# Patient Record
Sex: Female | Born: 1945 | Race: White | Hispanic: No | Smoking: Never smoker
Health system: Southern US, Community
[De-identification: ages and names within clinical notes are randomized; demographics above are authoritative.]

## PROBLEM LIST (undated history)

## (undated) DIAGNOSIS — K219 Gastro-esophageal reflux disease without esophagitis: Secondary | ICD-10-CM

## (undated) DIAGNOSIS — J309 Allergic rhinitis, unspecified: Secondary | ICD-10-CM

## (undated) DIAGNOSIS — N189 Chronic kidney disease, unspecified: Secondary | ICD-10-CM

## (undated) DIAGNOSIS — I890 Lymphedema, not elsewhere classified: Secondary | ICD-10-CM

## (undated) DIAGNOSIS — F32A Depression, unspecified: Secondary | ICD-10-CM

## (undated) DIAGNOSIS — Z860100 Personal history of colon polyps, unspecified: Secondary | ICD-10-CM

## (undated) DIAGNOSIS — E039 Hypothyroidism, unspecified: Secondary | ICD-10-CM

## (undated) DIAGNOSIS — I878 Other specified disorders of veins: Secondary | ICD-10-CM

## (undated) DIAGNOSIS — I10 Essential (primary) hypertension: Secondary | ICD-10-CM

## (undated) DIAGNOSIS — E785 Hyperlipidemia, unspecified: Secondary | ICD-10-CM

## (undated) DIAGNOSIS — M1990 Unspecified osteoarthritis, unspecified site: Secondary | ICD-10-CM

## (undated) DIAGNOSIS — E669 Obesity, unspecified: Secondary | ICD-10-CM

## (undated) DIAGNOSIS — Z8719 Personal history of other diseases of the digestive system: Secondary | ICD-10-CM

## (undated) HISTORY — PX: CHOLECYSTECTOMY: SHX55

## (undated) HISTORY — PX: ABDOMINAL HYSTERECTOMY: SHX81

## (undated) HISTORY — PX: JOINT REPLACEMENT: SHX530

## (undated) HISTORY — PX: EYE SURGERY: SHX253

## (undated) HISTORY — PX: TONSILLECTOMY: SUR1361

## (undated) HISTORY — PX: ANKLE ARTHROSCOPY: SUR85

## (undated) HISTORY — PX: TUBAL LIGATION: SHX77

## (undated) HISTORY — PX: OTHER SURGICAL HISTORY: SHX169

## (undated) HISTORY — PX: CARPAL TUNNEL RELEASE: SHX101

## (undated) HISTORY — PX: BACK SURGERY: SHX140

## (undated) HISTORY — PX: TOTAL SHOULDER REPLACEMENT: SUR1217

---

## 2005-03-11 ENCOUNTER — Ambulatory Visit: Payer: Self-pay | Admitting: *Deleted

## 2005-08-13 ENCOUNTER — Ambulatory Visit: Payer: Self-pay | Admitting: Internal Medicine

## 2006-09-15 ENCOUNTER — Ambulatory Visit: Payer: Self-pay | Admitting: Internal Medicine

## 2007-04-14 ENCOUNTER — Ambulatory Visit: Payer: Self-pay | Admitting: Gastroenterology

## 2008-04-14 ENCOUNTER — Ambulatory Visit: Payer: Self-pay | Admitting: Internal Medicine

## 2009-04-19 ENCOUNTER — Ambulatory Visit: Payer: Self-pay | Admitting: Internal Medicine

## 2009-04-26 ENCOUNTER — Ambulatory Visit: Payer: Self-pay | Admitting: Internal Medicine

## 2010-05-04 ENCOUNTER — Ambulatory Visit: Payer: Self-pay | Admitting: Physician Assistant

## 2010-05-18 ENCOUNTER — Ambulatory Visit: Payer: Self-pay | Admitting: Internal Medicine

## 2011-05-24 ENCOUNTER — Ambulatory Visit: Payer: Self-pay | Admitting: Internal Medicine

## 2011-06-19 ENCOUNTER — Ambulatory Visit: Payer: Self-pay | Admitting: Internal Medicine

## 2012-06-10 ENCOUNTER — Ambulatory Visit: Payer: Self-pay | Admitting: Internal Medicine

## 2013-05-13 ENCOUNTER — Ambulatory Visit: Payer: Self-pay | Admitting: Gastroenterology

## 2013-06-24 ENCOUNTER — Ambulatory Visit: Payer: Self-pay | Admitting: Internal Medicine

## 2014-07-18 ENCOUNTER — Ambulatory Visit: Payer: Self-pay | Admitting: Internal Medicine

## 2014-12-02 HISTORY — PX: EYE SURGERY: SHX253

## 2015-04-28 DIAGNOSIS — I890 Lymphedema, not elsewhere classified: Secondary | ICD-10-CM | POA: Insufficient documentation

## 2015-05-15 ENCOUNTER — Encounter: Payer: Self-pay | Admitting: Occupational Therapy

## 2015-05-15 ENCOUNTER — Ambulatory Visit: Payer: Medicare Other | Attending: Internal Medicine | Admitting: Occupational Therapy

## 2015-05-15 VITALS — Wt 217.0 lb

## 2015-05-15 DIAGNOSIS — I890 Lymphedema, not elsewhere classified: Secondary | ICD-10-CM

## 2015-05-15 NOTE — Therapy (Signed)
[facility] MAIN [facility] SERVICES [address] [place], [place], [address] Phone: [phone]   Fax:  [phone]  Occupational Therapy Evaluation  Patient Details  Name: Janet Galvan MRN: [id number] Date of Birth: 1946/03/05 Referring Provider:  Marguarite Arbour, MD  Encounter Date: 05/15/2015      OT End of Session - 05/15/15 1854    Visit Number 1   Number of Visits 36   Date for OT Re-Evaluation 08/13/15   OT Start Time 1300   OT Stop Time 1445   OT Time Calculation (min) 105 min   Activity Tolerance Patient tolerated treatment well   Behavior During Therapy [facility] for tasks assessed/performed      History reviewed. No pertinent past medical history.  History reviewed. No pertinent past surgical history.  Filed Vitals:   05/15/15 1331  Weight: 217 lb (98.431 kg)    Visit Diagnosis:  Lymphedema - Plan: Ot plan of care cert/re-cert      Subjective Assessment - 05/15/15 1846    Subjective  Pt reports onset of BLE sweling in Jan 2016 soon after her daughter passed away. Pt reports LLE swelling is worse than R. It limits her ability to complete basic and instrumental ADLs, limits functional moility, standing adn walking, and limits her participation in preferred social and community activities and leisure pursuits.   Patient Stated Goals  do the things I want to do.   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 stemmer is mildly positive on L. 2+ pitting noted at distal legs medially. no hemosiderine stain observed. No hypersendsativity to palpation   Edema   Edema mild, L>R          LYMPHEDEMA/ONCOLOGY QUESTIONNAIRE - 05/15/15 1852    What other symptoms do you have   Are you Having Heaviness or Tightness Yes   Are you having pitting edema Yes   Is it Hard or Difficult finding clothes that fit Yes   Stemmer Sign Yes   Lymphedema Stage   Stage STAGE 2 SPONTANEOUSLY IRREVERSIBLE                       OT Education - 05/15/15 1853    Education provided Yes   Education Details etiology, progression of lymphedema, treatment protocols and precautions   Person(s) Educated Patient   Methods Explanation;Demonstration;Tactile cues;Verbal cues;Handout   Comprehension Verbalized understanding;Verbal cues required;Need further instruction             OT Long Term Goals - 05/15/15 1906    OT LONG TERM GOAL #1   Title Pt independent and 100% with all Intensive/Management phase LE  self care protocols, including compression wrapping/ garment wear and care, lymphatic pumping ther ex, skin care, ther ex, by DC to limit infection risk , LE progression and further functional decline.   Baseline dependent   Time 12   Period Weeks   Status New   OT LONG TERM GOAL #2   Title Pt able to don/doff compression garments using assistive devices PRN on issue date, by DC to limit infection risk , LE progression and further functional decline.   Baseline dependent   Time 12   Period Weeks   OT LONG TERM GOAL #3   Title RLE limb volume reduction achieved by end of intensive as evidenced by 2 cm decrease at all calf landmarks.   Baseline dependent   Time 12   Period Weeks   Status New   OT LONG TERM GOAL #4   Title Skin  integrity to improve by palpable reduction in tissue density and resolution of pitting is medically necessary to limit infection risk and LE progression.   Baseline dependent   Time 12   Period Weeks   Status New   OT LONG TERM GOAL #5   Baseline dependent               Plan - 2015-06-02 1856    Clinical Impression Statement Pt presents with mild, stage 2 , BLELymphedema 2/2 suspected CVI and obesity, which limits B/IADL performance, social participation and community participation. Without skilled Occupational therapy for Complete Decongestive Therapy pt's condition is expected to worsen and progress leading to further functional decline.   Pt will benefit from skilled therapeutic intervention in order to improve on the following deficits (Retired) Decreased knowledge of precautions;Decreased knowledge of use of DME;Decreased mobility;Decreased skin integrity;Impaired perceived functional ability;Increased edema;Difficulty walking;Pain   Rehab Potential Good   OT Frequency 3x / week   OT Duration 12 weeks   OT Treatment/Interventions Self-care/ADL training;Compression bandaging;DME and/or AE instruction;Patient/family education;Other (comment);Therapeutic exercise;Manual Therapy;Manual lymph drainage   Plan fit with BLE custom flat knit Elavrex compression knee highs for daytime and consider BLE custom convoluted foam boots for HOS   OT Home Exercise Plan 2 x daily, 2 sets 10- all in sequence Lymphatic pumping   Consulted and Agree with Plan of Care Patient          G-Codes - 2015/06/02 1903    Functional Assessment Tool Used clinical observation, physical assessment, interview   Functional Limitation Self care   Self Care Current Status (W0981) At least 80 percent but less than 100 percent impaired, limited or restricted   Self Care Goal Status (X9147) At least 20 percent but less than 40 percent impaired, limited or restricted      Problem List There are no active problems to  display for this patient.  Loel Dubonnet, MS, OTR/L, CLT-LANA 2015/06/02 7:14 PM   02-Jun-2015, 7:14 PM  [place] [facility] MAIN [facility] SERVICES [address] [place], [place], [address] Phone: [phone]   Fax:  [phone]

## 2015-05-15 NOTE — Patient Instructions (Signed)
Preliminary Lymphedema Instructions/Precautions:  1. PRECAUTIONS: If you experience atypical shortness of breath, or notice any signs /symptoms of skin infection ( aka cellulitis) contact you physician immediately.  2. SKIN: Carefully monitor skin condition and perform impeccable hygiene daily. Bathe skin with mild soap and water and apply low pH lotion (aka Eucerin) to improve hydration and limit infection risk 3 x q d  3.Elevate your legs and feet to the level of your heart whenever you are sitting down.      

## 2015-05-17 ENCOUNTER — Ambulatory Visit: Payer: Medicare Other | Admitting: Occupational Therapy

## 2015-05-17 DIAGNOSIS — I890 Lymphedema, not elsewhere classified: Secondary | ICD-10-CM

## 2015-05-17 NOTE — Patient Instructions (Signed)
Lymphedema Care Instructions/Precautions  1. PRECAUTIONS: If you experience atypical shortness of breath, or notice any signs /symptoms of skin infection ( aka cellulitis) remove all compression wraps/ garments, discontinue manual lymphatic drainage (MLD),  and contact you physician immediately.  2. SKIN: Carefully monitor skin condition and perform impeccable hygiene daily. Bathe skin with mild soap and water and apply low pH lotion (aka Eucerin) to improve hydration and limit infection risk.  3. LYMPHATIC PUMPING EXERCISE: At least twice a day complete 2 sets of 10  of each exercise for each leg/arm. It's important to perform exercises in order. OMIT PARTIAL SIT UP  4. SELF MLD: Perform simple self MLD as directed daily.  5.COMPRESSION: Building tolerance may take time and practice, so don't get discouraged. Wraps are to be worn 23/7 during the Intensive Phase of lymphedema treatment, which is called Complete Decongestive Therapy (CDT). If bandages begin to feel tight during periods of inactivity and/or during the night, try performing your exercises to loosen them.   6. Elevate your legs and feet to the level of your heart whenever you are sitting down.          

## 2015-05-17 NOTE — Therapy (Signed)
[place] [facility] MAIN [facility] SERVICES [address] [place], [place], [address] Phone: [phone]   Fax:  [phone]  Occupational Therapy Treatment  Patient Details  Name: Janet Galvan MRN: [id number] Date of Birth: 01-25-1946 Referring Provider:  Marguarite Arbour, MD  Encounter Date: 05/17/2015      OT End of Session - 05/17/15 1722    Visit Number 2   Number of Visits 36   Date for OT Re-Evaluation 08/13/15   OT Start Time 1440   OT Stop Time 1555   OT Time Calculation (min) 75 min   Activity Tolerance Patient tolerated treatment well   Behavior During Therapy [facility] for tasks assessed/performed      No past medical history on file.  No past surgical history on file.  There were no vitals filed for this visit.  Visit Diagnosis:  Lymphedema      Subjective Assessment - 05/17/15 1707    Subjective  Pt presents for day 1 of Intensive Phase CDT to BLE w/ Rx commencing to LLE first.Pt is accompanied by her supportive ault daughter, Marchelle Folks, who states she will attend visits and assist w/ compression wrapping when she's able. Pt is eager to begin CDT.   Patient is accompained by: Family member   Currently in Pain? No/denies                      OT Treatments/Exercises (OP) - 05/17/15 0001    Manual Therapy   Manual Therapy Edema management;Manual Lymphatic Drainage (MLD);Compression Bandaging;Other (comment)   Manual therapy comments Completed  initial comparative BLE limb volumetrics and circumferential measurements   Compression Bandaging LLE knee length gradient wraps applied circumferentially: light toe wrapp, 10 cm x 1 Rosidol over stockinette from A to D, then short stretch (SS) LoPress 8 cm x 1 and  10 cm x 2  Ato D   Other Manual Therapy no skin care today, but discussed recommendation for low pH lotion and careful hygiene to limit infection risk                OT Education - 05/17/15 1720    Education  provided Yes   Education Details implemented skilled pt edu for LE self care components, including gradient compression wrapping and lymphatic pumping ther ex.   Person(s) Educated Patient;Child(ren)   Methods Explanation;Demonstration;Tactile cues;Verbal cues;Handout   Comprehension Verbalized understanding;Verbal cues required;Tactile cues required;Need further instruction             OT Long Term Goals - 05/15/15 1906    OT LONG TERM GOAL #1   Title Pt independent and 100% with all Intensive/Management phase LE self care protocols, including compression wrapping/ garment wear and care, lymphatic pumping ther ex, skin care, ther ex, by DC to limit infection risk , LE progression and further functional decline.   Baseline dependent   Time 12   Period Weeks   Status New   OT LONG TERM GOAL #2   Title Pt able to don/doff compression garments using assistive devices PRN on issue date, by DC to limit infection risk , LE progression and further functional decline.   Baseline dependent   Time 12   Period Weeks   OT LONG TERM GOAL #3   Title RLE limb volume reduction achieved by end of intensive as evidenced by 2 cm decrease at all calf landmarks.   Baseline dependent   Time 12   Period Weeks   Status New  OT LONG TERM GOAL #4   Title Skin integrity to improve by palpable reduction in tissue density and resolution of pitting is medically necessary to limit infection risk and LE progression.   Baseline dependent   Time 12   Period Weeks   Status New   OT LONG TERM GOAL #5   Baseline dependent               Plan - 05/17/15 1724    Clinical Impression Statement Pt tolerated OT session well today. Pt less tearful and able to focus on skilled edu for LE self care protocols with minimal redirection.    Pt will benefit from skilled therapeutic intervention in order to improve on the following deficits (Retired) Decreased knowledge of precautions;Decreased knowledge of use of  DME;Decreased mobility;Decreased skin integrity;Impaired perceived functional ability;Increased edema;Difficulty walking;Pain   Rehab Potential Good   OT Treatment/Interventions Self-care/ADL training;Compression bandaging;DME and/or AE instruction;Patient/family education;Other (comment);Therapeutic exercise;Manual Therapy;Manual lymph drainage   Plan Pt to commence self wrapping next session with waxing assistance until independent using proper techniques. reveiew LE ther ex and skin care protocols. Into simple self MLD stroke next visit if time allows.   OT Home Exercise Plan Review next visit- 2 sets 10 bilaterally 2 x  daily        Problem List There are no active problems to display for this patient.   Loel Dubonnet, MS, OTR/L, CLT-LANA 05/17/2015 5:33 PM  05/17/2015, 5:33 PM  [place] [facility] MAIN [facility] SERVICES [address] [place], [place], [address] Phone: [phone]   Fax:  [phone]

## 2015-05-19 ENCOUNTER — Ambulatory Visit: Payer: Medicare Other | Admitting: Occupational Therapy

## 2015-05-19 DIAGNOSIS — I890 Lymphedema, not elsewhere classified: Secondary | ICD-10-CM | POA: Diagnosis not present

## 2015-05-19 NOTE — Therapy (Signed)
[facility] MAIN [facility] SERVICES [address] [place], [place], [address] Phone: [phone]   Fax:  [phone]  Occupational Therapy Treatment  Patient Details  Name: Janet Galvan MRN: [id number] Date of Birth: July 14, 1946 Referring Provider:  Marguarite Arbour, MD  Encounter Date: 05/19/2015      OT End of Session - 05/19/15 1641    Visit Number 3   Number of Visits 36   Date for OT Re-Evaluation 08/13/15   OT Start Time 0235   OT Stop Time 0430   OT Time Calculation (min) 115 min   Activity Tolerance Patient tolerated treatment well   Behavior During Therapy [facility] for tasks assessed/performed      No past medical history on file.  No past surgical history on file.  There were no vitals filed for this visit.  Visit Diagnosis:  Lymphedema      Subjective Assessment - 05/19/15 1634    Subjective  Pt presents for day 2 of Intensive Phase CDT to BLE w/ Rx commencing to LLE first. Pt returns wearing knee length compression stockings. Pt states other than initial discomfort on top of her foot in wraps the first time, she tolerated compression without difficulty since last visit. Pt reports she removed wraps yesterday afternoon efore taking a shower.    Patient is accompained by: Family member   Patient Stated Goals  do the things I want to do.   Pain Onset More than a month ago                      OT Treatments/Exercises (OP) - 05/19/15 0001    ADLs   ADL Comments Pt reports she performed lymphatic pumping ther ex as directed during visit interval without difficulty. Commenced intro level Pt edu for self wrapping compression bandages today. Pt needed max assist to apply foam, artiflex and for wrap.   Manual Therapy   Manual Therapy Edema management;Manual Lymphatic Drainage (MLD);Compression Bandaging;Other (comment)  skin care w/ low pH Eucerin lotion before applying fresh wra   Manual Lymphatic Drainage (MLD)  Manual lymph drainage (MLD) in supine utilizing functional inguinal lymph nodes and deep abdominal lymphatics as is customary for non-cancer related lower extremity LE, including bilateral "short neck" sequence, deep abdominal pathways, functional inguinal LN, lower extremity proximal to distal w/ emphasis on medial knee bottleneck and politeal LN. Performed fibrosis technique to B maleoli and distal posterior leg to address fatty fibrosis. Good tolerance.   Compression Bandaging LLE knee length gradient wraps applied circumferentially: light toe wrapp, 10 cm x 1 Rosidol over stockinette from A to D, then short stretch (SS) LoPress 8 cm x 1 and  10 cm x 2  Ato D                OT Education - 05/19/15 1640    Education provided Yes   Education Details commenced edu for self compression wrapping. Pt needed max assist to apply first 2 wraps. Pt unable to appy toe wraps because she is unable to reach her toes.   Methods Explanation;Demonstration;Tactile cues;Verbal cues;Handout   Comprehension Verbalized understanding;Returned demonstration;Verbal cues required;Tactile cues required;Need further instruction             OT Long Term Goals - 05/15/15 1906    OT LONG TERM GOAL #1   Title Pt independent and 100% with all Intensive/Management phase LE self care protocols, including compression wrapping/ garment wear and care, lymphatic pumping ther  ex, skin care, ther ex, by DC to limit infection risk , LE progression and further functional decline.   Baseline dependent   Time 12   Period Weeks   Status New   OT LONG TERM GOAL #2   Title Pt able to don/doff compression garments using assistive devices PRN on issue date, by DC to limit infection risk , LE progression and further functional decline.   Baseline dependent   Time 12   Period Weeks   OT LONG TERM GOAL #3   Title RLE limb volume reduction achieved by end of intensive as evidenced by 2 cm decrease at all calf landmarks.    Baseline dependent   Time 12   Period Weeks   Status New   OT LONG TERM GOAL #4   Title Skin integrity to improve by palpable reduction in tissue density and resolution of pitting is medically necessary to limit infection risk and LE progression.   Baseline dependent   Time 12   Period Weeks   Status New   OT LONG TERM GOAL #5   Baseline dependent               Plan - 05/19/15 1642    Clinical Impression Statement To date Pt is tolerating all aspects of Intensive Phase Complete Decongestive Therapy (CDT) for Lymphedema (LE) care without difficulty, and She demonstrates progress towards swelling reduction, skin care and self care goals. Limb density skin tightness is palpably unchanged sine commencing .Congestion ifeels quite solid.  Pt is pleased with her progress so far and actively participates in all aspects of care.. Cont as per POC   Rehab Potential Good   Clinical Impairments Affecting Rehab Potential back pain-chronic   OT Treatment/Interventions Self-care/ADL training;Compression bandaging;DME and/or AE instruction;Patient/family education;Other (comment);Therapeutic exercise;Manual Therapy;Manual lymph drainage   OT Home Exercise Plan continue w/ wrwp edu next session        Problem List There are no active problems to display for this patient.   Loel Dubonnet, MS, OTR/L, CLT-LANA 05/19/2015 4:48 PM   05/19/2015, 4:48 PM  [place] [facility] MAIN [facility] SERVICES [address] [place], [place], [address] Phone: [phone]   Fax:  [phone]

## 2015-05-19 NOTE — Patient Instructions (Signed)
Lymphedema Care Instructions/Precautions  1. PRECAUTIONS: If you experience atypical shortness of breath, or notice any signs /symptoms of skin infection ( aka cellulitis) remove all compression wraps/ garments, discontinue manual lymphatic drainage (MLD),  and contact you physician immediately.  2. SKIN: Carefully monitor skin condition and perform impeccable hygiene daily. Bathe skin with mild soap and water and apply low pH lotion (aka Eucerin) to improve hydration and limit infection risk.  3. LYMPHATIC PUMPING EXERCISE: At least twice a day complete 2 sets of 10  of each exercise for each leg/arm. It's important to perform exercises in order. OMIT PARTIAL SIT UP  4. SELF MLD: Perform simple self MLD as directed daily.  5.COMPRESSION: Building tolerance may take time and practice, so don't get discouraged. Wraps are to be worn 23/7 during the Intensive Phase of lymphedema treatment, which is called Complete Decongestive Therapy (CDT). If bandages begin to feel tight during periods of inactivity and/or during the night, try performing your exercises to loosen them.   6. Elevate your legs and feet to the level of your heart whenever you are sitting down.          

## 2015-05-22 ENCOUNTER — Ambulatory Visit: Payer: Medicare Other | Admitting: Occupational Therapy

## 2015-05-22 DIAGNOSIS — I890 Lymphedema, not elsewhere classified: Secondary | ICD-10-CM

## 2015-05-22 NOTE — Patient Instructions (Signed)
1. Continue all LE self care protocols dailyt as directed.  2. When reapplying compression wrap between visits do not apply chip bag in order to give skin a break and limit bandage fatige.

## 2015-05-22 NOTE — Therapy (Signed)
[place] [facility] MAIN [facility] SERVICES [address] [place], [place], [address] Phone: [phone]   Fax:  [phone]  Occupational Therapy Treatment  Patient Details  Name: Janet Galvan MRN: [id number] Date of Birth: [birth year] Referring Provider:  Marguarite Arbour, MD  Encounter Date: 05/22/2015      OT End of Session - 05/22/15 1627    Visit Number 4   Number of Visits 36   Date for OT Re-Evaluation 08/13/15   OT Start Time 1438   OT Stop Time 1611   OT Time Calculation (min) 93 min   Activity Tolerance Patient tolerated treatment well   Behavior During Therapy [facility] for tasks assessed/performed      No past medical history on file.  No past surgical history on file.  There were no vitals filed for this visit.  Visit Diagnosis:  Lymphedema      Subjective Assessment - 05/22/15 1616    Subjective  Pt presents for day 3 of Intensive Phase CDT to BLE w/ Rx commencing to LLE first. Pt reports she had pretty good success wrapping her leg over the weekend.Pt has no new complaints.   Patient is accompained by: Family member   Patient Stated Goals  do the things I want to do.   Pain Onset More than a month ago                      OT Treatments/Exercises (OP) - 05/22/15 0001    ADLs   ADL Comments Pt able to apply skilled edu last week to apply gradient compression wraps and perform ther ex during visit interval. Encouraged Pt to keep wraps on 23/7 if possible for optimal outcome.   ADL Education Given Yes   Manual Therapy   Manual Therapy Edema management;Manual Lymphatic Drainage (MLD);Compression Bandaging;Other (comment)   Manual Lymphatic Drainage (MLD) Manual lymph drainage (MLD) in supine utilizing functional inguinal lymph nodes and deep abdominal lymphatics as is customary for non-cancer related lower extremity LE, including bilateral "short neck" sequence, deep abdominal pathways, functional inguinal LN, lower  extremity proximal to distal w/ emphasis on medial knee bottleneck and politeal LN. Performed fibrosis technique to B maleoli and distal posterior leg to address fatty fibrosis. Good tolerance.   Compression Bandaging LLE knee length gradient wraps applied circumferentially: light toe wrapp, 10 cm x 1 Rosidol over stockinette from A to D, then short stretch (SS) LoPress 8 cm x 1 and  10 cm x 2  Ato D. Added custom chip bag/ muff ( knee length) under typical wrap config today in effort to break down dense tissue density.   Other Manual Therapy skin care w/ low pH lotion to RLE                OT Education - 05/22/15 1625    Education provided Yes   Education Details Rational for chip bag for day time and muff at night to limit fibrosis in tissue to improve skin integrity.Handout provided review in prep for edu next session for simple self MLD. Continued compression wrap edu and trouble shooting for problems with wraps coming off over the weekend.   Person(s) Educated Patient   Methods Explanation;Demonstration;Tactile cues;Verbal cues;Handout   Comprehension Verbalized understanding;Returned demonstration;Verbal cues required;Tactile cues required;Need further instruction             OT Long Term Goals - 05/15/15 1906    OT LONG TERM GOAL #1   Title Pt  independent and 100% with all Intensive/Management phase LE self care protocols, including compression wrapping/ garment wear and care, lymphatic pumping ther ex, skin care, ther ex, by DC to limit infection risk , LE progression and further functional decline.   Baseline dependent   Time 12   Period Weeks   Status New   OT LONG TERM GOAL #2   Title Pt able to don/doff compression garments using assistive devices PRN on issue date, by DC to limit infection risk , LE progression and further functional decline.   Baseline dependent   Time 12   Period Weeks   OT LONG TERM GOAL #3   Title RLE limb volume reduction achieved by end of  intensive as evidenced by 2 cm decrease at all calf landmarks.   Baseline dependent   Time 12   Period Weeks   Status New   OT LONG TERM GOAL #4   Title Skin integrity to improve by palpable reduction in tissue density and resolution of pitting is medically necessary to limit infection risk and LE progression.   Baseline dependent   Time 12   Period Weeks   Status New   OT LONG TERM GOAL #5   Baseline dependent               Plan - 05/22/15 1628    Clinical Impression Statement Pt managing LE self care well between visits except for keeping wraps on 23/7 as directed. Pt didnt realize that was recommendation, so after we reviewed she agreed with that regime. Despite good compliance over all with all OT recommendations, tissue density remains quite hard , which limits lymph flow below knee. Pt may benefit from custom chip/bag/ muff every other session in an effort to create  high and low pressure  to facilitate improved fdecongestion and limit further obstructive fibrosis.   Pt will benefit from skilled therapeutic intervention in order to improve on the following deficits (Retired) Decreased knowledge of precautions;Decreased knowledge of use of DME;Decreased mobility;Decreased skin integrity;Impaired perceived functional ability;Increased edema;Difficulty walking;Pain   Rehab Potential Good   Clinical Impairments Affecting Rehab Potential back pain-chronic   OT Treatment/Interventions Self-care/ADL training;Compression bandaging;DME and/or AE instruction;Patient/family education;Other (comment);Therapeutic exercise;Manual Therapy;Manual lymph drainage   Plan Carefully monito skin condition each vsit and hold or DC muff at first sign of  skin irritation/ bandage fatique.        Problem List There are no active problems to display for this patient.   Loel Dubonnet, MS, OTR/L, [facility] 05/22/2015 4:36 PM   [place] [facility] MAIN [facility]  SERVICES [address] [place], [place], [address] Phone: [phone]   Fax:  [phone]

## 2015-05-24 ENCOUNTER — Ambulatory Visit: Payer: Medicare Other | Admitting: Occupational Therapy

## 2015-05-24 DIAGNOSIS — I890 Lymphedema, not elsewhere classified: Secondary | ICD-10-CM

## 2015-05-24 NOTE — Patient Instructions (Signed)
As established 

## 2015-05-24 NOTE — Therapy (Signed)
[place] [facility] MAIN [facility] SERVICES [address] [place], [place], [address] Phone: [phone]   Fax:  [phone]  Occupational Therapy Treatment  Patient Details  Name: Janet Galvan MRN: [id number] Date of Birth: Oct 14, 1946 Referring Provider:  Marguarite Arbour, MD  Encounter Date: 05/24/2015      OT End of Session - 05/24/15 1734    Visit Number 5   Number of Visits 36   Date for OT Re-Evaluation 08/13/15   OT Start Time 1436   OT Stop Time 1600   OT Time Calculation (min) 84 min   Activity Tolerance Patient tolerated treatment well   Behavior During Therapy [facility] for tasks assessed/performed      No past medical history on file.  No past surgical history on file.  There were no vitals filed for this visit.  Visit Diagnosis:  Lymphedema      Subjective Assessment - 05/24/15 1436    Subjective  Pt presents for day 4 of Intensive Phase CDT to BLE w/ Rx commencing to LLE first. Pt reports she had pretty good success wrapping her leg over the weekend.Pt has no new complaints today. "I tolerated that muff without any problems. My leg feels softer today."   Patient Stated Goals  do the things I want to do.   Currently in Pain? No/denies                      OT Treatments/Exercises (OP) - 05/24/15 0001    Manual Therapy   Manual Therapy (p) Edema management;Manual Lymphatic Drainage (MLD);Compression Bandaging;Other (comment)   Manual Lymphatic Drainage (MLD) (p) Manual lymph drainage (MLD) to LLE in supine utilizing functional inguinal lymph nodes and deep abdominal lymphatics as is customary for non-cancer related lower extremity LE, including bilateral "short neck" sequence, deep abdominal pathways, functional inguinal LN, lower extremity proximal to distal w/ emphasis on medial knee bottleneck and politeal LN. Performed fibrosis technique to B maleoli and distal posterior leg to address fatty fibrosis. Good tolerance.    Compression Bandaging (p) LLE knee length gradient wraps applied circumferentially: light toe wrapp, 10 cm x 1 Rosidol over stockinette from A to D, then short stretch (SS) LoPress 8 cm x 1 and  10 cm x 2  Ato D. Added custom chip bag/ muff ( knee length) under typical wrap config today in effort to break down dense tissue density.   Other Manual Therapy (p) skin care w/ low pH lotion to RLE                OT Education - 05/24/15 1733    Education provided Yes   Education Details Continued with Pt edu fo LE self care, including simple self MLD.   Person(s) Educated Patient   Methods Explanation;Demonstration;Tactile cues;Verbal cues   Comprehension Verbalized understanding;Returned demonstration;Need further instruction             OT Long Term Goals - 05/15/15 1906    OT LONG TERM GOAL #1   Title Pt independent and 100% with all Intensive/Management phase LE self care protocols, including compression wrapping/ garment wear and care, lymphatic pumping ther ex, skin care, ther ex, by DC to limit infection risk , LE progression and further functional decline.   Baseline dependent   Time 12   Period Weeks   Status New   OT LONG TERM GOAL #2   Title Pt able to don/doff compression garments using assistive devices PRN on issue  date, by DC to limit infection risk , LE progression and further functional decline.   Baseline dependent   Time 12   Period Weeks   OT LONG TERM GOAL #3   Title RLE limb volume reduction achieved by end of intensive as evidenced by 2 cm decrease at all calf landmarks.   Baseline dependent   Time 12   Period Weeks   Status New   OT LONG TERM GOAL #4   Title Skin integrity to improve by palpable reduction in tissue density and resolution of pitting is medically necessary to limit infection risk and LE progression.   Baseline dependent   Time 12   Period Weeks   Status New   OT LONG TERM GOAL #5   Baseline dependent               Plan  - 05/24/15 1735    Clinical Impression Statement Pt presented with softened RLE distal fibrosis today after 2 days with circumferential chip pad . Pt denied an difficulty tolerating it or discomfort. She agrees with plan to utilize on alternaing basis to limit skin fatique. Brawny edema with very tight skin remains stubborn and expect HOD device and custom flat knit garments will be required for optimal containment.,        Problem List There are no active problems to display for this patient.   Loel Dubonnet, MS, OTR/L, CLT-LANA 05/24/2015 5:39 PM    05/24/2015, 5:39 PM  [place] [facility] MAIN [facility] SERVICES [address] [place], [place], [address] Phone: [phone]   Fax:  [phone]

## 2015-05-26 ENCOUNTER — Ambulatory Visit: Payer: Medicare Other | Admitting: Occupational Therapy

## 2015-05-26 DIAGNOSIS — I890 Lymphedema, not elsewhere classified: Secondary | ICD-10-CM | POA: Diagnosis not present

## 2015-05-26 NOTE — Therapy (Signed)
[facility] MAIN [facility] SERVICES [address] [place], [place], [address] Phone: [phone]   Fax:  [phone]  Occupational Therapy Treatment  Patient Details  Name: Janet Galvan MRN: [id number] Date of Birth: 1946/09/29 Referring Provider:  Marguarite Arbour, MD  Encounter Date: 05/26/2015      OT End of Session - 05/26/15 1609    Visit Number 6   Number of Visits 36   Date for OT Re-Evaluation 08/13/15   OT Start Time 1430   OT Stop Time 1603   OT Time Calculation (min) 93 min   Activity Tolerance Patient tolerated treatment well   Behavior During Therapy [facility] for tasks assessed/performed      No past medical history on file.  No past surgical history on file.  There were no vitals filed for this visit.  Visit Diagnosis:  Lymphedema      Subjective Assessment - 05/26/15 1611    Subjective  Pt presents for day 6 of Intensive Phase CDT to BLE w/ Rx commencing to LLE first. Pt has no new complaints today. We discussed indications for HOS device to address dense fibrosis and pitting in  distal legs. Pt verbally Ok'd me loharitable donation of [employer] productes thru [employer] at [employer] into availability of    Patient is accompained by: Family member   Patient Stated Goals  do the things I want to do.   Pain Onset More than a month ago                      OT Treatments/Exercises (OP) - 05/26/15 0001    Manual Therapy   Manual Therapy Edema management;Manual Lymphatic Drainage (MLD);Compression Bandaging;Other (comment)   Manual Lymphatic Drainage (MLD) Manual lymph drainage (MLD) to LLE in supine utilizing functional inguinal lymph nodes and deep abdominal lymphatics as is customary for non-cancer related lower extremity LE, including bilateral "short neck" sequence, deep abdominal pathways, functional inguinal LN, lower extremity proximal to distal w/ emphasis on medial knee bottleneck and  politeal LN. Performed fibrosis technique to B maleoli and distal posterior leg to address fatty fibrosis. Good tolerance.   Compression Bandaging LLE knee length gradient wraps applied circumferentially: light toe wrapp, 10 cm x 1 Rosidol over stockinette from A to D, then short stretch (SS) LoPress 8 cm x 1 and  10 cm x 2  Ato D. Added custom chip bag/ muff ( knee length) under typical wrap config today in effort to break down dense tissue density.   Other Manual Therapy skin care w/ low pH lotion to RLE                OT Education - 05/26/15 1609    Education provided Yes   Education Details Cont Pt edu for self care throughout session   Methods Explanation   Comprehension Verbalized understanding             OT Long Term Goals - 05/15/15 1906    OT LONG TERM GOAL #1   Title Pt independent and 100% with all Intensive/Management phase LE self care protocols, including compression wrapping/ garment wear and care, lymphatic pumping ther ex, skin care, ther ex, by DC to limit infection risk , LE progression and further functional decline.   Baseline dependent   Time 12   Period Weeks   Status New   OT LONG TERM GOAL #2   Title Pt able to don/doff compression garments using  assistive devices PRN on issue date, by DC to limit infection risk , LE progression and further functional decline.   Baseline dependent   Time 12   Period Weeks   OT LONG TERM GOAL #3   Title RLE limb volume reduction achieved by end of intensive as evidenced by 2 cm decrease at all calf landmarks.   Baseline dependent   Time 12   Period Weeks   Status New   OT LONG TERM GOAL #4   Title Skin integrity to improve by palpable reduction in tissue density and resolution of pitting is medically necessary to limit infection risk and LE progression.   Baseline dependent   Time 12   Period Weeks   Status New   OT LONG TERM GOAL #5   Baseline dependent               Plan - 05/26/15 1611     Clinical Impression Statement Pt continues to make progress towards all OT goals for CDT. 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 Pt is compliant with all self care regimes thus far and is tolerating compression wraps well. Cont as per POC.   Rehab Potential Good   Plan contact [employer] [facility] at [employer] to explore availability of donated [employer].        Problem List There are no active problems to display for this patient.  Loel Dubonnet, MS, OTR/L, CLT-LANA 05/26/2015 4:14 PM      [name] 05/26/2015, 4:14 PM  [place] [facility] MAIN [facility] SERVICES [address] [place], [place], [address] Phone: [phone]   Fax:  [phone]

## 2015-05-26 NOTE — Patient Instructions (Signed)

## 2015-05-29 ENCOUNTER — Ambulatory Visit: Payer: Medicare Other | Admitting: Occupational Therapy

## 2015-05-29 DIAGNOSIS — I890 Lymphedema, not elsewhere classified: Secondary | ICD-10-CM

## 2015-05-29 NOTE — Patient Instructions (Signed)

## 2015-05-29 NOTE — Therapy (Signed)
[place] [facility] MAIN [facility] SERVICES [address] [place], [place], [address] Phone: [phone]   Fax:  [phone]  Occupational Therapy Treatment  Patient Details  Name: Janet Galvan MRN: [id number] Date of Birth: [birth year] Referring Provider:  Marguarite Arbour, MD  Encounter Date: 05/29/2015      OT End of Session - 05/29/15 1611    Visit Number 7   Number of Visits 36   Date for OT Re-Evaluation 08/13/15   OT Start Time 1435   OT Stop Time 1603   OT Time Calculation (min) 88 min   Activity Tolerance Patient tolerated treatment well   Behavior During Therapy [facility] for tasks assessed/performed      No past medical history on file.  No past surgical history on file.  There were no vitals filed for this visit.  Visit Diagnosis:  Lymphedema      Subjective Assessment - 05/29/15 1607    Subjective  Pt presents for visit 7 of Intensive Phase CDT to BLE w/ Rx commencing to LLE first. Pt presents without wraps in place after laundering  before the weekend. Pt has no new complaints today. We discussed indications for HOS device and possible resources for financial assistance.   Patient is accompained by: Family member   Patient Stated Goals  do the things I want to do.   Currently in Pain? No/denies   Pain Onset More than a month ago                      OT Treatments/Exercises (OP) - 05/29/15 0001    Manual Therapy   Manual Therapy Edema management;Manual Lymphatic Drainage (MLD);Compression Bandaging;Other (comment)   Manual Lymphatic Drainage (MLD) Manual lymph drainage (MLD) to LLE in supine utilizing functional inguinal lymph nodes and deep abdominal lymphatics as is customary for non-cancer related lower extremity LE, including bilateral "short neck" sequence, deep abdominal pathways, functional inguinal LN, lower extremity proximal to distal w/ emphasis on medial knee bottleneck and politeal LN. Performed fibrosis  technique to B maleoli and distal posterior leg to address fatty fibrosis. Good tolerance.   Compression Bandaging LLE knee length gradient wraps applied circumferentially: light toe wrapp, 10 cm x 1 Rosidol over stockinette from A to D, then short stretch (SS) LoPress 8 cm x 1 and  10 cm x 2  Ato D. Added custom chip bag/ muff ( knee length) under typical wrap config today in effort to break down dense tissue density.   Other Manual Therapy skin care w/ low pH lotion to BLE                OT Education - 05/29/15 1611    Education provided No             OT Long Term Goals - 05/15/15 1906    OT LONG TERM GOAL #1   Title Pt independent and 100% with all Intensive/Management phase LE self care protocols, including compression wrapping/ garment wear and care, lymphatic pumping ther ex, skin care, ther ex, by DC to limit infection risk , LE progression and further functional decline.   Baseline dependent   Time 12   Period Weeks   Status New   OT LONG TERM GOAL #2   Title Pt able to don/doff compression garments using assistive devices PRN on issue date, by DC to limit infection risk , LE progression and further functional decline.   Baseline dependent   Time 12  Period Weeks   OT LONG TERM GOAL #3   Title RLE limb volume reduction achieved by end of intensive as evidenced by 2 cm decrease at all calf landmarks.   Baseline dependent   Time 12   Period Weeks   Status New   OT LONG TERM GOAL #4   Title Skin integrity to improve by palpable reduction in tissue density and resolution of pitting is medically necessary to limit infection risk and LE progression.   Baseline dependent   Time 12   Period Weeks   Status New   OT LONG TERM GOAL #5   Baseline dependent               Plan - 05/29/15 1612    Clinical Impression Statement Pt comtinies to make progress towards all OT goals for LE care. Cont as per POC. Fit custom elvarex knee high asap and once fitted  commence Rx to RLE. Contionue toe explore charitable donations thru [employer] from [employer] [facility] and discounts through [employer].        Problem List There are no active problems to display for this patient.  Loel Dubonnet, MS, OTR/L, CLT-LANA 05/29/2015 4:16 PM    [name] 05/29/2015, 4:16 PM  [place] [facility] MAIN [facility] SERVICES [address] [place], [place], [address] Phone: [phone]   Fax:  [phone]

## 2015-05-31 ENCOUNTER — Ambulatory Visit: Payer: Medicare Other | Admitting: Occupational Therapy

## 2015-06-02 ENCOUNTER — Ambulatory Visit: Payer: Medicare Other | Attending: Internal Medicine | Admitting: Occupational Therapy

## 2015-06-02 DIAGNOSIS — I890 Lymphedema, not elsewhere classified: Secondary | ICD-10-CM | POA: Insufficient documentation

## 2015-06-02 NOTE — Therapy (Signed)
[facility] MAIN [facility] SERVICES [address] [place], [place], [address] Phone: [phone]   Fax:  [phone]  Occupational Therapy Treatment  Patient Details  Name: Janet Galvan MRN: [id number] Date of Birth: [birth year] Referring Provider:  Marguarite Arbour, MD  Encounter Date: 06/02/2015      OT End of Session - 06/02/15 1420    Visit Number 8   Number of Visits 36   Date for OT Re-Evaluation 08/13/15   OT Start Time 1037   OT Stop Time 1205   OT Time Calculation (min) 88 min   Activity Tolerance Patient tolerated treatment well   Behavior During Therapy [facility] for tasks assessed/performed      No past medical history on file.  No past surgical history on file.  There were no vitals filed for this visit.  Visit Diagnosis:  Lymphedema      Subjective Assessment - 06/02/15 1411    Subjective  Pt presents for visit 8 of Intensive Phase CDT to BLE w/ Rx commencing to LLE first. Pt presents without wraps in place. Pt states she is pleased with progress thus far and feels, "my legs are getting softer, less hard."   Patient is accompained by: Family member   Patient Stated Goals  do the things I want to do.   Currently in Pain? No/denies             LYMPHEDEMA/ONCOLOGY QUESTIONNAIRE - 06/02/15 1421    Right Lower Extremity Lymphedema   Other RLE limb volume A-G = 10819.667ml ( decreased from 11736.55 ml)   Other - RLE volume = 7.81%   Other limb volume differential (LVD) increased from 2.33%, L>R, to 4.74%, R>L   Left Lower Extremity Lymphedema   Other LLE limb volume A-G = 11332.12 mll ( decreased from 11463.42 ml)   Other LVR= 1.15%                 OT Treatments/Exercises (OP) - 06/02/15 0001    Manual Therapy   Manual Therapy Edema management;Manual Lymphatic Drainage (MLD);Compression Bandaging;Other (comment)   Manual therapy comments Comparative volumetrics completed today reveal 7.8% overall limb  volume reduction on the LLE from ankle to groin, and RLE reduction measuring 1.15%. LVD today measures 4.74%, R>L, up from initial LVD of 2.33%, R>L. Althou these data do not capture changes in lymph distributiion and appear to show a very minimal LVR,  a notable reduction is observed below the R knee of ~ 2 cm decrease in circumference at each landmark. A safe estimate for reduction be;low the knee would be ~15% at this stage of Intensive Rx.   Edema Management tissue hydration below the knees continues to improve. Redness is decreased mildly and , although still dense, tissue density is slightly decreased bilaterally below knees   Manual Lymphatic Drainage (MLD) Manual lymph drainage (MLD) to LLE in supine utilizing functional inguinal lymph nodes and deep abdominal lymphatics as is customary for non-cancer related lower extremity LE, including bilateral "short neck" sequence, deep abdominal pathways, functional inguinal LN, lower extremity proximal to distal w/ emphasis on medial knee bottleneck and politeal LN. Performed fibrosis technique to B maleoli and distal posterior leg to address fatty fibrosis. Good tolerance.   Compression Bandaging LLE knee length gradient wraps applied circumferentially: light toe wrapp, 10 cm x 1 Rosidol over stockinette from A to D, then short stretch (SS) LoPress 8 cm x 1 and  10 cm x 2  Ato D. Added  custom chip bag/ muff ( knee length) under typical wrap config today in effort to break down dense tissue density.   Other Manual Therapy skin care w/ low pH lotion to BLE                OT Education - 06/02/15 1419    Education provided Yes   Education Details interpretation of limb volumetrics   Person(s) Educated Patient   Methods Explanation   Comprehension Verbalized understanding;Need further instruction             OT Long Term Goals - 05/15/15 1906    OT LONG TERM GOAL #1   Title Pt independent and 100% with all Intensive/Management phase LE  self care protocols, including compression wrapping/ garment wear and care, lymphatic pumping ther ex, skin care, ther ex, by DC to limit infection risk , LE progression and further functional decline.   Baseline dependent   Time 12   Period Weeks   Status New   OT LONG TERM GOAL #2   Title Pt able to don/doff compression garments using assistive devices PRN on issue date, by DC to limit infection risk , LE progression and further functional decline.   Baseline dependent   Time 12   Period Weeks   OT LONG TERM GOAL #3   Title RLE limb volume reduction achieved by end of intensive as evidenced by 2 cm decrease at all calf landmarks.   Baseline dependent   Time 12   Period Weeks   Status New   OT LONG TERM GOAL #4   Title Skin integrity to improve by palpable reduction in tissue density and resolution of pitting is medically necessary to limit infection risk and LE progression.   Baseline dependent   Time 12   Period Weeks   Status New   OT LONG TERM GOAL #5   Baseline dependent               Plan - 06/02/15 1428    Clinical Impression Statement Pt tolerating OT for CDT without difficulty and demonstrates steady progress towards all goals. Limb volumetrics reveal a 7.81% limb volume reduction on current treatment leg (LLE) and 1.15% preliminary reduction on RLE, which we haven't wrapped to date.). Skin hydration is mildly improved, redness is decreasing, and tissue density below the knees is slightly less dense to palpation. Pt is compliant with all self care regimes.   Plan cont as per POC. Consider garment options next week and research co and scheduling with vendor.   Consulted and Agree with Plan of Care Patient        Problem List There are no active problems to display for this patient. Janet Dubonnetheresa Hyun Reali, MS, OTR/L, CLT-LANA 06/02/2015 2:37 PM     Janet Galvan 06/02/2015, 2:37 PM  [place] [facility] MAIN [facility] [address] [place], [place], [address] Phone: [phone]   Fax:  [phone]

## 2015-06-02 NOTE — Patient Instructions (Signed)
As established 

## 2015-06-09 ENCOUNTER — Ambulatory Visit: Payer: Medicare Other | Admitting: Occupational Therapy

## 2015-06-09 DIAGNOSIS — I890 Lymphedema, not elsewhere classified: Secondary | ICD-10-CM

## 2015-06-09 NOTE — Patient Instructions (Signed)
As established 

## 2015-06-09 NOTE — Therapy (Signed)
[facility] [facility] MAIN [facility] SERVICES [address] [place], [place], [address] Phone: [phone]   Fax:  [phone]  Occupational Therapy Treatment  Patient Details  Name: Janet Galvan MRN: [id number] Date of Birth: Dec 03, 1945 Referring Provider:  Marguarite Arbour, MD  Encounter Date: 06/09/2015      OT End of Session - 06/09/15 1304    Visit Number 9   Number of Visits 36   Date for OT Re-Evaluation 08/13/15   OT Start Time 1302   OT Stop Time 1437   OT Time Calculation (min) 95 min      No past medical history on file.  No past surgical history on file.  There were no vitals filed for this visit.  Visit Diagnosis:  Lymphedema      Subjective Assessment - 06/09/15 1304    Subjective  Pt presents for visit 9 of Intensive Phase CDT to BLE w/ Rx commencing to LLE first. Pt presents without wraps in place. Pt reports she wrapped every day except for one during her holiday. "It was so hot they felt like they were burning and I had to take a break. I kept my feet up the whole time.   Patient Stated Goals  do the things I want to do.   Currently in Pain? No/denies                      OT Treatments/Exercises (OP) - 06/09/15 0001    Manual Therapy   Manual Therapy Edema management;Manual Lymphatic Drainage (MLD);Compression Bandaging;Other (comment)                OT Education - 06/09/15 1442    Education provided Yes   Education Details discussed custom flat knit compression garment/ device recommendations and why these are clinically indicated in her case, vs circular knit, OTS options.    Person(s) Educated Patient   Methods Explanation   Comprehension Need further instruction             OT Long Term Goals - 05/15/15 1906    OT LONG TERM GOAL #1   Title Pt independent and 100% with all Intensive/Management phase LE self care protocols, including compression wrapping/ garment wear and care,  lymphatic pumping ther ex, skin care, ther ex, by DC to limit infection risk , LE progression and further functional decline.   Baseline dependent   Time 12   Period Weeks   Status New   OT LONG TERM GOAL #2   Title Pt able to don/doff compression garments using assistive devices PRN on issue date, by DC to limit infection risk , LE progression and further functional decline.   Baseline dependent   Time 12   Period Weeks   OT LONG TERM GOAL #3   Title RLE limb volume reduction achieved by end of intensive as evidenced by 2 cm decrease at all calf landmarks.   Baseline dependent   Time 12   Period Weeks   Status New   OT LONG TERM GOAL #4   Title Skin integrity to improve by palpable reduction in tissue density and resolution of pitting is medically necessary to limit infection risk and LE progression.   Baseline dependent   Time 12   Period Weeks   Status New   OT LONG TERM GOAL #5   Baseline dependent               Plan - 06/09/15 1443  Clinical Impression Statement Dense pitting edema noted bilaterally below knees today after a weeks break in CDT 2/2 Pt's oliday vacation. Extreme hot outdoor temperatures are typically a significant exacerbating factor. Pt presents with mildly increased tenderness at RLE today. Syuspect this  increased sensory sensativity in more decongested limb.         Problem List There are no active problems to display for this patient.  Loel Dubonnetheresa Jayd Cadieux, MS, OTR/L, CLT-LANA 06/09/2015 2:47 PM    Janet Galvan 06/09/2015, 2:47 PM  [place] [facility] [address] [place], [place], [address] Phone: [phone]   Fax:  [phone]

## 2015-06-12 ENCOUNTER — Ambulatory Visit: Payer: Medicare Other | Admitting: Occupational Therapy

## 2015-06-12 DIAGNOSIS — I890 Lymphedema, not elsewhere classified: Secondary | ICD-10-CM | POA: Diagnosis not present

## 2015-06-12 NOTE — Patient Instructions (Signed)
As established 

## 2015-06-12 NOTE — Therapy (Signed)
[facility] MAIN [facility] SERVICES [address] [place], [place], [address] Phone: [phone]   Fax:  [phone]  Occupational Therapy Treatment & Progress Note  Patient Details  Name: Janet Galvan MRN: [id number] Date of Birth: 05/22/1946 Referring Provider:  Marguarite Arbour, MD  Encounter Date: 06/12/2015      OT End of Session - 06/12/15 1629    Visit Number 10   Number of Visits 36   Date for OT Re-Evaluation 08/13/15   OT Start Time 1432   OT Stop Time 1600   OT Time Calculation (min) 88 min   Activity Tolerance Patient tolerated treatment well   Behavior During Therapy [facility] for tasks assessed/performed      No past medical history on file.  No past surgical history on file.  There were no vitals filed for this visit.  Visit Diagnosis:  Lymphedema      Subjective Assessment - 06/12/15 1626    Subjective  Pt presents for visit 10 CDT to BLE without wraps in place 2/2 laundering bandages. Pt managing quite well between clinical appointments. RLE ready for measurement despite very stubborn LE and slow reduction in limb volume.   Currently in Pain? No/denies                      OT Treatments/Exercises (OP) - 06/12/15 0001    Manual Therapy   Manual Therapy Edema management;Manual Lymphatic Drainage (MLD);Compression Bandaging;Other (comment)   Edema Management tissue hydration below the knees continues to improve. Redness is decreased mildly and , although still dense, tissue density is slightly decreased bilaterally below knees   Manual Lymphatic Drainage (MLD) Manual lymph drainage (MLD) to LLE in supine utilizing functional inguinal lymph nodes and deep abdominal lymphatics as is customary for non-cancer related lower extremity LE, including bilateral "short neck" sequence, deep abdominal pathways, functional inguinal LN, lower extremity proximal to distal w/ emphasis on medial knee bottleneck and politeal  LN. Performed fibrosis technique to B maleoli and distal posterior leg to address fatty fibrosis. Good tolerance.   Compression Bandaging LLE knee length gradient wraps applied circumferentially: light toe wrapp, 10 cm x 1 Rosidol over stockinette from A to D, then short stretch (SS) LoPress 8 cm x 1 and  10 cm x 2  Ato D. Added custom chip bag/ muff ( knee length) under typical wrap config today in effort to break down dense tissue density.   Other Manual Therapy skin care w/ low pH lotion to BLE                OT Education - 06/12/15 1628    Education provided Yes   Education Details continued discussiproperties and looked at sampleson of compression garment    Person(s) Educated Patient   Methods Explanation;Demonstration   Comprehension Verbalized understanding;Need further instruction             OT Long Term Goals - 05/15/15 1906    OT LONG TERM GOAL #1   Title Pt independent and 100% with all Intensive/Management phase LE self care protocols, including compression wrapping/ garment wear and care, lymphatic pumping ther ex, skin care, ther ex, by DC to limit infection risk , LE progression and further functional decline.   Baseline dependent   Time 12   Period Weeks   Status New   OT LONG TERM GOAL #2   Title Pt able to don/doff compression garments using assistive devices PRN on issue date,  by DC to limit infection risk , LE progression and further functional decline.   Baseline dependent   Time 12   Period Weeks   OT LONG TERM GOAL #3   Title RLE limb volume reduction achieved by end of intensive as evidenced by 2 cm decrease at all calf landmarks.   Baseline dependent   Time 12   Period Weeks   Status New   OT LONG TERM GOAL #4   Title Skin integrity to improve by palpable reduction in tissue density and resolution of pitting is medically necessary to limit infection risk and LE progression.   Baseline dependent   Time 12   Period Weeks   Status New   OT  LONG TERM GOAL #5   Baseline dependent               Plan - 06/12/15 1630    Clinical Impression Statement To date Pt is tolerating all aspects of Intensive Phase Complete Decongestive Therapy (CDT) for stage II, BLE Lymphedema (LE) without difficulty. She demonstrates slow but steady progress towards OT goals including diligent compliance w/ self care protocols,  limb volume reductions in RLE rx limb , mproved skin condition, and decreased pain/ discomfort. She actively participates in all aspects of clinical sessions.   Pt will benefit from skilled therapeutic intervention in order to improve on the following deficits (Retired) Decreased knowledge of precautions;Decreased knowledge of use of DME;Decreased mobility;Decreased skin integrity;Impaired perceived functional ability;Increased edema;Difficulty walking;Pain   Rehab Potential --   Clinical Impairments Affecting Rehab Potential back pain-chronic   OT Treatment/Interventions Self-care/ADL training;Compression bandaging;DME and/or AE instruction;Patient/family education;Other (comment);Therapeutic exercise;Manual Therapy;Manual lymph drainage   Plan Cont as per POC. Comlete anatomical measurements for custom RLE knee length ccl 3 compression garment and adjustable convoluted foam HOS device ASAP. Then commence LLE CDT.   OT Home Exercise Plan continue w/ wrwp edu next session          G-Codes - 06/12/15 1634    Functional Assessment Tool Used clinical observation, comparative limb volumetrics, interview   Functional Limitation Self care   Self Care Current Status ([id number]) At least 60 percent but less than 80 percent impaired, limited or restricted   Self Care Goal Status (U0454(G8988) At least 20 percent but less than 40 percent impaired, limited or restricted      Problem List There are no active problems to display for this patient.  Loel Dubonnetheresa Shoshanna Mcquitty, MS, OTR/L, CLT-LANA 06/12/2015 4:37 PM  Judithann Saugerheresa L Chamari Cutbirth 06/12/2015, 4:37  PM  [facility] [address] [place], [place], [address] Phone: [phone]   Fax:  [phone]

## 2015-06-14 ENCOUNTER — Ambulatory Visit: Payer: Medicare Other | Admitting: Occupational Therapy

## 2015-06-16 ENCOUNTER — Ambulatory Visit: Payer: Medicare Other | Admitting: Occupational Therapy

## 2015-06-19 ENCOUNTER — Ambulatory Visit: Payer: Medicare Other | Admitting: Occupational Therapy

## 2015-06-19 DIAGNOSIS — I890 Lymphedema, not elsewhere classified: Secondary | ICD-10-CM

## 2015-06-19 NOTE — Therapy (Signed)
[place] [facility] MAIN [facility] SERVICES [address] [place], [place], [address] Phone: [phone]   Fax:  [phone]  Occupational Therapy Treatment  Patient Details  Name: Janet Galvan MRN: [id number] Date of Birth: [birth year] Referring Provider:  Marguarite Arbour, MD  Encounter Date: 06/19/2015      OT End of Session - 06/19/15 1448    Visit Number 11   Number of Visits 36   Date for OT Re-Evaluation 08/13/15   OT Start Time 1300   OT Stop Time 1430   OT Time Calculation (min) 90 min   Activity Tolerance Patient tolerated treatment well   Behavior During Therapy [facility] for tasks assessed/performed      No past medical history on file.  No past surgical history on file.  There were no vitals filed for this visit.  Visit Diagnosis:  Lymphedema      Subjective Assessment - 06/19/15 1300    Subjective  Pt presents for visit 11 CDT to BLE without wraps in place 2/2 laundering bandages. Pt managing quite well between clinical appointments.Pt reports vendor completed LLE garment measurements last week. She did not give her an ETA for fitting.   Patient Stated Goals  do the things I want to do.   Currently in Pain? No/denies                      OT Treatments/Exercises (OP) - 06/19/15 0001    Manual Therapy   Manual Therapy Edema management;Manual Lymphatic Drainage (MLD);Compression Bandaging;Other (comment)   Edema Management tissue hydration below the knees continues to improve. Redness is decreased mildly and , although still dense, tissue density is slightly decreased bilaterally below knees   Manual Lymphatic Drainage (MLD) Manual lymph drainage (MLD) to LLE in supine utilizing functional inguinal lymph nodes and deep abdominal lymphatics as is customary for non-cancer related lower extremity LE, including bilateral "short neck" sequence, deep abdominal pathways, functional inguinal LN, lower extremity proximal to  distal w/ emphasis on medial knee bottleneck and politeal LN. Performed fibrosis technique to B maleoli and distal posterior leg to address fatty fibrosis. Good tolerance.   Compression Bandaging LLE knee length gradient wraps applied circumferentially: light toe wrapp, 10 cm x 1 Rosidol over stockinette from A to D, then short stretch (SS) LoPress 8 cm x 1 and  10 cm x 2  Ato D. Added custom chip bag/ muff ( knee length) under typical wrap config today in effort to break down dense tissue density.   Other Manual Therapy skin care w/ low pH lotion to BLE                OT Education - 06/19/15 1447    Education provided Yes   Education Details continued Pt education/ ADL training for all LE self care protogols   Person(s) Educated Patient   Methods Explanation;Demonstration   Comprehension Verbalized understanding;Need further instruction             OT Long Term Goals - 05/15/15 1906    OT LONG TERM GOAL #1   Title Pt independent and 100% with all Intensive/Management phase LE self care protocols, including compression wrapping/ garment wear and care, lymphatic pumping ther ex, skin care, ther ex, by DC to limit infection risk , LE progression and further functional decline.   Baseline dependent   Time 12   Period Weeks   Status New   OT LONG TERM GOAL #2  Title Pt able to don/doff compression garments using assistive devices PRN on issue date, by DC to limit infection risk , LE progression and further functional decline.   Baseline dependent   Time 12   Period Weeks   OT LONG TERM GOAL #3   Title RLE limb volume reduction achieved by end of intensive as evidenced by 2 cm decrease at all calf landmarks.   Baseline dependent   Time 12   Period Weeks   Status New   OT LONG TERM GOAL #4   Title Skin integrity to improve by palpable reduction in tissue density and resolution of pitting is medically necessary to limit infection risk and LE progression.   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 Pt continues to benefit from CDT , including daily compression to decrease swelling. reduce infection risk, and limit progression. Vendor completed measurements for LLE custom compression knee high last week. Once fitting is complete we'll commence RLE wrapping and CDT.   Pt will benefit from skilled therapeutic intervention in order to improve on the following deficits (Retired) Decreased knowledge of precautions;Decreased knowledge of use of DME;Decreased mobility;Decreased skin integrity;Impaired perceived functional ability;Increased edema;Difficulty walking;Pain   Rehab Potential Good   OT Treatment/Interventions Self-care/ADL training;Compression bandaging;DME and/or AE instruction;Patient/family education;Other (comment);Therapeutic exercise;Manual Therapy;Manual lymph drainage        Problem List There are no active problems to display for this patient.  Janet Dubonnetheresa Gisela Lea, MS, OTR/L, CLT-LANA 06/19/2015 2:52 PM   Janet Saugerheresa L Nikole Galvan 06/19/2015, 2:52 PM  [place] [facility] [address] [place], [place], [address] Phone: [phone]   Fax:  [phone]

## 2015-06-21 ENCOUNTER — Ambulatory Visit: Payer: Medicare Other | Admitting: Occupational Therapy

## 2015-06-23 ENCOUNTER — Ambulatory Visit: Payer: Medicare Other | Admitting: Occupational Therapy

## 2015-06-23 DIAGNOSIS — I890 Lymphedema, not elsewhere classified: Secondary | ICD-10-CM

## 2015-06-23 NOTE — Therapy (Signed)
[facility] MAIN [facility] SERVICES [address] [place], [place], [address] Phone: [phone]   Fax:  [phone]  Occupational Therapy Treatment  Patient Details  Name: Janet Galvan MRN: [id number] Date of Birth: 05-Oct-1946 Referring Provider:  Marguarite Arbour, MD  Encounter Date: 06/23/2015      OT End of Session - 06/23/15 1515    Visit Number 12   Number of Visits 36   Date for OT Re-Evaluation 08/13/15   OT Start Time 1302   OT Stop Time 1445   OT Time Calculation (min) 103 min   Activity Tolerance Patient limited by pain   Behavior During Therapy [facility] for tasks assessed/performed      No past medical history on file.  No past surgical history on file.  There were no vitals filed for this visit.  Visit Diagnosis:  Lymphedema      Subjective Assessment - 06/23/15 1515    Subjective  Pt presents for visit 12 CDT to BLE without wraps in place 2/2 laundering bandages. Pt managing quite well between clinical appointments.Pt reports she ordered compression garments over the phone this morning.   Patient is accompained by: Family member   Patient Stated Goals  do the things I want to do.   Pain Onset More than a month ago                                   OT Long Term Goals - 05/15/15 1906    OT LONG TERM GOAL #1   Title Pt independent and 100% with all Intensive/Management phase LE self care protocols, including compression wrapping/ garment wear and care, lymphatic pumping ther ex, skin care, ther ex, by DC to limit infection risk , LE progression and further functional decline.   Baseline dependent   Time 12   Period Weeks   Status New   OT LONG TERM GOAL #2   Title Pt able to don/doff compression garments using assistive devices PRN on issue date, by DC to limit infection risk , LE progression and further functional decline.   Baseline dependent   Time 12   Period Weeks   OT LONG TERM GOAL  #3   Title RLE limb volume reduction achieved by end of intensive as evidenced by 2 cm decrease at all calf landmarks.   Baseline dependent   Time 12   Period Weeks   Status New   OT LONG TERM GOAL #4   Title Skin integrity to improve by palpable reduction in tissue density and resolution of pitting is medically necessary to limit infection risk and LE progression.   Baseline dependent   Time 12   Period Weeks   Status New   OT LONG TERM GOAL #5   Baseline dependent               Plan - 06/23/15 1516    Clinical Impression Statement Pt continues to demonstrate progess towards OT goals for LE self care. Pt manages condition well between sessions, despite very stubborn , persistent brawny edema which has been slow to respond to Rx with hoped for volume reduction and tissue softening. Cont as per POC. Fit custom garments/ devices ASAP and commenct CDT to RLE.   Rehab Potential Good   OT Treatment/Interventions Self-care/ADL training;Compression bandaging;DME and/or AE instruction;Patient/family education;Other (comment);Therapeutic exercise;Manual Therapy;Manual lymph drainage        Problem  List There are no active problems to display for this patient.  Loel Dubonnet, MS, OTR/L, CLT-LANA 06/23/2015 3:31 PM   [name] 06/23/2015, 3:31 PM  [facility] MAIN [facility] SERVICES [address] [place], [place], [address] Phone: [phone]   Fax:  [phone]

## 2015-06-26 ENCOUNTER — Ambulatory Visit: Payer: Medicare Other | Admitting: Occupational Therapy

## 2015-06-26 DIAGNOSIS — I890 Lymphedema, not elsewhere classified: Secondary | ICD-10-CM

## 2015-06-27 NOTE — Therapy (Signed)
[place] MAIN [facility] SERVICES [address] [place], [place], [address] Phone: [phone]   Fax:  [phone]  Occupational Therapy Treatment  Patient Details  Name: Janet Galvan MRN: [id number] Date of Birth: 22-Mar-1946 Referring Provider:  Marguarite Arbour, MD  Encounter Date: 06/26/2015      OT End of Session - 06/27/15 0956    Visit Number 13   Number of Visits 36   Date for OT Re-Evaluation 08/13/15   OT Start Time 1350   OT Stop Time 1450   OT Time Calculation (min) 60 min   Activity Tolerance Patient tolerated treatment well   Behavior During Therapy [facility] for tasks assessed/performed      No past medical history on file.  No past surgical history on file.  There were no vitals filed for this visit.  Visit Diagnosis:  Lymphedema      Subjective Assessment - 06/27/15 0953    Subjective  Pt presents for visit 13 CDT to BLE without wraps in place 2/2 laundering bandages. Pt managing quite well between clinical appointments.Pt has no new complaints today. She reports less leg pain/ discomfort since commencing OT.   Patient Stated Goals  do the things I want to do.   Currently in Pain? No/denies                      OT Treatments/Exercises (OP) - 06/27/15 0001    Manual Therapy   Manual Therapy Edema management;Manual Lymphatic Drainage (MLD);Compression Bandaging   Edema Management tissue hydration below the knees continues to improve. Redness is decreased mildly and , although still dense, tissue density is slightly decreased bilaterally below knees   Manual Lymphatic Drainage (MLD) Manual lymph drainage (MLD) to LLE in supine utilizing functional inguinal lymph nodes and deep abdominal lymphatics as is customary for non-cancer related lower extremity LE, including bilateral "short neck" sequence, deep abdominal pathways, functional inguinal LN, lower extremity proximal to distal w/ emphasis on medial knee  bottleneck and politeal LN. Performed fibrosis technique to B maleoli and distal posterior leg to address fatty fibrosis. Good tolerance.   Compression Bandaging LLE knee length gradient wraps applied circumferentially: light toe wrapp, 10 cm x 1 Rosidol over stockinette from A to D, then short stretch (SS) LoPress 8 cm x 1 and  10 cm x 2  Ato D. Added custom chip bag/ muff ( knee length) under typical wrap config today in effort to break down dense tissue density.   Other Manual Therapy skin care w/ low pH lotion to BLE                OT Education - 06/27/15 0955    Education provided Yes   Education Details continued LE self care ADL training throuougt Rx session   Person(s) Educated Patient   Methods Explanation;Demonstration   Comprehension Verbalized understanding;Need further instruction             OT Long Term Goals - 05/15/15 1906    OT LONG TERM GOAL #1   Title Pt independent and 100% with all Intensive/Management phase LE self care protocols, including compression wrapping/ garment wear and care, lymphatic pumping ther ex, skin care, ther ex, by DC to limit infection risk , LE progression and further functional decline.   Baseline dependent   Time 12   Period Weeks   Status New   OT LONG TERM GOAL #2   Title Pt able to don/doff compression  garments using assistive devices PRN on issue date, by DC to limit infection risk , LE progression and further functional decline.   Baseline dependent   Time 12   Period Weeks   OT LONG TERM GOAL #3   Title RLE limb volume reduction achieved by end of intensive as evidenced by 2 cm decrease at all calf landmarks.   Baseline dependent   Time 12   Period Weeks   Status New   OT LONG TERM GOAL #4   Title Skin integrity to improve by palpable reduction in tissue density and resolution of pitting is medically necessary to limit infection risk and LE progression.   Baseline dependent   Time 12   Period Weeks   Status New    OT LONG TERM GOAL #5   Baseline dependent               Plan - 06/27/15 0958    Clinical Impression Statement Pt continues to demonstrate progress towards OT goals for CDT. Without ongoing CDT Pt's condition is expected to worsen with increased disability. Cont as per POC. Fit w/ LLE compression garment asap ( on order) and commence RLE CDT.   Pt will benefit from skilled therapeutic intervention in order to improve on the following deficits (Retired) Decreased knowledge of precautions;Decreased knowledge of use of DME;Decreased mobility;Decreased skin integrity;Impaired perceived functional ability;Increased edema;Difficulty walking;Pain   Rehab Potential Good   Clinical Impairments Affecting Rehab Potential back pain-chronic   OT Treatment/Interventions Self-care/ADL training;Compression bandaging;DME and/or AE instruction;Patient/family education;Other (comment);Therapeutic exercise;Manual Therapy;Manual lymph drainage        Problem List There are no active problems to display for this patient.  Loel Dubonnet, MS, OTR/L, CLT-LANA 06/27/2015 10:01 AM   [name] 06/27/2015, 10:01 AM  [facility] MAIN [facility] SERVICES [address] [place], [place], [address] Phone: [phone]   Fax:  [phone]

## 2015-06-27 NOTE — Patient Instructions (Signed)
As established 

## 2015-06-28 ENCOUNTER — Ambulatory Visit: Payer: Medicare Other | Admitting: Occupational Therapy

## 2015-06-28 DIAGNOSIS — I890 Lymphedema, not elsewhere classified: Secondary | ICD-10-CM

## 2015-06-28 NOTE — Therapy (Signed)
[facility] MAIN [facility] SERVICES [address] [place], [place], [address] Phone: [phone]   Fax:  [phone]  Occupational Therapy Treatment  Patient Details  Name: Janet Galvan MRN: [id number] Date of Birth: 07-12-1946 Referring Provider:  Marguarite Arbour, MD  Encounter Date: 06/28/2015      OT End of Session - 06/28/15 1637    Visit Number 14   Number of Visits 36   Date for OT Re-Evaluation 08/13/15   OT Start Time 1437   OT Stop Time 1600   OT Time Calculation (min) 83 min   Activity Tolerance Patient tolerated treatment well   Behavior During Therapy [name] for tasks assessed/performed      No past medical history on file.  No past surgical history on file.  There were no vitals filed for this visit.  Visit Diagnosis:  Lymphedema      Subjective Assessment - 06/28/15 1635    Subjective  Pt presents for visit 14 CDT to BLE without wraps in place 2/2 laundering bandages. Pt managing quite well between clinical appointments.Pt has no new complaints today. She reports less leg pain/ discomfort since commencing OT.   Patient Stated Goals  do the things I want to do.   Currently in Pain? No/denies   Pain Onset More than a month ago                      OT Treatments/Exercises (OP) - 06/28/15 0001    Manual Therapy   Manual Therapy Edema management;Manual Lymphatic Drainage (MLD);Compression Bandaging   Manual Lymphatic Drainage (MLD) Manual lymph drainage (MLD) to LLE in supine utilizing functional inguinal lymph nodes and deep abdominal lymphatics as is customary for non-cancer related lower extremity LE, including bilateral "short neck" sequence, deep abdominal pathways, functional inguinal LN, lower extremity proximal to distal w/ emphasis on medial knee bottleneck and politeal LN. Performed fibrosis technique to B maleoli and distal posterior leg to address fatty fibrosis. Good tolerance.   Compression  Bandaging LLE knee length gradient wraps applied circumferentially: light toe wrapp, 10 cm x 1 Rosidol over stockinette from A to D, then short stretch (SS) LoPress 8 cm x 1 and  10 cm x 2  Ato D. Added custom chip bag/ muff ( knee length) under typical wrap config today in effort to break down dense tissue density.   Other Manual Therapy skin care w/ low pH lotion to BLE                OT Education - 06/28/15 1636    Education provided No             OT Long Term Goals - 05/15/15 1906    OT LONG TERM GOAL #1   Title Pt independent and 100% with all Intensive/Management phase LE self care protocols, including compression wrapping/ garment wear and care, lymphatic pumping ther ex, skin care, ther ex, by DC to limit infection risk , LE progression and further functional decline.   Baseline dependent   Time 12   Period Weeks   Status New   OT LONG TERM GOAL #2   Title Pt able to don/doff compression garments using assistive devices PRN on issue date, by DC to limit infection risk , LE progression and further functional decline.   Baseline dependent   Time 12   Period Weeks   OT LONG TERM GOAL #3   Title RLE limb volume reduction achieved by  end of intensive as evidenced by 2 cm decrease at all calf landmarks.   Baseline dependent   Time 12   Period Weeks   Status New   OT LONG TERM GOAL #4   Title Skin integrity to improve by palpable reduction in tissue density and resolution of pitting is medically necessary to limit infection risk and LE progression.   Baseline dependent   Time 12   Period Weeks   Status New   OT LONG TERM GOAL #5   Baseline dependent               Plan - 06/28/15 1638    Clinical Impression Statement Pt tolerating OT very well. She is diligent with LE self care between sessions and totally engaged during treatment session. [name] LE continues to be stubborn and slow to respond to CDT, but slow and steady progress continues. Cont OT as  per POC.   Pt will benefit from skilled therapeutic intervention in order to improve on the following deficits (Retired) Decreased knowledge of precautions;Decreased knowledge of use of DME;Decreased mobility;Decreased skin integrity;Impaired perceived functional ability;Increased edema;Difficulty walking;Pain   Rehab Potential Good   OT Treatment/Interventions Self-care/ADL training;Compression bandaging;DME and/or AE instruction;Patient/family education;Other (comment);Therapeutic exercise;Manual Therapy;Manual lymph drainage        Problem List There are no active problems to display for this patient.  Loel Dubonnet, MS, OTR/L, CLT-LANA 06/28/2015 4:42 PM  [name] 06/28/2015, 4:42 PM  [facility] MAIN [facility] SERVICES [address] [place], [place], [address] Phone: [phone]   Fax:  [phone]

## 2015-06-28 NOTE — Patient Instructions (Signed)
As established 

## 2015-06-30 ENCOUNTER — Ambulatory Visit: Payer: Medicare Other | Admitting: Occupational Therapy

## 2015-06-30 DIAGNOSIS — I890 Lymphedema, not elsewhere classified: Secondary | ICD-10-CM

## 2015-06-30 NOTE — Patient Instructions (Signed)

## 2015-06-30 NOTE — Therapy (Signed)
[place] [facility] MAIN [facility] SERVICES [address] [place], [place], [address] Phone: [phone]   Fax:  [phone]  Occupational Therapy Treatment  Patient Details  Name: Janet Galvan MRN: [id number] Date of Birth: 11/15/1946 Referring Provider:  Marguarite Arbour, MD  Encounter Date: 06/30/2015      OT End of Session - 06/30/15 1456    Visit Number 16   Number of Visits 36   Date for OT Re-Evaluation 08/13/15   OT Start Time 1230   OT Stop Time 1400   OT Time Calculation (min) 90 min   Equipment Utilized During Treatment rubber gloves, friction mat, juzo slippie      No past medical history on file.  No past surgical history on file.  There were no vitals filed for this visit.  Visit Diagnosis:  Lymphedema      Subjective Assessment - 06/30/15 1447    Subjective  Pt presents for visit 16 CDT to BLE. DME vendor arrived late in session to issue / fit LLE custom compression garment.    Patient is accompained by: Family member   Patient Stated Goals  do the things I want to do.   Currently in Pain? No/denies   Pain Onset More than a month ago             LYMPHEDEMA/ONCOLOGY QUESTIONNAIRE - 06/30/15 1458    Left Lower Extremity Lymphedema   Other LLE volume 11,271,94   Other LLE decreased by 4.18% since last measured on 05/03/15.Marland Kitchen Overall LVR below knee measures 16.8%, which meets 10% goals and excedes it by 6.8%.                 OT Treatments/Exercises (OP) - 06/30/15 0001    ADLs   ADL Education Given Yes   General Comments Pt verbalized understanding of compression garment wear and care instructions, including proper laundry regime. Pt able to don / doff LLE custom knee high with modified independence and extra time and verbal cues using adaptive equipment after skilled edu   Manual Therapy   Manual Therapy Edema management;Manual Lymphatic Drainage (MLD)   Manual Lymphatic Drainage (MLD) Manual lymph  drainage (MLD) to LLE in supine utilizing functional inguinal lymph nodes and deep abdominal lymphatics as is customary for non-cancer related lower extremity LE, including bilateral "short neck" sequence, deep abdominal pathways, functional inguinal LN, lower extremity proximal to distal w/ emphasis on medial knee bottleneck and politeal LN. Performed fibrosis technique to B maleoli and distal posterior leg to address fatty fibrosis. Good tolerance.   Compression Bandaging Custom ccl Elvarex knee high appears to fit and function well upon preliminary assessment. Pt will wash and wear as instructed during visit interval . Vendor will order remake w/ open toe and replace ASAP.   Other Manual Therapy skin care w/ low pH lotion to BLE                OT Education - 06/30/15 1458    Education provided Yes             OT Long Term Goals - 05/15/15 1906    OT LONG TERM GOAL #1   Title Pt independent and 100% with all Intensive/Management phase LE self care protocols, including compression wrapping/ garment wear and care, lymphatic pumping ther ex, skin care, ther ex, by DC to limit infection risk , LE progression and further functional decline.   Baseline dependent   Time 12   Period Weeks  Status New   OT LONG TERM GOAL #2   Title Pt able to don/doff compression garments using assistive devices PRN on issue date, by DC to limit infection risk , LE progression and further functional decline.   Baseline dependent   Time 12   Period Weeks   OT LONG TERM GOAL #3   Title RLE limb volume reduction achieved by end of intensive as evidenced by 2 cm decrease at all calf landmarks.   Baseline dependent   Time 12   Period Weeks   Status New   OT LONG TERM GOAL #4   Title Skin integrity to improve by palpable reduction in tissue density and resolution of pitting is medically necessary to limit infection risk and LE progression.   Baseline dependent   Time 12   Period Weeks   Status New    OT LONG TERM GOAL #5   Baseline dependent               Plan - 06/30/15 1500    Clinical Impression Statement LLE comaprative volumetrics measured and calculated today reveal that LLE from ankle to groin s decreased by an additional 4.18% since last measured on 06/02/15.Marland Kitchen Overall limb vollume reduction below knee on LLE measures 16.8%, which meets 10% volumetric goal and excedes it by 6.8%. New custom, cclonpression knee hih fit today. Pt reports comfort in garment. Upon initial assessment it appears to fit well.  Pt agrees to wash and wear it during visit interval and report back on garment  next week. DME vendor will order a replacement with open toe as order and we'll continue skilled edu for donning and doffing using proper ergonomic positioning, and assitive devices,    Pt will benefit from skilled therapeutic intervention in order to improve on the following deficits (Retired) Decreased knowledge of precautions;Decreased knowledge of use of DME;Decreased mobility;Decreased skin integrity;Impaired perceived functional ability;Increased edema;Difficulty walking;Pain   Rehab Potential Good   Clinical Impairments Affecting Rehab Potential back pain-chronic   OT Treatment/Interventions Self-care/ADL training;Compression bandaging;DME and/or AE instruction;Patient/family education;Other (comment);Therapeutic exercise;Manual Therapy;Manual lymph drainage   Plan 1. Finalize assessment of LLE garment next week. 2. Contact DME manufacturer to explore charitable donation for [facility] device (Reid sleeve) 3. Commence Intensive Phase CDT to RLE AD and ft w/ same garment ASAP.   OT Home Exercise Plan continue w/ wrwp edu next session   Consulted and Agree with Plan of Care Patient        Problem List There are no active problems to display for this patient.   [name] 06/30/2015, 3:11 PM  [place] [facility] MAIN [facility] SERVICES [address] [place],  [place], [address] Phone: [phone]   Fax:  [phone]

## 2015-07-03 ENCOUNTER — Ambulatory Visit: Payer: Medicare Other | Attending: Internal Medicine | Admitting: Occupational Therapy

## 2015-07-03 DIAGNOSIS — I890 Lymphedema, not elsewhere classified: Secondary | ICD-10-CM | POA: Insufficient documentation

## 2015-07-03 NOTE — Patient Instructions (Signed)
As established 

## 2015-07-03 NOTE — Therapy (Signed)
[place] [facility] MAIN [facility] SERVICES [address] [place], [place], [address] Phone: [phone]   Fax:  [phone]  Occupational Therapy Treatment  Patient Details  Name: Janet Galvan MRN: [id number] Date of Birth: 04-Apr-1946 Referring Provider:  Marguarite Arbour, MD  Encounter Date: 07/03/2015      OT End of Session - 07/03/15 1637    Visit Number 17   Number of Visits 36   Date for OT Re-Evaluation 08/13/15   OT Start Time 1437   OT Stop Time 1559   OT Time Calculation (min) 82 min   Equipment Utilized During Treatment rubber gloves, friction mat, juzo slippie   Activity Tolerance Patient tolerated treatment well;No increased pain   Behavior During Therapy [facility] for tasks assessed/performed      No past medical history on file.  No past surgical history on file.  There were no vitals filed for this visit.  Visit Diagnosis:  Lymphedema      Subjective Assessment - 07/03/15 1621    Subjective  Pt presents for visit 17 CDT to BLE. Pt reporting new LLE compression garment is fitting well and is very comfortable. She is now able to don and doff using rubber non slip gloves after a weekend of practive using techniques learned in skilled edu. Containment of garment appears to manage swelling very well at this stage.   Patient Stated Goals  do the things I want to do.   Currently in Pain? No/denies             LYMPHEDEMA/ONCOLOGY QUESTIONNAIRE - 07/03/15 1627    Right Lower Extremity Lymphedema   Other RLE AG volume =12329.64 ml ( increased by 7.02% since initially measured on 05/15/15. AD limb volume= 4518.79, increased by 1.9% since last measured on 7/1, and 2.7% overall.increased    Left Lower Extremity Lymphedema   Other not measured   Other AG LVD= 8.58%. AD LVD= 1.37%                 OT Treatments/Exercises (OP) - 07/03/15 0001    ADLs   ADL Education Given Yes   General Comments Pt now independent with skin  care, ther ex , compression wrapping and garment wear and care.   Manual Therapy   Manual Therapy Edema management;Manual Lymphatic Drainage (MLD);Compression Bandaging   Manual therapy comments commenced MLD to RLE today. Completed comparative volumetrics to RLE only looking at past and present limb volumes below the knee as well as from toes to groin. RLE is increased in volume today and Pt reporting increased discomfort in leg   Edema Management hydration WNL bLy. RLE is shiney with skin stretched tight over 3+ pitting edema from disatl leg to dorsal foot. No signs/ symptoms of infection.   Manual Lymphatic Drainage (MLD) Manual lymph drainage (MLD) to RLE in supine utilizing functional inguinal lymph nodes and deep abdominal lymphatics as is customary for non-cancer related lower extremity LE, including bilateral "short neck" sequence, deep abdominal pathways, functional inguinal LN, lower extremity proximal to distal w/ emphasis on medial knee bottleneck and politeal LN. Performed fibrosis technique to B maleoli and distal posterior leg to address fatty fibrosis. Good tolerance.   Compression Bandaging RLE wraps applied as established for LLE   Other Manual Therapy skin care w/ low pH lotion to BLE                OT Education - 07/03/15 1637    Education provided No  OT Long Term Goals - 05/15/15 1906    OT LONG TERM GOAL #1   Title Pt independent and 100% with all Intensive/Management phase LE self care protocols, including compression wrapping/ garment wear and care, lymphatic pumping ther ex, skin care, ther ex, by DC to limit infection risk , LE progression and further functional decline.   Baseline dependent   Time 12   Period Weeks   Status New   OT LONG TERM GOAL #2   Title Pt able to don/doff compression garments using assistive devices PRN on issue date, by DC to limit infection risk , LE progression and further functional decline.   Baseline dependent    Time 12   Period Weeks   OT LONG TERM GOAL #3   Title RLE limb volume reduction achieved by end of intensive as evidenced by 2 cm decrease at all calf landmarks.   Baseline dependent   Time 12   Period Weeks   Status New   OT LONG TERM GOAL #4   Title Skin integrity to improve by palpable reduction in tissue density and resolution of pitting is medically necessary to limit infection risk and LE progression.   Baseline dependent   Time 12   Period Weeks   Status New   OT LONG TERM GOAL #5   Baseline dependent               Plan - 07/03/15 1638    Clinical Impression Statement LLE compression wrap containing LE very well. Pt is able to don and doff w/ friction gloves between sessions. Pt diligent w/ all LE self care.RComparative RLE limb volumetrics measured and calculated today reveal an in crease in toes to groin  (A-G) volume of 7.03% since initially measured on 05/15/15. Since last measured on 7/1 RLE AG volume is up by 2.7%. RLE below the knee (AD) volume is increased by 1.95% since 7/1 and by 2.7% overall.   Pt will benefit from skilled therapeutic intervention in order to improve on the following deficits (Retired) Decreased knowledge of precautions;Decreased knowledge of use of DME;Decreased mobility;Decreased skin integrity;Impaired perceived functional ability;Increased edema;Difficulty walking;Pain   Rehab Potential Good   Clinical Impairments Affecting Rehab Potential Cont OT as per POC. Emphasis shifts to RLE CDT today. Will continue to monitor LLE carefully and encourage ongoing diligent self care.   OT Treatment/Interventions Self-care/ADL training;Compression bandaging;DME and/or AE instruction;Patient/family education;Other (comment);Therapeutic exercise;Manual Therapy;Manual lymph drainage        Problem List There are no active problems to display for this patient. Loel Dubonnet, MS, OTR/L, CLT-LANA 07/03/2015 4:44 PM   [name] 07/03/2015, 4:44  PM  [place] [facility] MAIN [facility] SERVICES [address] [place], [place], [address] Phone: [phone]   Fax:  [phone]

## 2015-07-05 ENCOUNTER — Ambulatory Visit: Payer: Medicare Other | Admitting: Occupational Therapy

## 2015-07-05 DIAGNOSIS — I890 Lymphedema, not elsewhere classified: Secondary | ICD-10-CM | POA: Diagnosis not present

## 2015-07-05 NOTE — Patient Instructions (Signed)
As established 

## 2015-07-05 NOTE — Therapy (Signed)
[place] [facility] MAIN [facility] SERVICES [address] [place], [place], [address] Phone: [phone]   Fax:  [phone]  Occupational Therapy Treatment  Patient Details  Name: Janet Galvan MRN: [id number] Date of Birth: 04-04-1946 Referring Provider:  Marguarite Arbour, MD  Encounter Date: 07/05/2015      OT End of Session - 07/05/15 1443    Visit Number 18   Number of Visits 36   Date for OT Re-Evaluation 08/13/15   OT Start Time 1300   OT Stop Time 1435   OT Time Calculation (min) 95 min   Activity Tolerance Patient tolerated treatment well;No increased pain   Behavior During Therapy [facility] for tasks assessed/performed      No past medical history on file.  No past surgical history on file.  There were no vitals filed for this visit.  Visit Diagnosis:  Lymphedema      Subjective Assessment - 07/05/15 1312    Subjective  (p) Pt presents for visit 17 CDT to BLE. Pt reporting new LLE compression garment is fitting well and is very comfortable. She is now able to don and doff using rubber non slip gloves after a weekend of practive using techniques learned in skilled edu. Containment of garment appears to manage swelling very well at this stage.                      OT Treatments/Exercises (OP) - 07/05/15 0001    Manual Therapy   Manual Therapy Edema management;Manual Lymphatic Drainage (MLD);Compression Bandaging   Manual Lymphatic Drainage (MLD) Manual lymph drainage (MLD) to RLE in supine utilizing functional inguinal lymph nodes and deep abdominal lymphatics as is customary for non-cancer related lower extremity LE, including bilateral "short neck" sequence, deep abdominal pathways, functional inguinal LN, lower extremity proximal to distal w/ emphasis on medial knee bottleneck and politeal LN. Performed fibrosis technique to B maleoli and distal posterior leg to address fatty fibrosis. Good tolerance.   Compression  Bandaging RLE wraps applied as established for LLE   Other Manual Therapy skin care w/ low pH lotion to BLE                OT Education - 07/05/15 1443    Education provided No             OT Long Term Goals - 05/15/15 1906    OT LONG TERM GOAL #1   Title Pt independent and 100% with all Intensive/Management phase LE self care protocols, including compression wrapping/ garment wear and care, lymphatic pumping ther ex, skin care, ther ex, by DC to limit infection risk , LE progression and further functional decline.   Baseline dependent   Time 12   Period Weeks   Status New   OT LONG TERM GOAL #2   Title Pt able to don/doff compression garments using assistive devices PRN on issue date, by DC to limit infection risk , LE progression and further functional decline.   Baseline dependent   Time 12   Period Weeks   OT LONG TERM GOAL #3   Title RLE limb volume reduction achieved by end of intensive as evidenced by 2 cm decrease at all calf landmarks.   Baseline dependent   Time 12   Period Weeks   Status New   OT LONG TERM GOAL #4   Title Skin integrity to improve by palpable reduction in tissue density and resolution of pitting is medically necessary to  limit infection risk and LE progression.   Baseline dependent   Time 12   Period Weeks   Status New   OT LONG TERM GOAL #5   Baseline dependent               Plan - 07/05/15 1601    Clinical Impression Statement Custom compression garment on LLE continues to fit and function well. Pt is quite pleased with fit and comfort.Marland Kitchen RLE limb volume and tissue density are visually and palpably reduced since last visit. Pt having increased tenderness in calf and distal leg w/ decongestion, but has gotten progressively better as the day goes on. Pt tolerating treatment well. Cont as per POC.   Pt will benefit from skilled therapeutic intervention in order to improve on the following deficits (Retired) Decreased knowledge of  precautions;Decreased knowledge of use of DME;Decreased mobility;Decreased skin integrity;Impaired perceived functional ability;Increased edema;Difficulty walking;Pain   Rehab Potential Good   Clinical Impairments Affecting Rehab Potential Cont OT as per POC. Emphasis shifts to RLE CDT today. Will continue to monitor LLE carefully and encourage ongoing diligent self care.   OT Treatment/Interventions Self-care/ADL training;Compression bandaging;DME and/or AE instruction;Patient/family education;Other (comment);Therapeutic exercise;Manual Therapy;Manual lymph drainage   Consulted and Agree with Plan of Care Patient        Problem List There are no active problems to display for this patient.  Loel Dubonnet, MS, OTR/L, CLT-LANA 07/05/2015 4:07 PM  [name] 07/05/2015, 4:07 PM  [place] [facility] MAIN [facility] SERVICES [address] [place], [place], [address] Phone: [phone]   Fax:  [phone]

## 2015-07-06 ENCOUNTER — Other Ambulatory Visit: Payer: Self-pay | Admitting: Internal Medicine

## 2015-07-06 DIAGNOSIS — Z1231 Encounter for screening mammogram for malignant neoplasm of breast: Secondary | ICD-10-CM

## 2015-07-07 ENCOUNTER — Ambulatory Visit: Payer: Medicare Other | Admitting: Occupational Therapy

## 2015-07-07 DIAGNOSIS — I890 Lymphedema, not elsewhere classified: Secondary | ICD-10-CM

## 2015-07-07 NOTE — Therapy (Signed)
[place] [facility] MAIN [facility] SERVICES [address] [place], [place], [address] Phone: [phone]   Fax:  [phone]  Occupational Therapy Treatment  Patient Details  Name: Janet Galvan MRN: [id number] Date of Birth: Apr 19, 1946 Referring Provider:  Marguarite Arbour, MD  Encounter Date: 07/07/2015      OT End of Session - 07/07/15 1627    Visit Number 19   Number of Visits 36   Date for OT Re-Evaluation 08/13/15   OT Start Time 1305   OT Stop Time 1431   OT Time Calculation (min) 86 min   Activity Tolerance Patient tolerated treatment well;No increased pain   Behavior During Therapy [facility] for tasks assessed/performed      No past medical history on file.  No past surgical history on file.  There were no vitals filed for this visit.  Visit Diagnosis:  Lymphedema      Subjective Assessment - 07/07/15 1623    Subjective  Pt presents for visit 18 CDT to BLE. Pt has no new complaints today. Discussed decreasing visit frequency to 1 x weekly after next week and while waiting for RLE garment. I expect we;ll be ready to complete RLE garments at the end of next week.   Patient Stated Goals  do the things I want to do.   Currently in Pain? No/denies                      OT Treatments/Exercises (OP) - 07/07/15 0001    ADLs   ADL Education Given Yes   General Comments see Pt EDU   Manual Therapy   Manual Therapy Edema management   Manual Lymphatic Drainage (MLD) Manual lymph drainage (MLD) to RLE in supine utilizing functional inguinal lymph nodes and deep abdominal lymphatics as is customary for non-cancer related lower extremity LE, including bilateral "short neck" sequence, deep abdominal pathways, functional inguinal LN, lower extremity proximal to distal w/ emphasis on medial knee bottleneck and politeal LN. Performed fibrosis technique to B maleoli and distal posterior leg to address fatty fibrosis. Good tolerance.   Compression Bandaging RLE wraps applied as established for LLE   Other Manual Therapy skin care w/ low pH lotion to BLE                OT Education - 07/07/15 1626    Education provided Yes   Education Details Cont Pt edu for LE self care throughout session.   Person(s) Educated Patient   Methods Explanation;Demonstration;Tactile cues;Verbal cues   Comprehension Verbalized understanding;Returned demonstration;Need further instruction             OT Long Term Goals - 05/15/15 1906    OT LONG TERM GOAL #1   Title Pt independent and 100% with all Intensive/Management phase LE self care protocols, including compression wrapping/ garment wear and care, lymphatic pumping ther ex, skin care, ther ex, by DC to limit infection risk , LE progression and further functional decline.   Baseline dependent   Time 12   Period Weeks   Status New   OT LONG TERM GOAL #2   Title Pt able to don/doff compression garments using assistive devices PRN on issue date, by DC to limit infection risk , LE progression and further functional decline.   Baseline dependent   Time 12   Period Weeks   OT LONG TERM GOAL #3   Title RLE limb volume reduction achieved by end of intensive as evidenced by 2  cm decrease at all calf landmarks.   Baseline dependent   Time 12   Period Weeks   Status New   OT LONG TERM GOAL #4   Title Skin integrity to improve by palpable reduction in tissue density and resolution of pitting is medically necessary to limit infection risk and LE progression.   Baseline dependent   Time 12   Period Weeks   Status New   OT LONG TERM GOAL #5   Baseline dependent               Plan - 07/07/15 1628    Clinical Impression Statement Cont as per POC. Pt demonstrating RLE limb volume reduction and tissue density softening with  more rapid respose than LLE thus far. Pt agrees w/ plan to decrease OT frequency to 2 x weekly after next week when we hope to complete RLE garment  measurements. Once foitting  is complete we'll decrease to 1 x wk x 2  weeks and PRN, then schedule 1 month f/u.   Pt will benefit from skilled therapeutic intervention in order to improve on the following deficits (Retired) Decreased knowledge of precautions;Decreased knowledge of use of DME;Decreased mobility;Decreased skin integrity;Impaired perceived functional ability;Increased edema;Difficulty walking;Pain   Rehab Potential Good   Clinical Impairments Affecting Rehab Potential Cont OT as per POC. Emphasis shifts to RLE CDT today. Will continue to monitor LLE carefully and encourage ongoing diligent self care.   OT Treatment/Interventions Self-care/ADL training;Compression bandaging;DME and/or AE instruction;Patient/family education;Other (comment);Therapeutic exercise;Manual Therapy;Manual lymph drainage        Problem List There are no active problems to display for this patient. Loel Dubonnet, MS, OTR/L, CLT-LANA 07/07/2015 4:32 PM   [name] 07/07/2015, 4:32 PM  [place] [facility] MAIN [facility] SERVICES [address] [place], [place], [address] Phone: [phone]   Fax:  [phone]

## 2015-07-07 NOTE — Patient Instructions (Signed)
As established 

## 2015-07-10 ENCOUNTER — Ambulatory Visit: Payer: Medicare Other | Admitting: Occupational Therapy

## 2015-07-10 DIAGNOSIS — I890 Lymphedema, not elsewhere classified: Secondary | ICD-10-CM

## 2015-07-10 NOTE — Therapy (Signed)
[place] [facility] MAIN [facility] SERVICES [address] [place], [place], [address] Phone: [phone]   Fax:  [phone]  Occupational Therapy Treatment  Patient Details  Name: Janet Galvan MRN: [id number] Date of Birth: 10-May-1946 Referring Provider:  Marguarite Arbour, MD  Encounter Date: 07/10/2015      OT End of Session - 07/10/15 1620    Visit Number 19   Number of Visits 36   Date for OT Re-Evaluation 08/13/15   OT Start Time 1302   OT Stop Time 1428   OT Time Calculation (min) 86 min   Equipment Utilized During Treatment complression wraps and supplies   Activity Tolerance Patient tolerated treatment well;No increased pain   Behavior During Therapy [facility] for tasks assessed/performed      No past medical history on file.  No past surgical history on file.  There were no vitals filed for this visit.  Visit Diagnosis:  Lymphedema      Subjective Assessment - 07/10/15 1616    Subjective  Pt presents for visit 19 CDT to BLE. Pt states, "The bandages really don;t bother me that much, but the cast shoe is so hard it kills my feet."    Patient Stated Goals  do the things I want to do.   Currently in Pain? No/denies                      OT Treatments/Exercises (OP) - 07/10/15 0001    Manual Therapy   Manual Therapy Edema management;Manual Lymphatic Drainage (MLD);Compression Bandaging  anatomical measurements for BLE HOS devices   Manual Lymphatic Drainage (MLD) Manual lymph drainage (MLD) to RLE in supine utilizing functional inguinal lymph nodes and deep abdominal lymphatics as is customary for non-cancer related lower extremity LE, including bilateral "short neck" sequence, deep abdominal pathways, functional inguinal LN, lower extremity proximal to distal w/ emphasis on medial knee bottleneck and politeal LN. Performed fibrosis technique to B maleoli and distal posterior leg to address fatty fibrosis. Good tolerance.   Compression Bandaging RLE wraps applied as established for LLE   Other Manual Therapy skin care w/ low pH lotion to BLE                OT Education - 07/10/15 1619    Education provided Yes   Education Details Skilled Pt edu and ADL training for LE self care: compression, skin care , ther ex, simple self MLD, compression garment and device options ans recommendations.   Person(s) Educated Patient   Methods Explanation;Demonstration;Tactile cues;Verbal cues;Handout   Comprehension Verbalized understanding;Need further instruction             OT Long Term Goals - 05/15/15 1906    OT LONG TERM GOAL #1   Title Pt independent and 100% with all Intensive/Management phase LE self care protocols, including compression wrapping/ garment wear and care, lymphatic pumping ther ex, skin care, ther ex, by DC to limit infection risk , LE progression and further functional decline.   Baseline dependent   Time 12   Period Weeks   Status New   OT LONG TERM GOAL #2   Title Pt able to don/doff compression garments using assistive devices PRN on issue date, by DC to limit infection risk , LE progression and further functional decline.   Baseline dependent   Time 12   Period Weeks   OT LONG TERM GOAL #3   Title RLE limb volume reduction achieved by end of  intensive as evidenced by 2 cm decrease at all calf landmarks.   Baseline dependent   Time 12   Period Weeks   Status New   OT LONG TERM GOAL #4   Title Skin integrity to improve by palpable reduction in tissue density and resolution of pitting is medically necessary to limit infection risk and LE progression.   Baseline dependent   Time 12   Period Weeks   Status New   OT LONG TERM GOAL #5   Baseline dependent               Plan - 07/10/15 1621    Clinical Impression Statement Skin below knee slightly irritated, itchy and w/ mild betichia anteriorly at proximal leg.Pt denies pain/ discomfort and signs/symptoms of infection  are absent. We'll lighten up on wraps today and decrease from 3 to 2 larers in effort not to over compress decongesting leg.    Pt will benefit from skilled therapeutic intervention in order to improve on the following deficits (Retired) Decreased knowledge of precautions;Decreased knowledge of use of DME;Decreased mobility;Decreased skin integrity;Impaired perceived functional ability;Increased edema;Difficulty walking;Pain   Clinical Impairments Affecting Rehab Potential Cont OT as per POC. Emphasis shifts to RLE CDT today. Will continue to monitor LLE carefully and encourage ongoing diligent self care.   OT Treatment/Interventions Self-care/ADL training;Compression bandaging;DME and/or AE instruction;Patient/family education;Other (comment);Therapeutic exercise;Manual Therapy;Manual lymph drainage        Problem List There are no active problems to display for this patient.  Loel Dubonnet, MS, OTR/L, CLT-LANA 07/10/2015 4:24 PM   [name] 07/10/2015, 4:24 PM  [place] [facility] MAIN [facility] SERVICES [address] [place], [place], [address] Phone: [phone]   Fax:  [phone]

## 2015-07-10 NOTE — Therapy (Signed)
OT out sick. Pt appointment rescheduled. Encounter created in error. No charge this date.  Loel Dubonnet, MS, OTR/L, CLT-LANA 07/10/2015 4:51 PM

## 2015-07-10 NOTE — Patient Instructions (Signed)
As established 

## 2015-07-12 ENCOUNTER — Ambulatory Visit: Payer: Medicare Other | Admitting: Occupational Therapy

## 2015-07-12 DIAGNOSIS — I890 Lymphedema, not elsewhere classified: Secondary | ICD-10-CM | POA: Diagnosis not present

## 2015-07-12 NOTE — Therapy (Signed)
[place] [facility] MAIN [facility] SERVICES [address] [place], [place], [address] Phone: [phone]   Fax:  [phone]  Occupational Therapy Treatment  Patient Details  Name: Janet Galvan MRN: [id number] Date of Birth: Oct 24, 1946 Referring Provider:  Marguarite Arbour, MD  Encounter Date: 07/12/2015      OT End of Session - 07/12/15 1614    Visit Number 21   Number of Visits 36   Date for OT Re-Evaluation 08/13/15   OT Start Time 1305   OT Stop Time 1430   OT Time Calculation (min) 85 min   Equipment Utilized During Treatment complression wraps and supplies   Activity Tolerance Patient tolerated treatment well;No increased pain   Behavior During Therapy [facility] for tasks assessed/performed      No past medical history on file.  No past surgical history on file.  There were no vitals filed for this visit.  Visit Diagnosis:  Lymphedema      Subjective Assessment - 07/12/15 1610    Subjective  Pt presents for visit 20 CDT to BLE. Pt has no new complaints re RLE today. Emai;led DME vendor reqiuesting garment measurement for RLE. W patient's permission Faxed anatomical measurements to [facility] device manufacturer to explore charitable donation through [employer].   Patient Stated Goals  do the things I want to do.   Currently in Pain? No/denies                      OT Treatments/Exercises (OP) - 07/12/15 0001    Manual Therapy   Manual Therapy Edema management;Manual Lymphatic Drainage (MLD);Compression Bandaging   Manual Lymphatic Drainage (MLD) Manual lymph drainage (MLD) to RLE in supine utilizing functional inguinal lymph nodes and deep abdominal lymphatics as is customary for non-cancer related lower extremity LE, including bilateral "short neck" sequence, deep abdominal pathways, functional inguinal LN, lower extremity proximal to distal w/ emphasis on medial knee bottleneck and politeal LN. Performed fibrosis technique  to B maleoli and distal posterior leg to address fatty fibrosis. Good tolerance.   Compression Bandaging RLE wraps applied as established for LLE   Other Manual Therapy skin care w/ low pH lotion to BLE                OT Education - 07/12/15 1613    Education provided Yes   Education Details Cont skilled Pt edu for all LE self care protocols   Person(s) Educated Patient   Methods Explanation;Demonstration;Tactile cues;Verbal cues;Handout   Comprehension Verbalized understanding;Returned demonstration;Verbal cues required;Tactile cues required;Need further instruction             OT Long Term Goals - 05/15/15 1906    OT LONG TERM GOAL #1   Title Pt independent and 100% with all Intensive/Management phase LE self care protocols, including compression wrapping/ garment wear and care, lymphatic pumping ther ex, skin care, ther ex, by DC to limit infection risk , LE progression and further functional decline.   Baseline dependent   Time 12   Period Weeks   Status New   OT LONG TERM GOAL #2   Title Pt able to don/doff compression garments using assistive devices PRN on issue date, by DC to limit infection risk , LE progression and further functional decline.   Baseline dependent   Time 12   Period Weeks   OT LONG TERM GOAL #3   Title RLE limb volume reduction achieved by end of intensive as evidenced by 2  cm decrease at all calf landmarks.   Baseline dependent   Time 12   Period Weeks   Status New   OT LONG TERM GOAL #4   Title Skin integrity to improve by palpable reduction in tissue density and resolution of pitting is medically necessary to limit infection risk and LE progression.   Baseline dependent   Time 12   Period Weeks   Status New   OT LONG TERM GOAL #5   Baseline dependent               Plan - 07/12/15 1615    Clinical Impression Statement Pt continues to demonstrate progress towards all OT goals for LE self care and management. Although stubborn  tissue density and tightness persists, RLE apears to be reaching volumetric reduction plateau.Pt remains diligent will all self care protocols.  Pt agrees  plan to decrease OT frequency to 2 x weekly after next week. We're ready to complete garment measurements for RLE. LLE gar,ment is  containing LLE swelling quite well and Pt reports decreased discomfort in both legs.   Pt will benefit from skilled therapeutic intervention in order to improve on the following deficits (Retired) Decreased knowledge of precautions;Decreased knowledge of use of DME;Decreased mobility;Decreased skin integrity;Impaired perceived functional ability;Increased edema;Difficulty walking;Pain   Rehab Potential Good   Clinical Impairments Affecting Rehab Potential Cont OT as per POC. Emphasis shifts to RLE CDT today. Will continue to monitor LLE carefully and encourage ongoing diligent self care.   OT Frequency 2x / week   OT Duration 6 weeks   OT Treatment/Interventions Self-care/ADL training;Compression bandaging;DME and/or AE instruction;Patient/family education;Other (comment);Therapeutic exercise;Manual Therapy;Manual lymph drainage        Problem List There are no active problems to display for this patient.  Loel Dubonnet, MS, OTR/L, CLT-LANA 07/12/2015 4:19 PM   [name] 07/12/2015, 4:19 PM  [place] [facility] MAIN [facility] SERVICES [address] [place], [place], [address] Phone: [phone]   Fax:  [phone]

## 2015-07-12 NOTE — Therapy (Signed)
[facility] MAIN [facility] SERVICES [address] [place], [place], [address] Phone: [phone]   Fax:  [phone]  Occupational Therapy Treatment  Patient Details  Name: Janet Galvan MRN: [id number] Date of Birth: December 09, 1945 Referring Provider:  Marguarite Arbour, MD  Encounter Date: 07/10/2015      OT End of Session - 07/12/15 1614    Visit Number 21   Number of Visits 36   Date for OT Re-Evaluation 08/13/15   OT Start Time 1305   OT Stop Time 1430   OT Time Calculation (min) 85 min   Equipment Utilized During Treatment complression wraps and supplies   Activity Tolerance Patient tolerated treatment well;No increased pain   Behavior During Therapy [facility] for tasks assessed/performed      No past medical history on file.  No past surgical history on file.  There were no vitals filed for this visit.  Visit Diagnosis:  Lymphedema      Subjective Assessment - 07/12/15 1610    Subjective  Pt presents for visit 20 CDT to BLE. Pt has no new complaints re RLE today. Emai;led DME vendor reqiuesting garment measurement for RLE. W patient's permission Faxed anatomical measurements to [facility] device manufacturer to explore charitable donation through [employer].   Patient Stated Goals  do the things I want to do.   Currently in Pain? No/denies                      OT Treatments/Exercises (OP) - 07/12/15 0001    Manual Therapy   Manual Therapy Edema management;Manual Lymphatic Drainage (MLD);Compression Bandaging   Manual Lymphatic Drainage (MLD) Manual lymph drainage (MLD) to RLE in supine utilizing functional inguinal lymph nodes and deep abdominal lymphatics as is customary for non-cancer related lower extremity LE, including bilateral "short neck" sequence, deep abdominal pathways, functional inguinal LN, lower extremity proximal to distal w/ emphasis on medial knee bottleneck and politeal LN. Performed fibrosis technique to  B maleoli and distal posterior leg to address fatty fibrosis. Good tolerance.   Compression Bandaging RLE wraps applied as established for LLE   Other Manual Therapy skin care w/ low pH lotion to BLE                OT Education - 07/12/15 1613    Education provided Yes   Education Details Cont skilled Pt edu for all LE self care protocols   Person(s) Educated Patient   Methods Explanation;Demonstration;Tactile cues;Verbal cues;Handout   Comprehension Verbalized understanding;Returned demonstration;Verbal cues required;Tactile cues required;Need further instruction             OT Long Term Goals - 05/15/15 1906    OT LONG TERM GOAL #1   Title Pt independent and 100% with all Intensive/Management phase LE self care protocols, including compression wrapping/ garment wear and care, lymphatic pumping ther ex, skin care, ther ex, by DC to limit infection risk , LE progression and further functional decline.   Baseline dependent   Time 12   Period Weeks   Status New   OT LONG TERM GOAL #2   Title Pt able to don/doff compression garments using assistive devices PRN on issue date, by DC to limit infection risk , LE progression and further functional decline.   Baseline dependent   Time 12   Period Weeks   OT LONG TERM GOAL #3   Title RLE limb volume reduction achieved by end of intensive as evidenced by 2  cm decrease at all calf landmarks.   Baseline dependent   Time 12   Period Weeks   Status New   OT LONG TERM GOAL #4   Title Skin integrity to improve by palpable reduction in tissue density and resolution of pitting is medically necessary to limit infection risk and LE progression.   Baseline dependent   Time 12   Period Weeks   Status New   OT LONG TERM GOAL #5   Baseline dependent               Plan - 07-31-2015 1615    Clinical Impression Statement Pt continues to demonstrate progress towards all OT goals for LE self care and management. Although stubborn  tissue density and tightness persists, RLE apears to be reaching volumetric reduction plateau.Pt remains diligent will all self care protocols.  Pt agrees  plan to decrease OT frequency to 2 x weekly after next week. We're ready to complete garment measurements for RLE. LLE gar,ment is  containing LLE swelling quite well and Pt reports decreased discomfort in both legs.   Pt will benefit from skilled therapeutic intervention in order to improve on the following deficits (Retired) Decreased knowledge of precautions;Decreased knowledge of use of DME;Decreased mobility;Decreased skin integrity;Impaired perceived functional ability;Increased edema;Difficulty walking;Pain   Rehab Potential Good   Clinical Impairments Affecting Rehab Potential Cont OT as per POC. Emphasis shifts to RLE CDT today. Will continue to monitor LLE carefully and encourage ongoing diligent self care.   OT Frequency 2x / week   OT Duration 6 weeks   OT Treatment/Interventions Self-care/ADL training;Compression bandaging;DME and/or AE instruction;Patient/family education;Other (comment);Therapeutic exercise;Manual Therapy;Manual lymph drainage          G-Codes - 2015/07/31 1622    Functional Assessment Tool Used clinical observation, volumetric and anatomical measurement, interview   Functional Limitation Self care   Self Care Current Status (Z6109) At least 40 percent but less than 60 percent impaired, limited or restricted   Self Care Goal Status (U0454) At least 20 percent but less than 40 percent impaired, limited or restricted      Problem List There are no active problems to display for this patient.   [name] 31-Jul-2015, 4:24 PM   [facility] MAIN [facility] SERVICES [address] [place], [place], [address] Phone: [phone]   Fax:  [phone]

## 2015-07-12 NOTE — Patient Instructions (Signed)
As established 

## 2015-07-12 NOTE — Therapy (Signed)
[facility] MAIN [facility] SERVICES [address] [place], [place], [address] Phone: [phone]   Fax:  [phone]  Occupational Therapy Treatment & Progress Note Patient Details  Name: Janet Galvan MRN: [id number] Date of Birth: 23-Jan-1946 Referring Provider:  Marguarite Arbour, MD  Encounter Date: 07/10/2015      OT End of Session - 07/12/15 1614    Visit Number 21   Number of Visits 36   Date for OT Re-Evaluation 08/13/15   OT Start Time 1305   OT Stop Time 1430   OT Time Calculation (min) 85 min   Equipment Utilized During Treatment complression wraps and supplies   Activity Tolerance Patient tolerated treatment well;No increased pain   Behavior During Therapy [facility] for tasks assessed/performed      No past medical history on file.  No past surgical history on file.  There were no vitals filed for this visit.  Visit Diagnosis:  Lymphedema      Subjective Assessment - 07/12/15 1610    Subjective  Pt presents for visit 20 CDT to BLE. Pt has no new complaints re RLE today. Emai;led DME vendor reqiuesting garment measurement for RLE. W patient's permission Faxed anatomical measurements to [facility] device manufacturer to explore charitable donation through [employer].   Patient Stated Goals  do the things I want to do.   Currently in Pain? No/denies                      OT Treatments/Exercises (OP) - 07/12/15 0001    Manual Therapy   Manual Therapy Edema management;Manual Lymphatic Drainage (MLD);Compression Bandaging   Manual Lymphatic Drainage (MLD) Manual lymph drainage (MLD) to RLE in supine utilizing functional inguinal lymph nodes and deep abdominal lymphatics as is customary for non-cancer related lower extremity LE, including bilateral "short neck" sequence, deep abdominal pathways, functional inguinal LN, lower extremity proximal to distal w/ emphasis on medial knee bottleneck and politeal LN. Performed  fibrosis technique to B maleoli and distal posterior leg to address fatty fibrosis. Good tolerance.   Compression Bandaging RLE wraps applied as established for LLE   Other Manual Therapy skin care w/ low pH lotion to BLE                OT Education - 07/12/15 1613    Education provided Yes   Education Details Cont skilled Pt edu for all LE self care protocols   Person(s) Educated Patient   Methods Explanation;Demonstration;Tactile cues;Verbal cues;Handout   Comprehension Verbalized understanding;Returned demonstration;Verbal cues required;Tactile cues required;Need further instruction             OT Long Term Goals - 05/15/15 1906    OT LONG TERM GOAL #1   Title Pt independent and 100% with all Intensive/Management phase LE self care protocols, including compression wrapping/ garment wear and care, lymphatic pumping ther ex, skin care, ther ex, by DC to limit infection risk , LE progression and further functional decline.   Baseline dependent   Time 12   Period Weeks   Status New   OT LONG TERM GOAL #2   Title Pt able to don/doff compression garments using assistive devices PRN on issue date, by DC to limit infection risk , LE progression and further functional decline.   Baseline dependent   Time 12   Period Weeks   OT LONG TERM GOAL #3   Title RLE limb volume reduction achieved by end of intensive as evidenced  by 2 cm decrease at all calf landmarks.   Baseline dependent   Time 12   Period Weeks   Status New   OT LONG TERM GOAL #4   Title Skin integrity to improve by palpable reduction in tissue density and resolution of pitting is medically necessary to limit infection risk and LE progression.   Baseline dependent   Time 12   Period Weeks   Status New   OT LONG TERM GOAL #5   Baseline dependent               Plan - 2015/07/17 1615    Clinical Impression Statement Pt continues to demonstrate progress towards all OT goals for LE self care and  management. Although stubborn tissue density and tightness persists, RLE apears to be reaching volumetric reduction plateau.Pt remains diligent will all self care protocols.  Pt agrees  plan to decrease OT frequency to 2 x weekly after next week. We're ready to complete garment measurements for RLE. LLE gar,ment is  containing LLE swelling quite well and Pt reports decreased discomfort in both legs.   Pt will benefit from skilled therapeutic intervention in order to improve on the following deficits (Retired) Decreased knowledge of precautions;Decreased knowledge of use of DME;Decreased mobility;Decreased skin integrity;Impaired perceived functional ability;Increased edema;Difficulty walking;Pain   Rehab Potential Good   Clinical Impairments Affecting Rehab Potential Cont OT as per POC. Emphasis shifts to RLE CDT today. Will continue to monitor LLE carefully and encourage ongoing diligent self care.   OT Frequency 2x / week   OT Duration 6 weeks   OT Treatment/Interventions Self-care/ADL training;Compression bandaging;DME and/or AE instruction;Patient/family education;Other (comment);Therapeutic exercise;Manual Therapy;Manual lymph drainage          G-Codes - 2015/07/17 1622    Functional Assessment Tool Used clinical observation, volumetric and anatomical measurement, interview   Functional Limitation Self care   Self Care Current Status (Z6109) At least 40 percent but less than 60 percent impaired, limited or restricted   Self Care Goal Status (U0454) At least 20 percent but less than 40 percent impaired, limited or restricted      Problem List There are no active problems to display for this patient. Loel Dubonnet, MS, OTR/L, CLT-LANA 07/17/2015 4:38 PM   [name] 07-17-15, 4:38 PM  [facility] MAIN [facility] SERVICES [address] [place], [place], [address] Phone: [phone]   Fax:  [phone]

## 2015-07-14 ENCOUNTER — Ambulatory Visit: Payer: Medicare Other | Admitting: Occupational Therapy

## 2015-07-14 DIAGNOSIS — I890 Lymphedema, not elsewhere classified: Secondary | ICD-10-CM

## 2015-07-14 NOTE — Therapy (Signed)
[facility] MAIN [facility] SERVICES [address] [place], [place], [address] Phone: [phone]   Fax:  [phone]  Occupational Therapy Treatment  Patient Details  Name: Janet Galvan MRN: [id number] Date of Birth: 03/24/1946 Referring Provider:  Marguarite Arbour, MD  Encounter Date: 07/14/2015      OT End of Session - 07/14/15 1439    Visit Number 22   Number of Visits 36   Date for OT Re-Evaluation 08/13/15   OT Start Time 1309   OT Stop Time 1432   OT Time Calculation (min) 83 min   Equipment Utilized During Treatment complression wraps and supplies   Activity Tolerance Patient tolerated treatment well;No increased pain   Behavior During Therapy [facility] for tasks assessed/performed      No past medical history on file.  No past surgical history on file.  There were no vitals filed for this visit.  Visit Diagnosis:  Lymphedema      Subjective Assessment - 07/14/15 1326    Subjective  Pt presents for visit 21 CDT to BLE. Pt has no new complaints re RLE today.    Patient Stated Goals  do the things I want to do.   Currently in Pain? No/denies                      OT Treatments/Exercises (OP) - 07/14/15 0001    ADLs   ADL Education Given Yes   General Comments see Pt EDU   Manual Therapy   Manual Therapy Edema management;Manual Lymphatic Drainage (MLD);Compression Bandaging   Edema Management contacted Vendor to schedule RLE measurements for daytime   Manual Lymphatic Drainage (MLD) Manual lymph drainage (MLD) to RLE in supine utilizing functional inguinal lymph nodes and deep abdominal lymphatics as is customary for non-cancer related lower extremity LE, including bilateral "short neck" sequence, deep abdominal pathways, functional inguinal LN, lower extremity proximal to distal w/ emphasis on medial knee bottleneck and politeal LN. Performed fibrosis technique to B maleoli and distal posterior leg to address fatty  fibrosis. Good tolerance.   Compression Bandaging RLE wraps applied as established for LLE   Other Manual Therapy skin care w/ low pH lotion to BLE                OT Education - 07/14/15 1439    Education Details Cont Pt edu for all  LE self care protocols   Person(s) Educated Patient   Methods Explanation;Demonstration;Tactile cues;Verbal cues;Handout   Comprehension Verbalized understanding;Returned demonstration;Verbal cues required;Need further instruction             OT Long Term Goals - 05/15/15 1906    OT LONG TERM GOAL #1   Title Pt independent and 100% with all Intensive/Management phase LE self care protocols, including compression wrapping/ garment wear and care, lymphatic pumping ther ex, skin care, ther ex, by DC to limit infection risk , LE progression and further functional decline.   Baseline dependent   Time 12   Period Weeks   Status New   OT LONG TERM GOAL #2   Title Pt able to don/doff compression garments using assistive devices PRN on issue date, by DC to limit infection risk , LE progression and further functional decline.   Baseline dependent   Time 12   Period Weeks   OT LONG TERM GOAL #3   Title RLE limb volume reduction achieved by end of intensive as evidenced by 2 cm decrease at all  calf landmarks.   Baseline dependent   Time 12   Period Weeks   Status New   OT LONG TERM GOAL #4   Title Skin integrity to improve by palpable reduction in tissue density and resolution of pitting is medically necessary to limit infection risk and LE progression.   Baseline dependent   Time 12   Period Weeks   Status New   OT LONG TERM GOAL #5   Baseline dependent               Plan - 07/14/15 1440    Clinical Impression Statement RLE appears to be reaching clinical plateau in terms of volumetric reduction and tissue softening. Vendor contacted to schedule garment measurements. We are waiting to hear from charitable org re HOS devices. Cont as  per POc   Pt will benefit from skilled therapeutic intervention in order to improve on the following deficits (Retired) Decreased knowledge of precautions;Decreased knowledge of use of DME;Decreased mobility;Decreased skin integrity;Impaired perceived functional ability;Increased edema;Difficulty walking;Pain   Rehab Potential Good   Clinical Impairments Affecting Rehab Potential Cont OT as per POC. Emphasis shifts to RLE CDT today. Will continue to monitor LLE carefully and encourage ongoing diligent self care.   OT Frequency 2x / week   OT Duration 6 weeks   OT Treatment/Interventions Self-care/ADL training;Compression bandaging;DME and/or AE instruction;Patient/family education;Other (comment);Therapeutic exercise;Manual Therapy;Manual lymph drainage        Problem List There are no active problems to display for this patient.  Loel Dubonnet, MS, OTR/L, CLT-LANA 07/14/2015 2:43 PM  [name] 07/14/2015, 2:43 PM  [facility] MAIN [facility] SERVICES [address] [place], [place], [address] Phone: [phone]   Fax:  [phone]

## 2015-07-17 ENCOUNTER — Ambulatory Visit: Payer: Medicare Other | Admitting: Occupational Therapy

## 2015-07-19 ENCOUNTER — Ambulatory Visit: Payer: Medicare Other | Admitting: Occupational Therapy

## 2015-07-21 ENCOUNTER — Ambulatory Visit: Payer: Medicare Other | Admitting: Occupational Therapy

## 2015-07-21 ENCOUNTER — Ambulatory Visit: Payer: Medicare Other

## 2015-07-21 DIAGNOSIS — I890 Lymphedema, not elsewhere classified: Secondary | ICD-10-CM

## 2015-07-21 NOTE — Therapy (Signed)
[facility] MAIN [facility] SERVICES [address] [place], [place], [address] Phone: [phone]   Fax:  [phone]  Occupational Therapy Treatment  Patient Details  Name: Janet Galvan MRN: [id number] Date of Birth: [birth year] Referring Provider:  Marguarite Arbour, MD  Encounter Date: 07/21/2015      OT End of Session - 07/21/15 1549    Visit Number 22   Number of Visits 36   Date for OT Re-Evaluation 08/13/15   OT Start Time 0119   OT Stop Time 0245   OT Time Calculation (min) 86 min   Equipment Utilized During Treatment complression wraps and supplies   Activity Tolerance Patient tolerated treatment well;No increased pain   Behavior During Therapy [facility] for tasks assessed/performed      No past medical history on file.  No past surgical history on file.  There were no vitals filed for this visit.  Visit Diagnosis:  Lymphedema      Subjective Assessment - 07/21/15 1322    Subjective  Pt presents for visit 22 CDT to BLE. Pt has no new complaints re RLE today. [name] from [facility] is present to assist w/ measuring and fitting custom RLE garments. Pt had no difficulties during vacation interval.   Patient Stated Goals  do the things I want to do.   Currently in Pain? No/denies                      OT Treatments/Exercises (OP) - 07/21/15 0001    ADLs   ADL Education Given Yes   General Comments See PT EDU   Manual Therapy   Manual Therapy Edema management;Manual Lymphatic Drainage (MLD);Compression Bandaging   Edema Management garment measurements completed. Remake still not ordered. Vendor cpompleted additional measurements to add length. Expect fitting in 1-2 weeks.   Manual Lymphatic Drainage (MLD) Manual lymph drainage (MLD) to RLE in supine utilizing functional inguinal lymph nodes and deep abdominal lymphatics as is customary for non-cancer related lower extremity LE, including bilateral "short  neck" sequence, deep abdominal pathways, functional inguinal LN, lower extremity proximal to distal w/ emphasis on medial knee bottleneck and politeal LN. Performed fibrosis technique to B maleoli and distal posterior leg to address fatty fibrosis. Good tolerance.   Compression Bandaging RLE wraps applied as established for LLE   Other Manual Therapy skin care w/ low pH lotion to BLE                OT Education - 07/21/15 1546    Education Details Provided ongoing Pt edu and ADL training each visit for all components of LE self care, including compression wrapping and garment considerations, lymphatic pumping ther ex, simple self-MLD, and shin care.   Person(s) Educated Patient   Methods Explanation;Demonstration;Tactile cues;Verbal cues;Handout   Comprehension Verbalized understanding;Returned demonstration;Verbal cues required;Tactile cues required;Need further instruction             OT Long Term Goals - 05/15/15 1906    OT LONG TERM GOAL #1   Title Pt independent and 100% with all Intensive/Management phase LE self care protocols, including compression wrapping/ garment wear and care, lymphatic pumping ther ex, skin care, ther ex, by DC to limit infection risk , LE progression and further functional decline.   Baseline dependent   Time 12   Period Weeks   Status New   OT LONG TERM GOAL #2   Title Pt able to don/doff compression garments using assistive devices  PRN on issue date, by DC to limit infection risk , LE progression and further functional decline.   Baseline dependent   Time 12   Period Weeks   OT LONG TERM GOAL #3   Title RLE limb volume reduction achieved by end of intensive as evidenced by 2 cm decrease at all calf landmarks.   Baseline dependent   Time 12   Period Weeks   Status New   OT LONG TERM GOAL #4   Title Skin integrity to improve by palpable reduction in tissue density and resolution of pitting is medically necessary to limit infection risk and  LE progression.   Baseline dependent   Time 12   Period Weeks   Status New   OT LONG TERM GOAL #5   Baseline dependent               Plan - 07/21/15 1547    Clinical Impression Statement Pt continues to make progress towards goals. Pt managed LE self care protocols very well over her vacation break. Despite extended standing and walking her swelling is fairly well managed today. 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   Pt will benefit from skilled therapeutic intervention in order to improve on the following deficits (Retired) Decreased knowledge of precautions;Decreased knowledge of use of DME;Decreased mobility;Decreased skin integrity;Impaired perceived functional ability;Increased edema;Difficulty walking;Pain   Rehab Potential Good   Clinical Impairments Affecting Rehab Potential Cont OT as per POC. Emphasis shifts to RLE CDT today. Will continue to monitor LLE carefully and encourage ongoing diligent self care.   OT Frequency 2x / week   OT Duration 6 weeks   OT Treatment/Interventions Self-care/ADL training;Compression bandaging;DME and/or AE instruction;Patient/family education;Other (comment);Therapeutic exercise;Manual Therapy;Manual lymph drainage        Problem List There are no active problems to display for this patient.  Loel Dubonnet, MS, OTR/L, CLT-LANA 07/21/2015 3:51 PM    [name] 07/21/2015, 3:51 PM  [facility] MAIN [facility] SERVICES [address] [place], [place], [address] Phone: [phone]   Fax:  [phone]

## 2015-07-21 NOTE — Patient Instructions (Signed)
As established. See eval 

## 2015-07-24 ENCOUNTER — Ambulatory Visit: Payer: Medicare Other | Admitting: Occupational Therapy

## 2015-07-24 ENCOUNTER — Ambulatory Visit
Admission: RE | Admit: 2015-07-24 | Discharge: 2015-07-24 | Disposition: A | Discharge status: Home / Self Care | Payer: Medicare Other | Source: Ambulatory Visit | Attending: Internal Medicine | Admitting: Internal Medicine

## 2015-07-24 ENCOUNTER — Other Ambulatory Visit: Payer: Self-pay | Admitting: Internal Medicine

## 2015-07-24 DIAGNOSIS — Z1231 Encounter for screening mammogram for malignant neoplasm of breast: Secondary | ICD-10-CM | POA: Diagnosis not present

## 2015-07-26 ENCOUNTER — Ambulatory Visit: Payer: Medicare Other | Admitting: Occupational Therapy

## 2015-07-26 DIAGNOSIS — I890 Lymphedema, not elsewhere classified: Secondary | ICD-10-CM

## 2015-07-26 NOTE — Therapy (Signed)
[facility] MAIN [facility] SERVICES [address] [place], [place], [address] Phone: [phone]   Fax:  [phone]  Occupational Therapy Treatment  Patient Details  Name: Janet Galvan MRN: [id number] Date of Birth: 03-Feb-1946 Referring Provider:  Marguarite Arbour, MD  Encounter Date: 07/26/2015      OT End of Session - 07/26/15 1135    Visit Number 23   Number of Visits 36   Date for OT Re-Evaluation 08/13/15   OT Start Time 0845   OT Stop Time 0940   OT Time Calculation (min) 55 min   Activity Tolerance Patient tolerated treatment well;No increased pain   Behavior During Therapy [facility] for tasks assessed/performed      No past medical history on file.  No past surgical history on file.  There were no vitals filed for this visit.  Visit Diagnosis:  Lymphedema      Subjective Assessment - 07/26/15 1123    Subjective  Pt presents for visit 23 CDT to BLE. Pt states she continues to manage LE self care without difficulty between visits. Pt c/o itchy rash on lanterior ateral aspect of R leg. "It just came up." Pt agreeable to caling triage nurse at PCP to request meds.   Patient is accompained by: Family member   Patient Stated Goals  do the things I want to do.   Currently in Pain? No/denies   Pain Onset More than a month ago             LYMPHEDEMA/ONCOLOGY QUESTIONNAIRE - 07/26/15 1128    What other symptoms do you have   Other Symptoms ~ 8 cm x 8 cm pricley rash at R aterior lateral leg. No lymphorrhea. No signs or symptoms of infection.                 OT Treatments/Exercises (OP) - 07/26/15 0001    ADLs   ADL Education Given Yes   General Comments see Pt EDU   Manual Therapy   Manual Therapy Edema management;Manual Lymphatic Drainage (MLD);Compression Bandaging   Manual therapy comments cont MLD to RLE    Manual Lymphatic Drainage (MLD) Manual lymph drainage (MLD) to RLE in supine utilizing functional  inguinal lymph nodes and deep abdominal lymphatics as is customary for non-cancer related lower extremity LE, including bilateral "short neck" sequence, deep abdominal pathways, functional inguinal LN, lower extremity proximal to distal w/ emphasis on medial knee bottleneck and politeal LN. Performed fibrosis technique to B maleoli and distal posterior leg to address fatty fibrosis. Good tolerance.   Compression Bandaging No compression wraps toay in effort to limit bandage fatigue and to allow for Pt to apply topical meds for itching.. Pt agrees to use existing lite compression garment on RLE until next visit and to elevate wen seated   Other Manual Therapy no skin care w/ low pH lotion today 2/2 skin irritation as above                OT Education - 07/26/15 1132    Education provided Yes   Education Details Provided ongoing Pt edu and ADL training throughout visit for all components of LE self care, including compression wrapping and garment considerations, lymphatic pumping ther ex, simple self-MLD, and shin care. Reviewed cellulitis precautions and Rx.    Person(s) Educated Patient   Methods Explanation;Demonstration;Tactile cues;Verbal cues;Handout   Comprehension Verbalized understanding;Returned demonstration;Verbal cues required;Tactile cues required;Need further instruction  OT Long Term Goals - 05/15/15 1906    OT LONG TERM GOAL #1   Title Pt independent and 100% with all Intensive/Management phase LE self care protocols, including compression wrapping/ garment wear and care, lymphatic pumping ther ex, skin care, ther ex, by DC to limit infection risk , LE progression and further functional decline.   Baseline dependent   Time 12   Period Weeks   Status New   OT LONG TERM GOAL #2   Title Pt able to don/doff compression garments using assistive devices PRN on issue date, by DC to limit infection risk , LE progression and further functional decline.   Baseline  dependent   Time 12   Period Weeks   OT LONG TERM GOAL #3   Title RLE limb volume reduction achieved by end of intensive as evidenced by 2 cm decrease at all calf landmarks.   Baseline dependent   Time 12   Period Weeks   Status New   OT LONG TERM GOAL #4   Title Skin integrity to improve by palpable reduction in tissue density and resolution of pitting is medically necessary to limit infection risk and LE progression.   Baseline dependent   Time 12   Period Weeks   Status New   OT LONG TERM GOAL #5   Baseline dependent               Plan - 07/26/15 1136    Clinical Impression Statement Mild , dry, prickley rash at anterior lateral R leg most likely 2/2 sweating in wraps and increased itching 2/2 possiblle histamine reaction. Signs/ symptoms of cellulitis/ infection are absent. Kinesio tape had notabley positive effect on lymphatic decongestion as evidenvced by + indentations of tape fingers in skin and increased skin slack and wroinkles.  Pt reports some itching is typical, but this is increased over the past couple of weekds w/ wraps. Pt agrees to contact PCP' triage nurse to report symptoms and request meds.    Pt will benefit from skilled therapeutic intervention in order to improve on the following deficits (Retired) Decreased knowledge of precautions;Decreased knowledge of use of DME;Decreased mobility;Decreased skin integrity;Impaired perceived functional ability;Increased edema;Difficulty walking;Pain   Rehab Potential Good   Clinical Impairments Affecting Rehab Potential Cont OT as per POC. Emphasis shifts to RLE CDT today. Will continue to monitor LLE carefully and encourage ongoing diligent self care.   OT Frequency 2x / week   OT Duration 6 weeks   OT Treatment/Interventions Self-care/ADL training;Compression bandaging;DME and/or AE instruction;Patient/family education;Other (comment);Therapeutic exercise;Manual Therapy;Manual lymph drainage        Problem  List There are no active problems to display for this patient.  Loel Dubonnet, MS, OTR/L, CLT-LANA 07/26/2015 11:46 AM  [name] 07/26/2015, 11:46 AM  [place] [facility] MAIN [facility] SERVICES [address] [place], [place], [address] Phone: [phone]   Fax:  [phone]

## 2015-07-26 NOTE — Patient Instructions (Addendum)
Pt instructed to call traige nurse for PCP to describe rash and request meds. Reviewed celulitis precautions. Pt instructed to call MD or go to ED if signs/ symptopms of infection present , or worsen.  Pt instructed to use existing RLE lite compression garment during visit interval to limit bandage fatigue to skin and allow for frequent application of anti itch meds.

## 2015-07-28 ENCOUNTER — Ambulatory Visit: Payer: Medicare Other | Admitting: Occupational Therapy

## 2015-07-28 DIAGNOSIS — I890 Lymphedema, not elsewhere classified: Secondary | ICD-10-CM | POA: Diagnosis not present

## 2015-07-28 NOTE — Therapy (Signed)
[place] [facility] MAIN [facility] SERVICES [address] [place], [place], [address] Phone: [phone]   Fax:  [phone]  Occupational Therapy Treatment  Patient Details  Name: Janet Galvan MRN: [id number] Date of Birth: [birth year] Referring Provider:  Marguarite Arbour, MD  Encounter Date: 07/28/2015      OT End of Session - 07/28/15 1516    Visit Number 24   Number of Visits 36   Date for OT Re-Evaluation 08/13/15   OT Start Time 1300   OT Stop Time 1415   OT Time Calculation (min) 75 min   Activity Tolerance Patient tolerated treatment well;No increased pain   Behavior During Therapy [facility] for tasks assessed/performed      No past medical history on file.  No past surgical history on file.  There were no vitals filed for this visit.  Visit Diagnosis:  Lymphedema      Subjective Assessment - 07/28/15 1505    Subjective  Pt presents for visit 24 CDT to BLE. Pt states she has been applying gold bond lotion to rash since last visit and is pleased w/ results. She reports she did not call her PCP.    Patient Stated Goals  do the things I want to do.   Currently in Pain? Yes   Pain Location Back   Pain Type Chronic pain                      OT Treatments/Exercises (OP) - 07/28/15 0001    ADLs   ADL Education Given Yes   General Comments see Pt edu   Manual Therapy   Manual Therapy Edema management;Manual Lymphatic Drainage (MLD)   Manual therapy comments cont MLD to RLE    Manual Lymphatic Drainage (MLD) Manual lymph drainage (MLD) to RLE in supine utilizing functional inguinal lymph nodes and deep abdominal lymphatics as is customary for non-cancer related lower extremity LE, including bilateral "short neck" sequence, deep abdominal pathways, functional inguinal LN, lower extremity proximal to distal w/ emphasis on medial knee bottleneck and politeal LN. Performed fibrosis technique to B maleoli and distal posterior leg  to address fatty fibrosis. Good tolerance.   Compression Bandaging No compression wraps toay in effort to limit bandage fatigue and to allow for Pt to apply topical meds for itching.. Pt agrees to use existing lite compression garment on RLE until next visit and to elevate wen seated   Other Manual Therapy RLE leg rash is mildly improved today. No signs/ symptoms of infection. skin bathed with mild soap and water after MLD w/ skin care w/ low pH lotion today 2/2 skin irritation as above                OT Education - 07/28/15 1515    Education provided Yes   Education Details Provided skilled Pt/ caregiver edu and ADL training throughout visit for all components of LE self care, including compression wrapping, garment considerations, lymphatic pumping ther ex, simple self-MLD, and shin care.    Person(s) Educated Patient   Methods Explanation;Demonstration;Tactile cues;Verbal cues;Handout   Comprehension Verbalized understanding;Returned demonstration;Verbal cues required;Need further instruction;Tactile cues required             OT Long Term Goals - 05/15/15 1906    OT LONG TERM GOAL #1   Title Pt independent and 100% with all Intensive/Management phase LE self care protocols, including compression wrapping/ garment wear and care, lymphatic pumping ther ex, skin care, ther ex,  by DC to limit infection risk , LE progression and further functional decline.   Baseline dependent   Time 12   Period Weeks   Status New   OT LONG TERM GOAL #2   Title Pt able to don/doff compression garments using assistive devices PRN on issue date, by DC to limit infection risk , LE progression and further functional decline.   Baseline dependent   Time 12   Period Weeks   OT LONG TERM GOAL #3   Title RLE limb volume reduction achieved by end of intensive as evidenced by 2 cm decrease at all calf landmarks.   Baseline dependent   Time 12   Period Weeks   Status New   OT LONG TERM GOAL #4    Title Skin integrity to improve by palpable reduction in tissue density and resolution of pitting is medically necessary to limit infection risk and LE progression.   Baseline dependent   Time 12   Period Weeks   Status New   OT LONG TERM GOAL #5   Baseline dependent               Plan - 07/28/15 1516    Clinical Impression Statement Pt performing didligent skin care to LLE rash as directed during visit interval, and despite going without wraps, rash is only mildly improved today. Pt agrees to carefully monitor skin condition and contact physician ASAP if condition worsens in effort to limit possible cellulitis.  Pt continues to demonstrate progress towards goals. Cont as per POC   Pt will benefit from skilled therapeutic intervention in order to improve on the following deficits (Retired) Decreased knowledge of precautions;Decreased knowledge of use of DME;Decreased mobility;Decreased skin integrity;Impaired perceived functional ability;Increased edema;Difficulty walking;Pain   Rehab Potential Good   Clinical Impairments Affecting Rehab Potential Cont OT as per POC. Emphasis shifts to RLE CDT today. Will continue to monitor LLE carefully and encourage ongoing diligent self care.   OT Frequency 2x / week   OT Duration 6 weeks   OT Treatment/Interventions Self-care/ADL training;Compression bandaging;DME and/or AE instruction;Patient/family education;Other (comment);Therapeutic exercise;Manual Therapy;Manual lymph drainage        Problem List There are no active problems to display for this patient.   Loel Dubonnet, MS, OTR/L, CLT-LANA 07/28/2015 3:19 PM    Janet Galvan 07/28/2015, 3:19 PM  [facility] MAIN [facility] SERVICES [address] [place], [place], [address] Phone: [phone]   Fax:  [phone]

## 2015-07-28 NOTE — Patient Instructions (Signed)
As established.  Report skin rash to referring MD ASAP or go to ED if condition worsens, or if signs / symptoms of infection are noted.

## 2015-07-31 ENCOUNTER — Ambulatory Visit: Payer: Medicare Other | Admitting: Occupational Therapy

## 2015-07-31 DIAGNOSIS — I890 Lymphedema, not elsewhere classified: Secondary | ICD-10-CM | POA: Diagnosis not present

## 2015-07-31 NOTE — Therapy (Signed)
[facility] MAIN [facility] SERVICES [address] [place], [place], [address] Phone: [phone]   Fax:  [phone]  Occupational Therapy Treatment  Patient Details  Name: Janet Galvan MRN: [id number] Date of Birth: 1945-12-13 Referring Provider:  Marguarite Arbour, MD  Encounter Date: 07/31/2015      OT End of Session - 07/31/15 1436    Visit Number (p) 25   Number of Visits (p) 36   Date for OT Re-Evaluation (p) 08/13/15   OT Start Time (p) 1300   OT Stop Time (p) 1415   OT Time Calculation (min) (p) 75 min   Activity Tolerance (p) Patient tolerated treatment well;No increased pain   Behavior During Therapy (p) [facility] for tasks assessed/performed      No past medical history on file.  No past surgical history on file.  There were no vitals filed for this visit.  Visit Diagnosis:  Lymphedema      Subjective Assessment - 07/31/15 1426    Subjective  Pt presents for visit 25 for CDT to BLE. Pt continues to present with RLE rash at anterior lateral leg, which appears to have spread to the mid shin , but without worsening severity. Pt agrees w recommendation to report symptoms of possible cellulitis and request a consult w/ Dr. Judithann Sheen. ASAP.   Patient Stated Goals  do the things I want to do.   Currently in Pain? No/denies                      OT Treatments/Exercises (OP) - 07/31/15 0001    ADLs   ADL Education Given Yes   General Comments see Pt EDU   Manual Therapy   Manual Therapy Edema management;Manual Lymphatic Drainage (MLD)   Manual therapy comments No MLD or  compression to RLE 2/2 suspected cellulitis. MLD to LLE as established.   Edema Management Pt to call MD  immediately after this apt to reports suspected cellulitis   Manual Lymphatic Drainage (MLD) Manual lymph drainage (MLD) to LLE in supine utilizing functional inguinal lymph nodes and deep abdominal lymphatics as is customary for non-cancer related  lower extremity LE, including bilateral "short neck" sequence, deep abdominal pathways, functional inguinal LN, lower extremity proximal to distal w/ emphasis on medial knee bottleneck and politeal LN. Performed fibrosis technique to B maleoli and distal posterior leg to address fatty fibrosis. Good tolerance.   Compression Bandaging No compression wraps toay in effort to limit bandage fatigue and to allow for Pt to apply topical meds for itching.. Pt agrees to use existing lite compression garment on RLE until next visit and to elevate wen seated   Other Manual Therapy RLE leg rash shoes no improvent today and appears to have spread to anterior distal leg. Leg is slightly more swollen. Skin temp is normal and Pt denies other associated symptoms.                OT Education - 07/31/15 1435    Education provided Yes   Education Details cellulitis edu and precautions   Person(s) Educated Patient   Methods Explanation;Demonstration   Comprehension Verbalized understanding;Returned demonstration             OT Long Term Goals - 05/15/15 1906    OT LONG TERM GOAL #1   Title Pt independent and 100% with all Intensive/Management phase LE self care protocols, including compression wrapping/ garment wear and care, lymphatic pumping ther ex, skin care, ther  ex, by DC to limit infection risk , LE progression and further functional decline.   Baseline dependent   Time 12   Period Weeks   Status New   OT LONG TERM GOAL #2   Title Pt able to don/doff compression garments using assistive devices PRN on issue date, by DC to limit infection risk , LE progression and further functional decline.   Baseline dependent   Time 12   Period Weeks   OT LONG TERM GOAL #3   Title RLE limb volume reduction achieved by end of intensive as evidenced by 2 cm decrease at all calf landmarks.   Baseline dependent   Time 12   Period Weeks   Status New   OT LONG TERM GOAL #4   Title Skin integrity to  improve by palpable reduction in tissue density and resolution of pitting is medically necessary to limit infection risk and LE progression.   Baseline dependent   Time 12   Period Weeks   Status New   OT LONG TERM GOAL #5   Baseline dependent               Plan - 07/31/15 1609    Clinical Impression Statement Pt presenting w/ slight increase in RLE swelling below the knee and onoing itchy rash w / onset mid week last week. Rash does not appear to be worsening in intensity but it has spread from anterolateral leg to anterial distal . Skin temperature is WNL. Lymphorhhea is absent. Pt wll benefit from consult for suspected cellulitis with Dr. Judithann Sheen to limit infection. Pt understands no self MLD or compression until 48-72 hrs after starting oral antibiotic, if that is prescribed. We will hold off on MLD, skin care and compression PRN. We'l go ahead w/ BLE garment fitting on Weds if possible to speed bandage DC.    Pt will benefit from skilled therapeutic intervention in order to improve on the following deficits (Retired) Decreased knowledge of precautions;Decreased knowledge of use of DME;Decreased mobility;Decreased skin integrity;Impaired perceived functional ability;Increased edema;Difficulty walking;Pain   Rehab Potential Good   Clinical Impairments Affecting Rehab Potential Cont OT as per POC. Emphasis shifts to RLE CDT today. Will continue to monitor LLE carefully and encourage ongoing diligent self care.   OT Frequency 2x / week   OT Duration 6 weeks   OT Treatment/Interventions Self-care/ADL training;Compression bandaging;DME and/or AE instruction;Patient/family education;Other (comment);Therapeutic exercise;Manual Therapy;Manual lymph drainage        Problem List There are no active problems to display for this patient.  Loel Dubonnet, MS, OTR/L, CLT-LANA 07/31/2015 4:19 PM   [name] 07/31/2015, 4:19 PM  [place] [facility] MAIN  [facility] SERVICES [address] [place], [place], [address] Phone: [phone]   Fax:  [phone]

## 2015-07-31 NOTE — Patient Instructions (Signed)
Pt to call referring MD to report leg rash and request consult ASAP for possible cellulitis. Pt verbalized understanding of NO simple self MLD and NO compression to RLE for 480-72 hours after commencing any prescribed oral antibiotic.  Lymphedema Precautions  If you experience atypical shortness of breath, or notice any signs /symptoms of skin infection (aka cellulitis) remove all compression wraps/ garments, discontinue manual lymphatic drainage (MLD), and report symptoms to your physician immediately. Discontinue MLD and compression for 72 hours after you take your first oral antibiotic so not to spread the infection.   Lymphedema Self- Care Instructions  1. EXERCISE: Perform lymphatic pumping there ex 2 x a day. While wearing your compression wraps or garments. Perform 10 reps of each exercise bilaterally and be sure to perform them in order. Don;t skip around!  OMIT PARTIAL SIT UP  2. MLD: Perform simple self-Manual Lymphatic Drainage (MLD) at least once a day as directed.  3. WRAPS: Compression wraps are to be worn 23 hrs/ 7 days/wk during Intensive Phase of Complete Decongestive Therapy (CDT).Building tolerance may take time and practice, so don't get discouraged. If bandages begin to feel tight during periods of inactivity and/or during the night, try performing your exercises to loosen them.   4. GARMENTS: During Management Phase CDT your compression garments are to be worn during waking hours when active. Do NOT sleep in your garments!!   5. PUT YOUR FEET UP! Elevate your feet and legs and feet to the level of your heart whenever you are sitting down.   6. SKIN: Carefully monitor skin condition and perform impeccable hygiene daily. Bathe skin with mild soap and water and apply low pH lotion (aka Eucerin ) to improve hydration and limit infection risk.    Lymphatic Pumping Exercises:

## 2015-08-02 ENCOUNTER — Ambulatory Visit: Payer: Medicare Other | Admitting: Occupational Therapy

## 2015-08-02 DIAGNOSIS — I890 Lymphedema, not elsewhere classified: Secondary | ICD-10-CM

## 2015-08-02 NOTE — Patient Instructions (Signed)
CCL 3 compression knee highs to be worn full time for waking hours only.  Lymphedema Precautions  If you experience atypical shortness of breath, or notice any signs /symptoms of skin infection (aka cellulitis) remove all compression wraps/ garments, discontinue manual lymphatic drainage (MLD), and report symptoms to your physician immediately. Discontinue MLD and compression for 72 hours after you take your first oral antibiotic so not to spread the infection.   Lymphedema Self- Care Instructions  1. EXERCISE: Perform lymphatic pumping there ex 2 x a day. While wearing your compression wraps or garments. Perform 10 reps of each exercise bilaterally and be sure to perform them in order. Don;t skip around!  OMIT PARTIAL SIT UP  2. MLD: Perform simple self-Manual Lymphatic Drainage (MLD) at least once a day as directed.  3. WRAPS: Compression wraps are to be worn 23 hrs/ 7 days/wk during Intensive Phase of Complete Decongestive Therapy (CDT).Building tolerance may take time and practice, so don't get discouraged. If bandages begin to feel tight during periods of inactivity and/or during the night, try performing your exercises to loosen them.   4. GARMENTS: During Management Phase CDT your compression garments are to be worn during waking hours when active. Do NOT sleep in your garments!!   5. PUT YOUR FEET UP! Elevate your feet and legs and feet to the level of your heart whenever you are sitting down.   6. SKIN: Carefully monitor skin condition and perform impeccable hygiene daily. Bathe skin with mild soap and water and apply low pH lotion (aka Eucerin ) to improve hydration and limit infection risk.    Lymphatic Pumping Exercises:

## 2015-08-02 NOTE — Therapy (Signed)
[facility] MAIN [facility] SERVICES [address] [place], [place], [address] Phone: [phone]   Fax:  [phone]  Occupational Therapy Treatment  Patient Details  Name: Janet Galvan MRN: [id number] Date of Birth: Nov 08, 1946 Referring Provider:  Marguarite Arbour, MD  Encounter Date: 08/02/2015      OT End of Session - 08/02/15 1642    Visit Number 26   Number of Visits 36   Date for OT Re-Evaluation 08/13/15   OT Start Time 1300   OT Stop Time 1410   OT Time Calculation (min) 70 min   Activity Tolerance Patient tolerated treatment well;No increased pain   Behavior During Therapy [facility] for tasks assessed/performed      No past medical history on file.  No past surgical history on file.  There were no vitals filed for this visit.  Visit Diagnosis:  Lymphedema      Subjective Assessment - 08/02/15 1643    Subjective  Pt presents for visit 26 for CDT to BLE. Pt continues to present with RLE rash at anterior lateral leg. She is seeing her PCP after this ppointment. [name] from [facility] (DME vendor) is here to assist w/ fitting for replacement LLE garment and new LLE class 3 knee highs.   Patient Stated Goals  do the things I want to do.   Currently in Pain? No/denies                      OT Treatments/Exercises (OP) - 08/02/15 0001    ADLs   ADL Education Given (p) Yes   General Comments (p) see Pt EDU   Manual Therapy   Manual Therapy (p) Edema management;Manual Lymphatic Drainage (MLD)   Manual therapy comments (p) No MLD or  compression to RLE 2/2 suspected cellulitis. MLD to LLE as established.   Edema Management (p) Pt to call MD  immediately after this apt to reports suspected cellulitis                OT Education - 08/02/15 1644    Education provided Yes   Education Details Emphasis of skilled Pt edu on compression garment wear and care, including donning and doffing using assistive  devices.   Person(s) Educated Patient   Methods Explanation;Demonstration;Tactile cues;Verbal cues   Comprehension Verbalized understanding;Need further instruction             OT Long Term Goals - 05/15/15 1906    OT LONG TERM GOAL #1   Title Pt independent and 100% with all Intensive/Management phase LE self care protocols, including compression wrapping/ garment wear and care, lymphatic pumping ther ex, skin care, ther ex, by DC to limit infection risk , LE progression and further functional decline.   Baseline dependent   Time 12   Period Weeks   Status New   OT LONG TERM GOAL #2   Title Pt able to don/doff compression garments using assistive devices PRN on issue date, by DC to limit infection risk , LE progression and further functional decline.   Baseline dependent   Time 12   Period Weeks   OT LONG TERM GOAL #3   Title RLE limb volume reduction achieved by end of intensive as evidenced by 2 cm decrease at all calf landmarks.   Baseline dependent   Time 12   Period Weeks   Status New   OT LONG TERM GOAL #4   Title Skin integrity to improve by  palpable reduction in tissue density and resolution of pitting is medically necessary to limit infection risk and LE progression.   Baseline dependent   Time 12   Period Weeks   Status New   OT LONG TERM GOAL #5   Baseline dependent               Plan - 08/02/15 1648    Clinical Impression Statement Pt presents with minimally improved RLE rash today. She sees her PCP this afternoon after this appointment. in effort to limit progression to cellulitis infection, to limit set back in progress, and prevent an interruption in care. New CCL3 compression garments issued today (LLE is remake and RLE is new)  appear to be slightly too long overall and  may be somewhat too constricting at toe edges Pt will assess over appointment interval and we'lll revisit next session. Pt agrees w/ plan to reduce Rx frequencvy after next visit.Marland Kitchen    Pt will benefit from skilled therapeutic intervention in order to improve on the following deficits (Retired) Decreased knowledge of precautions;Decreased knowledge of use of DME;Decreased mobility;Decreased skin integrity;Impaired perceived functional ability;Increased edema;Difficulty walking;Pain   Rehab Potential Good   Clinical Impairments Affecting Rehab Potential Cont OT as per POC. Emphasis shifts to RLE CDT today. Will continue to monitor LLE carefully and encourage ongoing diligent self care.   OT Frequency 2x / week   OT Duration 6 weeks   OT Treatment/Interventions Self-care/ADL training;Compression bandaging;DME and/or AE instruction;Patient/family education;Other (comment);Therapeutic exercise;Manual Therapy;Manual lymph drainage        Problem List There are no active problems to display for this patient.  Loel Dubonnet, MS, OTR/L, CLT-LANA 08/02/2015 5:00 PM  [name] 08/02/2015, 5:00 PM  [facility] MAIN [facility] SERVICES [address] [place], [place], [address] Phone: [phone]   Fax:  [phone]

## 2015-08-04 ENCOUNTER — Ambulatory Visit: Payer: Medicare Other | Attending: Internal Medicine | Admitting: Occupational Therapy

## 2015-08-04 DIAGNOSIS — I890 Lymphedema, not elsewhere classified: Secondary | ICD-10-CM | POA: Diagnosis present

## 2015-08-04 NOTE — Therapy (Signed)
[facility] MAIN [facility] SERVICES [address] [place], [place], [address] Phone: [phone]   Fax:  [phone]  Occupational Therapy Treatment  Patient Details  Name: Janet Galvan MRN: [id number] Date of Birth: 11-18-1946 Referring Provider:  Marguarite Arbour, MD  Encounter Date: 08/04/2015      OT End of Session - 08/04/15 1246    Visit Number 27   Number of Visits 36   Date for OT Re-Evaluation 08/13/15   OT Start Time 1000   OT Stop Time 1030   OT Time Calculation (min) 30 min   Activity Tolerance Patient tolerated treatment well;No increased pain   Behavior During Therapy [facility] for tasks assessed/performed      No past medical history on file.  No past surgical history on file.  There were no vitals filed for this visit.  Visit Diagnosis:  Lymphedema      Subjective Assessment - 08/04/15 1238    Subjective  Pt presents for visit 27 for CDT to BLE. Pt reports she saw Dr Judithann Sheen yesterday about the rash on her R  leg. She  reports he felt it was "borderline" for cellulitis, and he prescribed an oral antibiotic (Keflex?) Pt reports remade  compression knee high on L is comfortable and staying in place. She has not yet worn the RLE garment in keeping w/ my instructions to DC MLD and compression until she has had antibiotic for 48 hours. Pt in agreement w/ plan to call PRN after today and return for 1 month FU.   Patient is accompained by: Family member   Pertinent History suspected cellulitis RLE- Referring MD prescribed course of Keflex ? yesterday   Patient Stated Goals  do the things I want to do.   Currently in Pain? No/denies                      OT Treatments/Exercises (OP) - 08/04/15 0001    ADLs   ADL Education Given Yes   General Comments see Pt edu   Manual Therapy   Manual Therapy Edema management   Manual therapy comments No MLD or  compression to RLE 2/2 suspected cellulitis. MLD to LLE as  established.   Edema Management Pt started Keflex last night   Other Manual Therapy RLE leg rash slightly improved today.                OT Education - 08/04/15 1245    Education provided Yes   Education Details Reviewed LE self care protocols and wear and care instructions for custom garments. Reviewed cellulitis precautions   Person(s) Educated Patient   Methods Explanation;Demonstration;Handout;Tactile cues;Verbal cues   Comprehension Verbalized understanding;Returned demonstration             OT Long Term Goals - 08/04/15 1253    OT LONG TERM GOAL #1   Title Pt independent and 100% with all Intensive/Management phase LE self care protocols, including compression wrapping/ garment wear and care, lymphatic pumping ther ex, skin care, ther ex, by DC to limit infection risk , LE progression and further functional decline.   Baseline dependent   Time 12   Period Weeks   Status Achieved   OT LONG TERM GOAL #2   Title Pt able to don/doff compression garments using assistive devices PRN on issue date, by DC to limit infection risk , LE progression and further functional decline.   Baseline dependent   Time 12   Period  Weeks   Status Achieved   OT LONG TERM GOAL #3   Title RLE limb volume reduction achieved by end of intensive as evidenced by 2 cm decrease at all calf landmarks.   Baseline dependent   Time 12   Period Weeks   Status Achieved   OT LONG TERM GOAL #4   Title Skin integrity to improve by palpable reduction in tissue density and resolution of pitting is medically necessary to limit infection risk and LE progression.   Baseline dependent   Time 12   Period Weeks   Status Achieved   OT LONG TERM GOAL #5   Title BLE limb volume reductions achieved during Intensive Phase CDT to be retained for 3 months in effort to limit LE progression, infection risk, and further functional decline    Baseline SBA   Time 3   Period Months   Status On-going                Plan - 08/04/15 1247    Clinical Impression Statement Pt has successfully completed Intensive Phase CDT to BLE to address stage 2  secondary lymphedema. Pt has made excellent progress towards all goals. Leg pain is decreased in severity, frequency and duration from constant to occasional. Skin condition is much improved and swelling is decreased within goal range. Pt is dilgent w/ all LE self care protocols. Daytime compression  garments are comfortable and appear to contain swelling approproiately. Pt is able to don and doff using asidstive devices and understand proper wear and care regimes. Pt agrees w/ plan to return in 1 month for Management Phase CDT F/U. She will call PRN is garment issues arise of if she has clinical concerns.    Pt will benefit from skilled therapeutic intervention in order to improve on the following deficits (Retired) Decreased knowledge of precautions;Decreased knowledge of use of DME;Decreased mobility;Decreased skin integrity;Impaired perceived functional ability;Increased edema;Difficulty walking;Pain   Rehab Potential Good   Clinical Impairments Affecting Rehab Potential Cont OT as per POC. Emphasis shifts to RLE CDT today. Will continue to monitor LLE carefully and encourage ongoing diligent self care.   OT Frequency Monthly   OT Treatment/Interventions Compression bandaging;DME and/or AE instruction;Patient/family education;Other (comment);Therapeutic exercise;Manual Therapy;Manual lymph drainage;Self-care/ADL training   Plan Continue to facilitate procurement of HOS device via chartitable donation or reduced price   Consulted and Agree with Plan of Care Patient        Problem List There are no active problems to display for this patient.  Loel Dubonnet, MS, OTR/L, CLT-LANA 08/04/2015 12:56 PM   [name] 08/04/2015, 12:56 PM  [facility] MAIN [facility] SERVICES [address] [place],  [place], [address] Phone: [phone]   Fax:  [phone]

## 2015-08-04 NOTE — Patient Instructions (Signed)
Lymphedema Precautions  If you experience atypical shortness of breath, or notice any signs /symptoms of skin infection (aka cellulitis) remove all compression wraps/ garments, discontinue manual lymphatic drainage (MLD), and report symptoms to your physician immediately. Discontinue MLD and compression for 72 hours after you take your first oral antibiotic so not to spread the infection.   Lymphedema Self- Care Instructions  1. EXERCISE: Perform lymphatic pumping there ex 2 x a day. While wearing your compression wraps or garments. Perform 10 reps of each exercise bilaterally and be sure to perform them in order. Don;t skip around!  OMIT PARTIAL SIT UP  2. MLD: Perform simple self-Manual Lymphatic Drainage (MLD) at least once a day as directed.  3. WRAPS: Compression wraps are to be worn 23 hrs/ 7 days/wk during Intensive Phase of Complete Decongestive Therapy (CDT).Building tolerance may take time and practice, so don't get discouraged. If bandages begin to feel tight during periods of inactivity and/or during the night, try performing your exercises to loosen them.   4. GARMENTS: During Management Phase CDT your compression garments are to be worn during waking hours when active. Do NOT sleep in your garments!! Monitor condition and replace q 3-6 months and PRN. We'll keep working on obtaining discounted price, or donated HOD device.  5. PUT YOUR FEET UP! Elevate your feet and legs and feet to the level of your heart whenever you are sitting down.   6. SKIN: Carefully monitor skin condition and perform impeccable hygiene daily. Bathe skin with mild soap and water and apply low pH lotion (aka Eucerin ) to improve hydration and limit infection risk.    Lymphatic Pumping Exercises:

## 2015-09-07 ENCOUNTER — Ambulatory Visit: Payer: Medicare Other | Attending: Internal Medicine | Admitting: Occupational Therapy

## 2015-09-07 DIAGNOSIS — I890 Lymphedema, not elsewhere classified: Secondary | ICD-10-CM | POA: Insufficient documentation

## 2015-09-07 NOTE — Therapy (Signed)
[place] MAIN [facility] SERVICES [address] [place], [place], [address] Phone: [phone]   Fax:  [phone]  Occupational Therapy Treatment and Discharge Summary  Patient Details  Name: Janet Galvan MRN: [id number] Date of Birth: Apr 12, 1946 Referring Provider:  Idelle Crouch, MD  Encounter Date: 09/07/2015      OT End of Session - 09/07/15 1115    Visit Number 28   Number of Visits 70   OT Start Time 0935   OT Stop Time 1035   OT Time Calculation (min) 60 min   Activity Tolerance Patient tolerated treatment well;No increased pain   Behavior During Therapy [facility] for tasks assessed/performed      No past medical history on file.  No past surgical history on file.  There were no vitals filed for this visit.  Visit Diagnosis:  Lymphedema      Subjective Assessment - 09/07/15 1101    Subjective  Pt presents for visit 28 for follow up to CDT to address BLE Lymphedema. . Pt was last seen for OT LE care on 08/22/15 when Management Phase CDT began. Pt reports she has been managing LE seld care well as directed. She reports decreased leg pain, but is a bit concerned that leg swelling may be related to possible autoimmune issue . Dx is pending .   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   Currently in Pain? No/denies   Pain Onset --   Pain Frequency Occasional             LYMPHEDEMA/ONCOLOGY QUESTIONNAIRE - 09/07/15 1108    Right Lower Extremity Lymphedema   Other RLE AG volume 11,669.49 ml . AD limb volume= 4286.27.    Other limb volume differential (LVD) below knee (A-D) = 2.58%, R>L; LVD toes to groin (A-G)-1.80%R>L   Left Lower Extremity Lymphedema   Other LLE AG volume 11,245.47 ml . AD limb volume= 3998.33                 OT  Treatments/Exercises (OP) - 09/07/15 0001    ADLs   ADL Education Given Yes   Manual Therapy   Manual Therapy Edema management   Edema Management BLE comparative limb volumetrics completed today.   Compression Bandaging Existing custom AD compression knee highs in good condition. Pt able to don and doff with no difficulty   Other Manual Therapy BLE comparative limb volumetrics completed today.                OT Education - 09/07/15 1114    Education provided Yes   Education Details Reviewed all LE self care protocols and importance of daily , diligent self care to limit progression and infection risk   Person(s) Educated Patient   Methods Explanation;Demonstration   Comprehension Verbalized understanding;Other (comment)  Pt independent w/ LE self care- GOAL MET             OT Long Term Goals - 09/07/15 1036    OT LONG TERM GOAL #1   Title Pt independent and 100% with all Intensive/Management phase LE self care protocols, including compression wrapping/ garment wear and care, lymphatic pumping ther ex, skin care, ther ex, by DC to limit infection risk , LE progression and further functional decline.   Baseline dependent  Time 12   Period Weeks   Status Achieved   OT LONG TERM GOAL #2   Title Pt able to don/doff compression garments using assistive devices PRN on issue date, by DC to limit infection risk , LE progression and further functional decline.   Baseline dependent   Time 12   Status Achieved   OT LONG TERM GOAL #3   Title RLE limb volume reduction achieved by end of intensive as evidenced by 2 cm decrease at all calf landmarks.   Baseline dependent   Time 12   Period Weeks   Status Achieved   OT LONG TERM GOAL #4   Title Skin integrity to improve by palpable reduction in tissue density and resolution of pitting is medically necessary to limit infection risk and LE progression.   Baseline dependent   Time 12   Period Weeks   Status Not Met   OT LONG  TERM GOAL #5   Title BLE limb volume reductions achieved during Intensive Phase CDT to be retained for 3 months in effort to limit LE progression, infection risk, and further functional decline    Baseline SBA   Time 3   Period Months   Status Achieved               Plan - 09-29-2015 1119    Clinical Impression Statement Pt successfully completed Intensive Phase Complete Decongestive Therapy for BLE lymphedema on August 22, 2015. Upon assessment for follow up on Management Phase condition and goals, Pt has met all but one long term OT goals. She is presntly independent and 100% w/ all LE self care protocols, including simple self-MLD, skin care, compression garment wear and care, and ther ex. Garments are in good condition. Pt understands and observes all LE precautions and instructions. Comparative limb volumetrics measured today reveal overall RLE limb volume reduction from toes to groin (A-G) of 5.35% since commencing  OT for CDT. RLE limb volume  reduction below the knee (A-D) reveals a n overall  decrease of 2.58% . Since last visit RLE is dramatically decreased below the knee by 5..15%. Limb volume differential currently reveals 1.8%, R>L, which is WNL for RLE dominance. LLE volumetrics reveal increased volume below the knee of 2.55% and from AG of 2.3% overall. These values are somewhat misleading though, as visually limb volumes appear muchh improved. Dense fibrosis below the knees extending to ankles persists and is lasrgey unchanged since commencing Rx. Pain is occasional now vs constant and severity is minimal by report. Pt is very pleased with results and feels she his able to "do a whole lot more"and participate in family and community activities without limitations. She agrees with plan to DC OT today. She will call as needed w/ questions and/or concerns.    Rehab Potential Good   OT Frequency Other (comment)  DC OT today. Cont to support management phase CDT PRN   Plan DC OT.  Alll except for one goal met. Dense fibrosis persists in distal legs and ankles. Cont to support managment phase CDT PRN   Consulted and Agree with Plan of Care Patient          G-Codes - 09/29/15 1117    Functional Assessment Tool Used clinical observation, volumetric and anatomical measurement, interview   Functional Limitation Self care   Self Care Current Status ([id number]) 0 percent impaired, limited or restricted   Self Care Discharge Status [phone]) 0 percent impaired, limited or restricted      Problem List  There are no active problems to display for this patient.  Andrey Spearman, MS, OTR/L, [facility] 09/07/2015 11:36 AM   [place] MAIN [facility] SERVICES [address] [place], [place], [address] Phone: [phone]   Fax:  [phone]

## 2015-09-07 NOTE — Patient Instructions (Signed)
Lymphedema Precautions  If you experience atypical shortness of breath, or notice any signs /symptoms of skin infection (aka cellulitis) remove all compression wraps/ garments, discontinue manual lymphatic drainage (MLD), and report symptoms to your physician immediately. Discontinue MLD and compression for 72 hours after you take your first oral antibiotic so not to spread the infection.   Lymphedema Self- Care Instructions  1. EXERCISE: Perform lymphatic pumping there ex 2 x a day. While wearing your compression wraps or garments. Perform 10 reps of each exercise bilaterally and be sure to perform them in order. Don;t skip around!  OMIT PARTIAL SIT UP  2. MLD: Perform simple self-Manual Lymphatic Drainage (MLD) at least once a day as directed.  3. WRAPS: Compression wraps are to be worn 23 hrs/ 7 days/wk during Intensive Phase of Complete Decongestive Therapy (CDT).Building tolerance may take time and practice, so don't get discouraged. If bandages begin to feel tight during periods of inactivity and/or during the night, try performing your exercises to loosen them.   4. GARMENTS: During Management Phase CDT your compression garments are to be worn during waking hours when active. Do NOT sleep in your garments!! Replace q 3-6 months and PRN.  5. PUT YOUR FEET UP! Elevate your feet and legs and feet to the level of your heart whenever you are sitting down.   6. SKIN: Carefully monitor skin condition and perform impeccable hygiene daily. Bathe skin with mild soap and water and apply low pH lotion (aka Eucerin ) to improve hydration and limit infection risk.    Lymphatic Pumping Exercises:

## 2016-08-15 ENCOUNTER — Other Ambulatory Visit: Payer: Self-pay | Admitting: Internal Medicine

## 2016-08-15 DIAGNOSIS — Z1231 Encounter for screening mammogram for malignant neoplasm of breast: Secondary | ICD-10-CM

## 2016-08-19 ENCOUNTER — Ambulatory Visit
Admission: RE | Admit: 2016-08-19 | Discharge: 2016-08-19 | Disposition: A | Discharge status: Home / Self Care | Payer: Medicare Other | Source: Ambulatory Visit | Attending: Internal Medicine | Admitting: Internal Medicine

## 2016-08-19 DIAGNOSIS — Z1231 Encounter for screening mammogram for malignant neoplasm of breast: Secondary | ICD-10-CM | POA: Diagnosis not present

## 2017-01-20 DIAGNOSIS — M19012 Primary osteoarthritis, left shoulder: Secondary | ICD-10-CM | POA: Insufficient documentation

## 2017-07-03 ENCOUNTER — Other Ambulatory Visit: Payer: Self-pay | Admitting: Internal Medicine

## 2017-07-03 DIAGNOSIS — R42 Dizziness and giddiness: Secondary | ICD-10-CM

## 2017-07-03 DIAGNOSIS — R51 Headache: Secondary | ICD-10-CM

## 2017-07-03 DIAGNOSIS — R519 Headache, unspecified: Secondary | ICD-10-CM

## 2017-07-10 ENCOUNTER — Ambulatory Visit
Admission: RE | Admit: 2017-07-10 | Discharge: 2017-07-10 | Disposition: A | Discharge status: Home / Self Care | Payer: Medicare Other | Source: Ambulatory Visit | Attending: Internal Medicine | Admitting: Internal Medicine

## 2017-07-10 DIAGNOSIS — R51 Headache: Secondary | ICD-10-CM | POA: Insufficient documentation

## 2017-07-10 DIAGNOSIS — R519 Headache, unspecified: Secondary | ICD-10-CM

## 2017-07-10 DIAGNOSIS — R42 Dizziness and giddiness: Secondary | ICD-10-CM | POA: Insufficient documentation

## 2017-07-14 ENCOUNTER — Other Ambulatory Visit: Payer: Self-pay | Admitting: Internal Medicine

## 2017-07-14 DIAGNOSIS — Z1231 Encounter for screening mammogram for malignant neoplasm of breast: Secondary | ICD-10-CM

## 2017-08-25 ENCOUNTER — Ambulatory Visit
Admission: RE | Admit: 2017-08-25 | Discharge: 2017-08-25 | Disposition: A | Discharge status: Home / Self Care | Payer: Medicare Other | Source: Ambulatory Visit | Attending: Internal Medicine | Admitting: Internal Medicine

## 2017-08-25 DIAGNOSIS — Z1231 Encounter for screening mammogram for malignant neoplasm of breast: Secondary | ICD-10-CM | POA: Diagnosis present

## 2018-01-15 ENCOUNTER — Other Ambulatory Visit: Payer: Self-pay | Admitting: Internal Medicine

## 2018-01-15 DIAGNOSIS — R1310 Dysphagia, unspecified: Secondary | ICD-10-CM

## 2018-01-26 ENCOUNTER — Ambulatory Visit
Admission: RE | Admit: 2018-01-26 | Discharge: 2018-01-26 | Disposition: A | Discharge status: Home / Self Care | Payer: Medicare Other | Source: Ambulatory Visit | Attending: Internal Medicine | Admitting: Internal Medicine

## 2018-01-26 DIAGNOSIS — K449 Diaphragmatic hernia without obstruction or gangrene: Secondary | ICD-10-CM | POA: Diagnosis not present

## 2018-01-26 DIAGNOSIS — K219 Gastro-esophageal reflux disease without esophagitis: Secondary | ICD-10-CM | POA: Insufficient documentation

## 2018-01-26 DIAGNOSIS — R1310 Dysphagia, unspecified: Secondary | ICD-10-CM | POA: Diagnosis not present

## 2018-05-19 ENCOUNTER — Other Ambulatory Visit: Payer: Self-pay | Admitting: Physician Assistant

## 2018-05-19 DIAGNOSIS — I809 Phlebitis and thrombophlebitis of unspecified site: Secondary | ICD-10-CM

## 2018-05-20 ENCOUNTER — Ambulatory Visit
Admission: RE | Admit: 2018-05-20 | Discharge: 2018-05-20 | Disposition: A | Discharge status: Home / Self Care | Payer: Medicare Other | Source: Ambulatory Visit | Attending: Physician Assistant | Admitting: Physician Assistant

## 2018-05-20 DIAGNOSIS — I809 Phlebitis and thrombophlebitis of unspecified site: Secondary | ICD-10-CM | POA: Diagnosis not present

## 2018-06-01 ENCOUNTER — Encounter: Payer: Self-pay | Admitting: *Deleted

## 2018-06-02 ENCOUNTER — Ambulatory Visit: Payer: Medicare Other | Admitting: Certified Registered Nurse Anesthetist

## 2018-06-02 ENCOUNTER — Encounter
Admission: RE | Disposition: A | Discharge status: Home / Self Care | Payer: Self-pay | Source: Ambulatory Visit | Attending: Internal Medicine

## 2018-06-02 ENCOUNTER — Encounter: Payer: Self-pay | Admitting: Certified Registered Nurse Anesthetist

## 2018-06-02 ENCOUNTER — Ambulatory Visit
Admission: RE | Admit: 2018-06-02 | Discharge: 2018-06-02 | Disposition: A | Discharge status: Home / Self Care | Payer: Medicare Other | Source: Ambulatory Visit | Attending: Internal Medicine | Admitting: Internal Medicine

## 2018-06-02 ENCOUNTER — Other Ambulatory Visit: Payer: Self-pay

## 2018-06-02 DIAGNOSIS — R1314 Dysphagia, pharyngoesophageal phase: Secondary | ICD-10-CM | POA: Insufficient documentation

## 2018-06-02 DIAGNOSIS — Z8371 Family history of colonic polyps: Secondary | ICD-10-CM | POA: Insufficient documentation

## 2018-06-02 DIAGNOSIS — Z79899 Other long term (current) drug therapy: Secondary | ICD-10-CM | POA: Insufficient documentation

## 2018-06-02 DIAGNOSIS — K640 First degree hemorrhoids: Secondary | ICD-10-CM | POA: Insufficient documentation

## 2018-06-02 DIAGNOSIS — K209 Esophagitis, unspecified: Secondary | ICD-10-CM | POA: Diagnosis not present

## 2018-06-02 DIAGNOSIS — K219 Gastro-esophageal reflux disease without esophagitis: Secondary | ICD-10-CM | POA: Diagnosis not present

## 2018-06-02 DIAGNOSIS — E039 Hypothyroidism, unspecified: Secondary | ICD-10-CM | POA: Diagnosis not present

## 2018-06-02 DIAGNOSIS — I10 Essential (primary) hypertension: Secondary | ICD-10-CM | POA: Diagnosis not present

## 2018-06-02 DIAGNOSIS — E785 Hyperlipidemia, unspecified: Secondary | ICD-10-CM | POA: Insufficient documentation

## 2018-06-02 DIAGNOSIS — K5730 Diverticulosis of large intestine without perforation or abscess without bleeding: Secondary | ICD-10-CM | POA: Insufficient documentation

## 2018-06-02 DIAGNOSIS — Z7982 Long term (current) use of aspirin: Secondary | ICD-10-CM | POA: Diagnosis not present

## 2018-06-02 DIAGNOSIS — Z1211 Encounter for screening for malignant neoplasm of colon: Secondary | ICD-10-CM | POA: Diagnosis present

## 2018-06-02 HISTORY — PX: ESOPHAGOGASTRODUODENOSCOPY (EGD) WITH PROPOFOL: SHX5813

## 2018-06-02 HISTORY — DX: Hyperlipidemia, unspecified: E78.5

## 2018-06-02 HISTORY — DX: Allergic rhinitis, unspecified: J30.9

## 2018-06-02 HISTORY — DX: Hypothyroidism, unspecified: E03.9

## 2018-06-02 HISTORY — DX: Other specified disorders of veins: I87.8

## 2018-06-02 HISTORY — DX: Obesity, unspecified: E66.9

## 2018-06-02 HISTORY — PX: COLONOSCOPY WITH PROPOFOL: SHX5780

## 2018-06-02 HISTORY — DX: Essential (primary) hypertension: I10

## 2018-06-02 HISTORY — DX: Unspecified osteoarthritis, unspecified site: M19.90

## 2018-06-02 HISTORY — DX: Personal history of colonic polyps: Z86.010

## 2018-06-02 HISTORY — DX: Gastro-esophageal reflux disease without esophagitis: K21.9

## 2018-06-02 HISTORY — DX: Personal history of colon polyps, unspecified: Z86.0100

## 2018-06-02 SURGERY — ESOPHAGOGASTRODUODENOSCOPY (EGD) WITH PROPOFOL
Anesthesia: General

## 2018-06-02 MED ORDER — SODIUM CHLORIDE 0.9 % IV SOLN
INTRAVENOUS | Status: DC
  Administered 2018-06-02 (×2): via INTRAVENOUS

## 2018-06-02 MED ORDER — PROPOFOL 10 MG/ML IV BOLUS
INTRAVENOUS | Status: DC | PRN
  Administered 2018-06-02: 60 mg via INTRAVENOUS

## 2018-06-02 MED ORDER — LIDOCAINE HCL (PF) 2 % IJ SOLN
INTRAMUSCULAR | Status: AC
  Filled 2018-06-02: qty 10

## 2018-06-02 MED ORDER — PROPOFOL 500 MG/50ML IV EMUL
INTRAVENOUS | Status: AC
  Filled 2018-06-02: qty 50

## 2018-06-02 MED ORDER — LIDOCAINE HCL (CARDIAC) PF 100 MG/5ML IV SOSY
PREFILLED_SYRINGE | INTRAVENOUS | Status: DC | PRN
  Administered 2018-06-02: 50 mg via INTRAVENOUS

## 2018-06-02 MED ORDER — PROPOFOL 500 MG/50ML IV EMUL
INTRAVENOUS | Status: DC | PRN
  Administered 2018-06-02: 150 ug/kg/min via INTRAVENOUS

## 2018-06-02 NOTE — Anesthesia Preprocedure Evaluation (Signed)
Anesthesia Evaluation  Patient identified by MRN, date of birth, ID band Patient awake    Reviewed: Allergy & Precautions, H&P , NPO status , Patient's Chart, lab work & pertinent test results  History of Anesthesia Complications Negative for: history of anesthetic complications  Airway Mallampati: III  TM Distance: <3 FB Neck ROM: limited    Dental  (+) Chipped, Poor Dentition   Pulmonary neg pulmonary ROS, neg shortness of breath,           Cardiovascular Exercise Tolerance: Good hypertension, (-) angina(-) Past MI and (-) DOE      Neuro/Psych negative neurological ROS  negative psych ROS   GI/Hepatic Neg liver ROS, GERD  Medicated and Controlled,  Endo/Other  Hypothyroidism   Renal/GU negative Renal ROS  negative genitourinary   Musculoskeletal  (+) Arthritis ,   Abdominal   Peds  Hematology negative hematology ROS (+)   Anesthesia Other Findings Past Medical History: No date: Allergic rhinitis No date: GERD (gastroesophageal reflux disease) No date: History of colon polyps No date: Hyperlipidemia No date: Hypertension No date: Hypothyroidism No date: Obesity No date: Osteoarthritis No date: Venous stasis  Past Surgical History: No date: ABDOMINAL HYSTERECTOMY No date: ANKLE ARTHROSCOPY No date: BACK SURGERY No date: carpal tunnell release     Comment:  done endoscopic No date: CHOLECYSTECTOMY No date: EYE SURGERY No date: JOINT REPLACEMENT No date: TUBAL LIGATION  BMI    Body Mass Index:  40.62 kg/m      Reproductive/Obstetrics negative OB ROS                             Anesthesia Physical Anesthesia Plan  ASA: III  Anesthesia Plan: General   Post-op Pain Management:    Induction: Intravenous  PONV Risk Score and Plan: Propofol infusion and TIVA  Airway Management Planned: Natural Airway and Nasal Cannula  Additional Equipment:   Intra-op Plan:    Post-operative Plan:   Informed Consent: I have reviewed the patients History and Physical, chart, labs and discussed the procedure including the risks, benefits and alternatives for the proposed anesthesia with the patient or authorized representative who has indicated his/her understanding and acceptance.   Dental Advisory Given  Plan Discussed with: Anesthesiologist, CRNA and Surgeon  Anesthesia Plan Comments: (Patient consented for risks of anesthesia including but not limited to:  - adverse reactions to medications - risk of intubation if required - damage to teeth, lips or other oral mucosa - sore throat or hoarseness - Damage to heart, brain, lungs or loss of life  Patient voiced understanding.)        Anesthesia Quick Evaluation

## 2018-06-02 NOTE — Op Note (Signed)
[facility] Gastroenterology Patient Name: Janet LesserGaylene Galvan Procedure Date: 06/02/2018 12:23 PM MRN: [id number] Account #: [id number] Date of Birth: June 01, 1946 Admit Type: Outpatient Age: [age] [facility] 3 Gender: Female Note Status: Finalized Procedure:            Upper GI endoscopy Indications:          Esophageal dysphagia Providers:            Boykin Nearingeodoro K. Norma Fredricksonoledo MD, MD Referring MD:         Duane LopeJeffrey D. Judithann SheenSparks, MD (Referring MD) Medicines:            Propofol per Anesthesia Complications:        No immediate complications. Procedure:            Pre-Anesthesia Assessment:                       - The risks and benefits of the procedure and the                        sedation options and risks were discussed with the                        patient. All questions were answered and informed                        consent was obtained.                       - Patient identification and proposed procedure were                        verified prior to the procedure by the nurse. The                        procedure was verified in the procedure room.                       - ASA Grade Assessment: III - A patient with severe                        systemic disease.                       - After reviewing the risks and benefits, the patient                        was deemed in satisfactory condition to undergo the                        procedure.                       After obtaining informed consent, the endoscope was                        passed under direct vision. Throughout the procedure,                        the patient's blood pressure, pulse, and oxygen  saturations were monitored continuously. The Endoscope                        was introduced through the mouth, and advanced to the                        third part of duodenum. The upper GI endoscopy was                        accomplished without difficulty. The patient tolerated                         the procedure well. Findings:      No endoscopic abnormality was evident in the esophagus to explain the       patient's complaint of dysphagia. Biopsies were obtained from the       proximal and distal esophagus with cold forceps for histology of       suspected eosinophilic esophagitis.      The entire examined stomach was normal.      The examined duodenum was normal. Impression:           - No endoscopic esophageal abnormality to explain                        patient's dysphagia. Biopsied.                       - Normal stomach.                       - Normal examined duodenum. Recommendation:       - Await pathology results.                       - Proceed with colonoscopy Procedure Code(s):    --- Professional ---                       [phone], Esophagogastroduodenoscopy, flexible, transoral;                        with biopsy, single or multiple Diagnosis Code(s):    --- Professional ---                       R13.14, Dysphagia, pharyngoesophageal phase CPT copyright 2017 American Medical Association. All rights reserved. The codes documented in this report are preliminary and upon coder review may  be revised to meet current compliance requirements. [name] MD, MD 06/02/2018 12:56:18 PM This report has been signed electronically. Number of Addenda: 0 Note Initiated On: 06/02/2018 12:23 PM      [facility]

## 2018-06-02 NOTE — Anesthesia Post-op Follow-up Note (Signed)
Anesthesia QCDR form completed.        

## 2018-06-02 NOTE — Op Note (Signed)
[facility] Gastroenterology Patient Name: Janet Galvan Procedure Date: 06/02/2018 12:22 PM MRN: [id number] Account #: [id number] Date of Birth: 1946-03-03 Admit Type: Outpatient Age: 72 Room: [facility] ENDO ROOM 3 Gender: Female Note Status: Finalized Procedure:            Colonoscopy Indications:          Colon cancer screening in patient at increased risk:                        Family history of 1st-degree relative with colon polyps Providers:            Boykin Nearing. Norma Fredrickson MD, MD Referring MD:         Duane Lope. Judithann Sheen, MD (Referring MD) Medicines:            Propofol per Anesthesia Complications:        No immediate complications. Procedure:            Pre-Anesthesia Assessment:                       - The risks and benefits of the procedure and the                        sedation options and risks were discussed with the                        patient. All questions were answered and informed                        consent was obtained.                       - Patient identification and proposed procedure were                        verified prior to the procedure by the nurse. The                        procedure was verified in the procedure room.                       - ASA Grade Assessment: III - A patient with severe                        systemic disease.                       - After reviewing the risks and benefits, the patient                        was deemed in satisfactory condition to undergo the                        procedure.                       After obtaining informed consent, the colonoscope was                        passed under direct vision. Throughout the procedure,  the patient's blood pressure, pulse, and oxygen                        saturations were monitored continuously. The                        Colonoscope was introduced through the anus and                        advanced to the the cecum, identified  by appendiceal                        orifice and ileocecal valve. The colonoscopy was                        performed without difficulty. The patient tolerated the                        procedure well. The quality of the bowel preparation                        was good. The ileocecal valve, appendiceal orifice, and                        rectum were photographed. Findings:      The perianal and digital rectal examinations were normal. Pertinent       negatives include normal sphincter tone and no palpable rectal lesions.      A few small-mouthed diverticula were found in the sigmoid colon.      Non-bleeding internal hemorrhoids were found during retroflexion. The       hemorrhoids were Grade I (internal hemorrhoids that do not prolapse).      The exam was otherwise without abnormality. Impression:           - Diverticulosis in the sigmoid colon.                       - Non-bleeding internal hemorrhoids.                       - The examination was otherwise normal.                       - No specimens collected. Recommendation:       - Patient has a contact number available for                        emergencies. The signs and symptoms of potential                        delayed complications were discussed with the patient.                        Return to normal activities tomorrow. Written discharge                        instructions were provided to the patient.                       - Resume previous diet.                       -  Await pathology results from EGD, also performed                        today.                       - Continue present medications.                       - No repeat colonoscopy due to current age 56(66 years or                        older).                       - Return to my office in 6 months.                       - The findings and recommendations were discussed with                        the patient and their family. Procedure Code(s):    ---  Professional ---                       [id number], Colorectal cancer screening; colonoscopy on                        individual at high risk Diagnosis Code(s):    --- Professional ---                       K57.30, Diverticulosis of large intestine without                        perforation or abscess without bleeding                       K64.0, First degree hemorrhoids                       Z83.71, Family history of colonic polyps CPT copyright 2017 American Medical Association. All rights reserved. The codes documented in this report are preliminary and upon coder review may  be revised to meet current compliance requirements. Stanton Kidneyeodoro K Toledo MD, MD 06/02/2018 1:09:55 PM This report has been signed electronically. Number of Addenda: 0 Note Initiated On: 06/02/2018 12:22 PM Scope Withdrawal Time: 0 hours 6 minutes 16 seconds  Total Procedure Duration: 0 hours 8 minutes 35 seconds       [facility]

## 2018-06-02 NOTE — Anesthesia Procedure Notes (Signed)
Date/Time: 06/02/2018 12:45 PM Performed by: Ginger CarneMichelet, Lilymae Swiech, CRNA Pre-anesthesia Checklist: Patient identified, Emergency Drugs available, Suction available, Patient being monitored and Timeout performed Patient Re-evaluated:Patient Re-evaluated prior to induction Oxygen Delivery Method: Nasal cannula Preoxygenation: Pre-oxygenation with 100% oxygen

## 2018-06-02 NOTE — Interval H&P Note (Signed)
History and Physical Interval Note:  06/02/2018 12:39 PM  Janet Galvan  has presented today for surgery, with the diagnosis of Dysphagia FH polyps  The various methods of treatment have been discussed with the patient and family. After consideration of risks, benefits and other options for treatment, the patient has consented to  Procedure(s): ESOPHAGOGASTRODUODENOSCOPY (EGD) WITH PROPOFOL (N/A) COLONOSCOPY WITH PROPOFOL (N/A) as a surgical intervention .  The patient's history has been reviewed, patient examined, no change in status, stable for surgery.  I have reviewed the patient's chart and labs.  Questions were answered to the patient's satisfaction.     Soqueloledo, Hoopereodoro

## 2018-06-02 NOTE — Transfer of Care (Signed)
Immediate Anesthesia Transfer of Care Note  Patient: Janet Galvan  Procedure(s) Performed: ESOPHAGOGASTRODUODENOSCOPY (EGD) WITH PROPOFOL (N/A ) COLONOSCOPY WITH PROPOFOL (N/A )  Patient Location: PACU  Anesthesia Type:General  Level of Consciousness: awake and alert   Airway & Oxygen Therapy: Patient Spontanous Breathing and Patient connected to nasal cannula oxygen  Post-op Assessment: Report given to RN and Post -op Vital signs reviewed and stable  Post vital signs: Reviewed and stable  Last Vitals:  Vitals Value Taken Time  BP 122/69 06/02/2018  1:14 PM  Temp 36.1 C 06/02/2018  1:14 PM  Pulse 77 06/02/2018  1:14 PM  Resp 18 06/02/2018  1:14 PM  SpO2 100 % 06/02/2018  1:14 PM    Last Pain:  Vitals:   06/02/18 1314  TempSrc: Tympanic  PainSc:          Complications: No apparent anesthesia complications

## 2018-06-02 NOTE — H&P (Signed)
Outpatient short stay form Pre-procedure 06/02/2018 12:37 PM Teodoro K. Norma Fredrickson, M.D.  Primary Physician: Aram Beecham, M.D.  Reason for visit: Esophageal dysphagia, Family hx of colon polyps.   History of present illness:  Patient with mild postcholecystectomy diarrhea presents for esophageal dysphagia intermittently to solids. Also is due for increased risk colon cancer screening due to family history of colon polyps.     Current Facility-Administered Medications:  .  0.9 %  sodium chloride infusion, , Intravenous, Continuous, Toledo, Boykin Nearing, MD  Medications Prior to Admission  Medication Sig Dispense Refill Last Dose  . aspirin EC 81 MG tablet Take 81 mg by mouth daily.   06/01/2018 at 1000  . celecoxib (CELEBREX) 200 MG capsule Take 200 mg by mouth 2 (two) times daily.   06/01/2018 at 1000  . cholestyramine (QUESTRAN) 4 g packet Take 4 g by mouth 3 (three) times daily with meals.   06/01/2018 at 1000  . DULoxetine (CYMBALTA) 30 MG capsule Take 30 mg by mouth daily.   06/01/2018 at 1000  . estradiol (ESTRACE) 1 MG tablet Take 1 mg by mouth daily.   06/01/2018 at 1000  . gabapentin (NEURONTIN) 100 MG capsule Take 100 mg by mouth 3 (three) times daily.   06/01/2018 at 1000  . levothyroxine (SYNTHROID, LEVOTHROID) 125 MCG tablet Take 125 mcg by mouth daily before breakfast.   06/01/2018 at 1000  . pantoprazole (PROTONIX) 40 MG tablet Take 40 mg by mouth daily.   06/01/2018 at 1000  . potassium chloride SA (K-DUR,KLOR-CON) 20 MEQ tablet Take 20 mEq by mouth 5 (five) times daily.   06/01/2018 at 1000  . rosuvastatin (CRESTOR) 20 MG tablet Take 20 mg by mouth daily.   06/01/2018 at 1000  . topiramate (TOPAMAX) 25 MG capsule Take 25 mg by mouth 2 (two) times daily.   06/01/2018 at 1000  . diclofenac sodium (VOLTAREN) 1 % GEL Apply 2 g topically 2 times daily at 12 noon and 4 pm.   Completed Course at Unknown time  . mometasone (ELOCON) 0.1 % cream Apply 1 application topically 2 (two) times daily.   Completed  Course at Unknown time  . triamcinolone cream (KENALOG) 0.5 % Apply 1 application topically 2 (two) times daily.   Completed Course at Unknown time     Allergies  Allergen Reactions  . Augmentin [Amoxicillin-Pot Clavulanate] Other (See Comments)  . Latex Other (See Comments)  . Morphine And Related Other (See Comments)  . Orudis [Ketoprofen] Other (See Comments)  . Oxycodone Other (See Comments)     Past Medical History:  Diagnosis Date  . Allergic rhinitis   . GERD (gastroesophageal reflux disease)   . History of colon polyps   . Hyperlipidemia   . Hypertension   . Hypothyroidism   . Obesity   . Osteoarthritis   . Venous stasis     Review of systems:  Otherwise negative.    Physical Exam  Gen: Alert, oriented. Appears stated age.  HEENT: [place]/AT. PERRLA. Lungs: CTA, no wheezes. CV: RR nl S1, S2. Abd: soft, benign, no masses. BS+ Ext: No edema. Pulses 2+    Planned procedures: Proceed with EGD and colonoscopy. The patient understands the nature of the planned procedure, indications, risks, alternatives and potential complications including but not limited to bleeding, infection, perforation, damage to internal organs and possible oversedation/side effects from anesthesia. The patient agrees and gives consent to proceed.  Please refer to procedure notes for findings, recommendations and patient disposition/instructions.  Teodoro K. Norma Fredricksonoledo, M.D. Gastroenterology 06/02/2018  12:37 PM

## 2018-06-03 LAB — SURGICAL PATHOLOGY

## 2018-06-03 NOTE — Anesthesia Postprocedure Evaluation (Signed)
Anesthesia Post Note  Patient: Janet Galvan  Procedure(s) Performed: ESOPHAGOGASTRODUODENOSCOPY (EGD) WITH PROPOFOL (N/A ) COLONOSCOPY WITH PROPOFOL (N/A )  Patient location during evaluation: Endoscopy Anesthesia Type: General Level of consciousness: awake and alert Pain management: pain level controlled Vital Signs Assessment: post-procedure vital signs reviewed and stable Respiratory status: spontaneous breathing, nonlabored ventilation, respiratory function stable and patient connected to nasal cannula oxygen Cardiovascular status: blood pressure returned to baseline and stable Postop Assessment: no apparent nausea or vomiting Anesthetic complications: no     Last Vitals:  Vitals:   06/02/18 1345 06/02/18 1400  BP: 122/62 111/65  Pulse: 63 62  Resp: 20 13  Temp:    SpO2: 100% 98%    Last Pain:  Vitals:   06/03/18 0735  TempSrc:   PainSc: 0-No pain                 Cleda MccreedyJoseph K Thelia Tanksley

## 2018-09-16 ENCOUNTER — Other Ambulatory Visit: Payer: Self-pay

## 2018-09-16 ENCOUNTER — Encounter: Payer: Self-pay | Admitting: *Deleted

## 2018-09-21 ENCOUNTER — Other Ambulatory Visit: Payer: Self-pay | Admitting: Internal Medicine

## 2018-09-21 DIAGNOSIS — Z1231 Encounter for screening mammogram for malignant neoplasm of breast: Secondary | ICD-10-CM

## 2018-09-23 NOTE — Discharge Instructions (Signed)

## 2018-09-28 ENCOUNTER — Encounter
Admission: RE | Disposition: A | Discharge status: Home / Self Care | Payer: Self-pay | Source: Ambulatory Visit | Attending: Ophthalmology

## 2018-09-28 ENCOUNTER — Ambulatory Visit: Payer: Medicare Other | Admitting: Anesthesiology

## 2018-09-28 ENCOUNTER — Ambulatory Visit
Admission: RE | Admit: 2018-09-28 | Discharge: 2018-09-28 | Disposition: A | Discharge status: Home / Self Care | Payer: Medicare Other | Source: Ambulatory Visit | Attending: Ophthalmology | Admitting: Ophthalmology

## 2018-09-28 DIAGNOSIS — Z79890 Hormone replacement therapy (postmenopausal): Secondary | ICD-10-CM | POA: Diagnosis not present

## 2018-09-28 DIAGNOSIS — Z96652 Presence of left artificial knee joint: Secondary | ICD-10-CM | POA: Insufficient documentation

## 2018-09-28 DIAGNOSIS — I10 Essential (primary) hypertension: Secondary | ICD-10-CM | POA: Insufficient documentation

## 2018-09-28 DIAGNOSIS — Z91040 Latex allergy status: Secondary | ICD-10-CM | POA: Insufficient documentation

## 2018-09-28 DIAGNOSIS — Z885 Allergy status to narcotic agent status: Secondary | ICD-10-CM | POA: Diagnosis not present

## 2018-09-28 DIAGNOSIS — E7800 Pure hypercholesterolemia, unspecified: Secondary | ICD-10-CM | POA: Diagnosis not present

## 2018-09-28 DIAGNOSIS — K449 Diaphragmatic hernia without obstruction or gangrene: Secondary | ICD-10-CM | POA: Insufficient documentation

## 2018-09-28 DIAGNOSIS — Z79899 Other long term (current) drug therapy: Secondary | ICD-10-CM | POA: Diagnosis not present

## 2018-09-28 DIAGNOSIS — Z7982 Long term (current) use of aspirin: Secondary | ICD-10-CM | POA: Diagnosis not present

## 2018-09-28 DIAGNOSIS — H2511 Age-related nuclear cataract, right eye: Secondary | ICD-10-CM | POA: Insufficient documentation

## 2018-09-28 DIAGNOSIS — M1990 Unspecified osteoarthritis, unspecified site: Secondary | ICD-10-CM | POA: Insufficient documentation

## 2018-09-28 DIAGNOSIS — Z981 Arthrodesis status: Secondary | ICD-10-CM | POA: Diagnosis not present

## 2018-09-28 DIAGNOSIS — I890 Lymphedema, not elsewhere classified: Secondary | ICD-10-CM | POA: Diagnosis not present

## 2018-09-28 DIAGNOSIS — Z882 Allergy status to sulfonamides status: Secondary | ICD-10-CM | POA: Diagnosis not present

## 2018-09-28 DIAGNOSIS — Z6841 Body Mass Index (BMI) 40.0 and over, adult: Secondary | ICD-10-CM | POA: Insufficient documentation

## 2018-09-28 DIAGNOSIS — K219 Gastro-esophageal reflux disease without esophagitis: Secondary | ICD-10-CM | POA: Insufficient documentation

## 2018-09-28 DIAGNOSIS — Z791 Long term (current) use of non-steroidal anti-inflammatories (NSAID): Secondary | ICD-10-CM | POA: Diagnosis not present

## 2018-09-28 DIAGNOSIS — E039 Hypothyroidism, unspecified: Secondary | ICD-10-CM | POA: Diagnosis not present

## 2018-09-28 DIAGNOSIS — Z8614 Personal history of Methicillin resistant Staphylococcus aureus infection: Secondary | ICD-10-CM | POA: Diagnosis not present

## 2018-09-28 HISTORY — DX: Lymphedema, not elsewhere classified: I89.0

## 2018-09-28 HISTORY — DX: Personal history of other diseases of the digestive system: Z87.19

## 2018-09-28 HISTORY — PX: CATARACT EXTRACTION W/PHACO: SHX586

## 2018-09-28 SURGERY — PHACOEMULSIFICATION, CATARACT, WITH IOL INSERTION
Anesthesia: Monitor Anesthesia Care | Site: Eye | Laterality: Right

## 2018-09-28 MED ORDER — SODIUM HYALURONATE 23 MG/ML IO SOLN
INTRAOCULAR | Status: DC | PRN
  Administered 2018-09-28: 0.6 mL via INTRAOCULAR

## 2018-09-28 MED ORDER — TETRACAINE HCL 0.5 % OP SOLN
1.0000 [drp] | OPHTHALMIC | Status: DC | PRN
  Administered 2018-09-28 (×2): 1 [drp] via OPHTHALMIC

## 2018-09-28 MED ORDER — EPINEPHRINE PF 1 MG/ML IJ SOLN
INTRAOCULAR | Status: DC | PRN
  Administered 2018-09-28: 95 mL via OPHTHALMIC

## 2018-09-28 MED ORDER — MOXIFLOXACIN HCL 0.5 % OP SOLN
OPHTHALMIC | Status: DC | PRN
  Administered 2018-09-28: 0.2 mL via OPHTHALMIC

## 2018-09-28 MED ORDER — ARMC OPHTHALMIC DILATING DROPS
1.0000 "application " | OPHTHALMIC | Status: DC | PRN
  Administered 2018-09-28 (×3): 1 via OPHTHALMIC

## 2018-09-28 MED ORDER — ONDANSETRON HCL 4 MG/2ML IJ SOLN: 4.0000 mg | Freq: Once | INTRAMUSCULAR | Status: DC | PRN

## 2018-09-28 MED ORDER — FENTANYL CITRATE (PF) 100 MCG/2ML IJ SOLN
INTRAMUSCULAR | Status: DC | PRN
  Administered 2018-09-28: 50 ug via INTRAVENOUS

## 2018-09-28 MED ORDER — LACTATED RINGERS IV SOLN: 10.0000 mL/h | INTRAVENOUS | Status: DC

## 2018-09-28 MED ORDER — MIDAZOLAM HCL 2 MG/2ML IJ SOLN
INTRAMUSCULAR | Status: DC | PRN
  Administered 2018-09-28: 1 mg via INTRAVENOUS

## 2018-09-28 MED ORDER — LIDOCAINE HCL (PF) 2 % IJ SOLN
INTRAOCULAR | Status: DC | PRN
  Administered 2018-09-28: 1 mL via INTRAOCULAR

## 2018-09-28 MED ORDER — SODIUM HYALURONATE 10 MG/ML IO SOLN
INTRAOCULAR | Status: DC | PRN
  Administered 2018-09-28: 0.55 mL via INTRAOCULAR

## 2018-09-28 SURGICAL SUPPLY — 17 items
CANNULA ANT/CHMB 27G (MISCELLANEOUS) ×1 IMPLANT
CANNULA ANT/CHMB 27GA (MISCELLANEOUS) ×3 IMPLANT
DISSECTOR HYDRO NUCLEUS 50X22 (MISCELLANEOUS) ×3 IMPLANT
GLOVE BIO SURGEON STRL SZ8 (GLOVE) ×3 IMPLANT
GLOVE SURG LX 7.5 STRW (GLOVE) ×2
GLOVE SURG LX STRL 7.5 STRW (GLOVE) ×1 IMPLANT
GOWN STRL REUS W/ TWL LRG LVL3 (GOWN DISPOSABLE) ×2 IMPLANT
GOWN STRL REUS W/TWL LRG LVL3 (GOWN DISPOSABLE) ×4
LENS IOL TECNIS ITEC 18.5 (Intraocular Lens) ×2 IMPLANT
MARKER SKIN DUAL TIP RULER LAB (MISCELLANEOUS) ×3 IMPLANT
PACK DR. KING ARMS (PACKS) ×3 IMPLANT
PACK EYE AFTER SURG (MISCELLANEOUS) ×3 IMPLANT
PACK OPTHALMIC (MISCELLANEOUS) ×3 IMPLANT
SYR 3ML LL SCALE MARK (SYRINGE) ×3 IMPLANT
SYR TB 1ML LUER SLIP (SYRINGE) ×3 IMPLANT
WATER STERILE IRR 500ML POUR (IV SOLUTION) ×3 IMPLANT
WIPE NON LINTING 3.25X3.25 (MISCELLANEOUS) ×3 IMPLANT

## 2018-09-28 NOTE — Anesthesia Preprocedure Evaluation (Signed)
Anesthesia Evaluation  Patient identified by MRN, date of birth, ID band Patient awake    Reviewed: Allergy & Precautions, NPO status , Patient's Chart, lab work & pertinent test results  Airway Mallampati: II  TM Distance: >3 FB     Dental   Pulmonary neg pulmonary ROS,    breath sounds clear to auscultation       Cardiovascular hypertension,  Rhythm:Regular Rate:Normal  HLD   Neuro/Psych    GI/Hepatic hiatal hernia, GERD  ,  Endo/Other  Hypothyroidism Morbid obesity  Renal/GU      Musculoskeletal  (+) Arthritis ,   Abdominal   Peds  Hematology   Anesthesia Other Findings   Reproductive/Obstetrics                             Anesthesia Physical  Anesthesia Plan  ASA: III  Anesthesia Plan: MAC   Post-op Pain Management:    Induction: Intravenous  PONV Risk Score and Plan:   Airway Management Planned: Natural Airway and Nasal Cannula  Additional Equipment:   Intra-op Plan:   Post-operative Plan:   Informed Consent: I have reviewed the patients History and Physical, chart, labs and discussed the procedure including the risks, benefits and alternatives for the proposed anesthesia with the patient or authorized representative who has indicated his/her understanding and acceptance.     Plan Discussed with: CRNA  Anesthesia Plan Comments:         Anesthesia Quick Evaluation  

## 2018-09-28 NOTE — H&P (Signed)
The History and Physical notes are on paper, have been signed, and are to be scanned.   I have examined the patient and there are no changes to the H&P.   Willey Blade 09/28/2018 7:17 AM

## 2018-09-28 NOTE — Transfer of Care (Signed)
Immediate Anesthesia Transfer of Care Note  Patient: Janet Galvan  Procedure(s) Performed: CATARACT EXTRACTION PHACO AND INTRAOCULAR LENS PLACEMENT (IOC)  RIGHT (Right Eye)  Patient Location: PACU  Anesthesia Type: MAC  Level of Consciousness: awake, alert  and patient cooperative  Airway and Oxygen Therapy: Patient Spontanous Breathing and Patient connected to supplemental oxygen  Post-op Assessment: Post-op Vital signs reviewed, Patient's Cardiovascular Status Stable, Respiratory Function Stable, Patent Airway and No signs of Nausea or vomiting  Post-op Vital Signs: Reviewed and stable  Complications: No apparent anesthesia complications

## 2018-09-28 NOTE — Op Note (Signed)
OPERATIVE NOTE  DEBE ANFINSON [id number] 09/28/2018   PREOPERATIVE DIAGNOSIS:  Nuclear sclerotic cataract right eye.  H25.11   POSTOPERATIVE DIAGNOSIS:    Nuclear sclerotic cataract right eye.     PROCEDURE:  Phacoemusification with posterior chamber intraocular lens placement of the right eye   LENS:   Implant Name Type Inv. Item Serial No. Manufacturer Lot No. LRB No. Used  LENS IOL DIOP 18.5 - [id number] Intraocular Lens LENS IOL DIOP 18.5 [id number] AMO  Right 1       PCB00 +18.5   ULTRASOUND TIME: 0 minutes 44 seconds.  CDE 7.48   SURGEON:  Willey Blade, MD, MPH  ANESTHESIOLOGIST: Anesthesiologist: Jola Babinski, MD CRNA: Maree Krabbe, CRNA   ANESTHESIA:  Topical with tetracaine drops augmented with 1% preservative-free intracameral lidocaine.  ESTIMATED BLOOD LOSS: less than 1 mL.   COMPLICATIONS:  None.   DESCRIPTION OF PROCEDURE:  The patient was identified in the holding room and transported to the operating room and placed in the supine position under the operating microscope.  The right eye was identified as the operative eye and it was prepped and draped in the usual sterile ophthalmic fashion.   A 1.0 millimeter clear-corneal paracentesis was made at the 10:30 position. 0.5 ml of preservative-free 1% lidocaine with epinephrine was injected into the anterior chamber.  The anterior chamber was filled with Healon 5 viscoelastic.  A 2.4 millimeter keratome was used to make a near-clear corneal incision at the 8:00 position.  A curvilinear capsulorrhexis was made with a cystotome and capsulorrhexis forceps.  Balanced salt solution was used to hydrodissect and hydrodelineate the nucleus.   Phacoemulsification was then used in stop and chop fashion to remove the lens nucleus and epinucleus.  The remaining cortex was then removed using the irrigation and aspiration handpiece. Healon was then placed into the capsular bag to distend it for lens placement.  A lens was  then injected into the capsular bag.  The remaining viscoelastic was aspirated.   Wounds were hydrated with balanced salt solution.  The anterior chamber was inflated to a physiologic pressure with balanced salt solution.   Intracameral vigamox 0.1 mL undiluted was injected into the eye and a drop placed onto the ocular surface.  No wound leaks were noted.  The patient was taken to the recovery room in stable condition without complications of anesthesia or surgery  Willey Blade 09/28/2018, 7:54 AM

## 2018-09-28 NOTE — Anesthesia Procedure Notes (Signed)
Procedure Name: [place] Performed by: Cameron Ali, CRNA Pre-anesthesia Checklist: Patient identified, Emergency Drugs available, Suction available, Timeout performed and Patient being monitored Patient Re-evaluated:Patient Re-evaluated prior to induction Oxygen Delivery Method: Nasal cannula Placement Confirmation: positive ETCO2

## 2018-09-28 NOTE — Anesthesia Postprocedure Evaluation (Signed)
Anesthesia Post Note  Patient: Janet Galvan  Procedure(s) Performed: CATARACT EXTRACTION PHACO AND INTRAOCULAR LENS PLACEMENT (IOC)  RIGHT (Right Eye)  Patient location during evaluation: PACU Anesthesia Type: MAC Level of consciousness: awake and alert Pain management: pain level controlled Vital Signs Assessment: post-procedure vital signs reviewed and stable Respiratory status: spontaneous breathing, nonlabored ventilation, respiratory function stable and patient connected to nasal cannula oxygen Cardiovascular status: stable and blood pressure returned to baseline Postop Assessment: no apparent nausea or vomiting Anesthetic complications: no    Veda Canning

## 2018-10-12 ENCOUNTER — Other Ambulatory Visit: Payer: Self-pay

## 2018-10-12 ENCOUNTER — Encounter: Payer: Self-pay | Admitting: *Deleted

## 2018-10-15 ENCOUNTER — Ambulatory Visit
Admission: RE | Admit: 2018-10-15 | Discharge: 2018-10-15 | Disposition: A | Discharge status: Home / Self Care | Payer: Medicare Other | Source: Ambulatory Visit | Attending: Internal Medicine | Admitting: Internal Medicine

## 2018-10-15 DIAGNOSIS — Z1231 Encounter for screening mammogram for malignant neoplasm of breast: Secondary | ICD-10-CM | POA: Diagnosis not present

## 2018-10-15 NOTE — Discharge Instructions (Signed)

## 2018-10-19 ENCOUNTER — Ambulatory Visit: Payer: Medicare Other | Admitting: Anesthesiology

## 2018-10-19 ENCOUNTER — Encounter
Admission: RE | Disposition: A | Discharge status: Home / Self Care | Payer: Self-pay | Source: Ambulatory Visit | Attending: Ophthalmology

## 2018-10-19 ENCOUNTER — Other Ambulatory Visit: Payer: Self-pay | Admitting: Internal Medicine

## 2018-10-19 ENCOUNTER — Ambulatory Visit
Admission: RE | Admit: 2018-10-19 | Discharge: 2018-10-19 | Disposition: A | Discharge status: Home / Self Care | Payer: Medicare Other | Source: Ambulatory Visit | Attending: Ophthalmology | Admitting: Ophthalmology

## 2018-10-19 DIAGNOSIS — E039 Hypothyroidism, unspecified: Secondary | ICD-10-CM | POA: Insufficient documentation

## 2018-10-19 DIAGNOSIS — R928 Other abnormal and inconclusive findings on diagnostic imaging of breast: Secondary | ICD-10-CM

## 2018-10-19 DIAGNOSIS — Z90710 Acquired absence of both cervix and uterus: Secondary | ICD-10-CM | POA: Insufficient documentation

## 2018-10-19 DIAGNOSIS — Z981 Arthrodesis status: Secondary | ICD-10-CM | POA: Diagnosis not present

## 2018-10-19 DIAGNOSIS — R229 Localized swelling, mass and lump, unspecified: Secondary | ICD-10-CM | POA: Insufficient documentation

## 2018-10-19 DIAGNOSIS — K449 Diaphragmatic hernia without obstruction or gangrene: Secondary | ICD-10-CM | POA: Insufficient documentation

## 2018-10-19 DIAGNOSIS — Z91040 Latex allergy status: Secondary | ICD-10-CM | POA: Diagnosis not present

## 2018-10-19 DIAGNOSIS — Z96652 Presence of left artificial knee joint: Secondary | ICD-10-CM | POA: Insufficient documentation

## 2018-10-19 DIAGNOSIS — Z882 Allergy status to sulfonamides status: Secondary | ICD-10-CM | POA: Insufficient documentation

## 2018-10-19 DIAGNOSIS — E7800 Pure hypercholesterolemia, unspecified: Secondary | ICD-10-CM | POA: Insufficient documentation

## 2018-10-19 DIAGNOSIS — I10 Essential (primary) hypertension: Secondary | ICD-10-CM | POA: Diagnosis not present

## 2018-10-19 DIAGNOSIS — H2512 Age-related nuclear cataract, left eye: Secondary | ICD-10-CM | POA: Insufficient documentation

## 2018-10-19 DIAGNOSIS — K219 Gastro-esophageal reflux disease without esophagitis: Secondary | ICD-10-CM | POA: Insufficient documentation

## 2018-10-19 DIAGNOSIS — M1990 Unspecified osteoarthritis, unspecified site: Secondary | ICD-10-CM | POA: Diagnosis not present

## 2018-10-19 DIAGNOSIS — Z885 Allergy status to narcotic agent status: Secondary | ICD-10-CM | POA: Diagnosis not present

## 2018-10-19 DIAGNOSIS — N6489 Other specified disorders of breast: Secondary | ICD-10-CM

## 2018-10-19 DIAGNOSIS — I890 Lymphedema, not elsewhere classified: Secondary | ICD-10-CM | POA: Insufficient documentation

## 2018-10-19 DIAGNOSIS — Z9049 Acquired absence of other specified parts of digestive tract: Secondary | ICD-10-CM | POA: Diagnosis not present

## 2018-10-19 DIAGNOSIS — Z9841 Cataract extraction status, right eye: Secondary | ICD-10-CM | POA: Diagnosis not present

## 2018-10-19 HISTORY — PX: CATARACT EXTRACTION W/PHACO: SHX586

## 2018-10-19 SURGERY — PHACOEMULSIFICATION, CATARACT, WITH IOL INSERTION
Anesthesia: Monitor Anesthesia Care | Site: Eye | Laterality: Left

## 2018-10-19 MED ORDER — LIDOCAINE HCL (PF) 2 % IJ SOLN
INTRAOCULAR | Status: DC | PRN
  Administered 2018-10-19: 1 mL via INTRAOCULAR

## 2018-10-19 MED ORDER — SODIUM HYALURONATE 23 MG/ML IO SOLN
INTRAOCULAR | Status: DC | PRN
  Administered 2018-10-19: 0.6 mL via INTRAOCULAR

## 2018-10-19 MED ORDER — MIDAZOLAM HCL 2 MG/2ML IJ SOLN
INTRAMUSCULAR | Status: DC | PRN
  Administered 2018-10-19: 2 mg via INTRAVENOUS

## 2018-10-19 MED ORDER — SODIUM HYALURONATE 10 MG/ML IO SOLN
INTRAOCULAR | Status: DC | PRN
  Administered 2018-10-19: 0.55 mL via INTRAOCULAR

## 2018-10-19 MED ORDER — TETRACAINE HCL 0.5 % OP SOLN
1.0000 [drp] | OPHTHALMIC | Status: DC | PRN
  Administered 2018-10-19 (×2): 1 [drp] via OPHTHALMIC

## 2018-10-19 MED ORDER — LACTATED RINGERS IV SOLN: 10.0000 mL/h | INTRAVENOUS | Status: DC

## 2018-10-19 MED ORDER — ACETAMINOPHEN 325 MG PO TABS
650.0000 mg | ORAL_TABLET | Freq: Once | ORAL | Status: AC
  Administered 2018-10-19: 650 mg via ORAL

## 2018-10-19 MED ORDER — FENTANYL CITRATE (PF) 100 MCG/2ML IJ SOLN
INTRAMUSCULAR | Status: DC | PRN
  Administered 2018-10-19: 50 ug via INTRAVENOUS

## 2018-10-19 MED ORDER — BRIMONIDINE TARTRATE-TIMOLOL 0.2-0.5 % OP SOLN
OPHTHALMIC | Status: DC | PRN
  Administered 2018-10-19: 1 [drp] via OPHTHALMIC

## 2018-10-19 MED ORDER — EPINEPHRINE PF 1 MG/ML IJ SOLN
INTRAOCULAR | Status: DC | PRN
  Administered 2018-10-19: 104 mL via OPHTHALMIC

## 2018-10-19 MED ORDER — ONDANSETRON HCL 4 MG/2ML IJ SOLN: 4.0000 mg | Freq: Once | INTRAMUSCULAR | Status: DC | PRN

## 2018-10-19 MED ORDER — MOXIFLOXACIN HCL 0.5 % OP SOLN
OPHTHALMIC | Status: DC | PRN
  Administered 2018-10-19: 0.2 mL via OPHTHALMIC

## 2018-10-19 MED ORDER — ARMC OPHTHALMIC DILATING DROPS
1.0000 "application " | OPHTHALMIC | Status: DC | PRN
  Administered 2018-10-19 (×3): 1 via OPHTHALMIC

## 2018-10-19 SURGICAL SUPPLY — 19 items
CANNULA ANT/CHMB 27G (MISCELLANEOUS) ×1 IMPLANT
CANNULA ANT/CHMB 27GA (MISCELLANEOUS) ×2 IMPLANT
DISSECTOR HYDRO NUCLEUS 50X22 (MISCELLANEOUS) ×2 IMPLANT
GLOVE SURG LX 7.5 STRW (GLOVE) ×1
GLOVE SURG LX STRL 7.5 STRW (GLOVE) ×1 IMPLANT
GLOVE SURG SYN 8.5  E (GLOVE) ×1
GLOVE SURG SYN 8.5 E (GLOVE) ×1 IMPLANT
GLOVE SURG SYN 8.5 PF PI (GLOVE) ×1 IMPLANT
GOWN STRL REUS W/ TWL LRG LVL3 (GOWN DISPOSABLE) ×2 IMPLANT
GOWN STRL REUS W/TWL LRG LVL3 (GOWN DISPOSABLE) ×2
LENS IOL TECNIS ITEC 20.0 (Intraocular Lens) ×1 IMPLANT
MARKER SKIN DUAL TIP RULER LAB (MISCELLANEOUS) ×2 IMPLANT
PACK DR. KING ARMS (PACKS) ×2 IMPLANT
PACK EYE AFTER SURG (MISCELLANEOUS) ×2 IMPLANT
PACK OPTHALMIC (MISCELLANEOUS) ×2 IMPLANT
SYR 3ML LL SCALE MARK (SYRINGE) ×2 IMPLANT
SYR TB 1ML LUER SLIP (SYRINGE) ×2 IMPLANT
WATER STERILE IRR 500ML POUR (IV SOLUTION) ×2 IMPLANT
WIPE NON LINTING 3.25X3.25 (MISCELLANEOUS) ×2 IMPLANT

## 2018-10-19 NOTE — Anesthesia Procedure Notes (Signed)
Date/Time: 10/19/2018 12:13 PM Performed by: Maryan RuedWilson, Victoriana Aziz M, CRNA Pre-anesthesia Checklist: Patient identified, Emergency Drugs available, Suction available, Timeout performed and Patient being monitored Patient Re-evaluated:Patient Re-evaluated prior to induction Oxygen Delivery Method: Nasal cannula Placement Confirmation: positive ETCO2

## 2018-10-19 NOTE — Transfer of Care (Signed)
Immediate Anesthesia Transfer of Care Note  Patient: Janet Galvan  Procedure(s) Performed: CATARACT EXTRACTION PHACO AND INTRAOCULAR LENS PLACEMENT (IOC) LEFT (Left Eye)  Patient Location: PACU  Anesthesia Type: MAC  Level of Consciousness: awake, alert  and patient cooperative  Airway and Oxygen Therapy: Patient Spontanous Breathing and Patient connected to supplemental oxygen  Post-op Assessment: Post-op Vital signs reviewed, Patient's Cardiovascular Status Stable, Respiratory Function Stable, Patent Airway and No signs of Nausea or vomiting  Post-op Vital Signs: Reviewed and stable  Complications: No apparent anesthesia complications

## 2018-10-19 NOTE — Anesthesia Postprocedure Evaluation (Signed)
Anesthesia Post Note  Patient: Janet Galvan  Procedure(s) Performed: CATARACT EXTRACTION PHACO AND INTRAOCULAR LENS PLACEMENT (IOC) LEFT (Left Eye)  Patient location during evaluation: PACU Anesthesia Type: MAC Level of consciousness: awake and alert Pain management: pain level controlled Vital Signs Assessment: post-procedure vital signs reviewed and stable Respiratory status: spontaneous breathing, nonlabored ventilation, respiratory function stable and patient connected to nasal cannula oxygen Cardiovascular status: stable and blood pressure returned to baseline Postop Assessment: no apparent nausea or vomiting Anesthetic complications: no    Veda Canning

## 2018-10-19 NOTE — H&P (Signed)
The History and Physical notes are on paper, have been signed, and are to be scanned.   I have examined the patient and there are no changes to the H&P.   Janet BladeBradley Galvan 10/19/2018 12:00 PM

## 2018-10-19 NOTE — Op Note (Signed)
OPERATIVE NOTE  Janet Galvan [id number] 10/19/2018   PREOPERATIVE DIAGNOSIS:  Nuclear sclerotic cataract left eye.  H25.12   POSTOPERATIVE DIAGNOSIS:    Nuclear sclerotic cataract left eye.     PROCEDURE:  Phacoemusification with posterior chamber intraocular lens placement of the left eye   LENS:   Implant Name Type Inv. Item Serial No. Manufacturer Lot No. LRB No. Used  LENS IOL DIOP 20.0 - [id number] Intraocular Lens LENS IOL DIOP 20.0 [id number] AMO  Left 1       PCB00 +20.0   ULTRASOUND TIME: 1 minutes 21 seconds.  CDE 7.50   SURGEON:  Willey BladeBradley King, MD, MPH   ANESTHESIA:  Topical with tetracaine drops augmented with 1% preservative-free intracameral lidocaine.  ESTIMATED BLOOD LOSS: <1 mL   COMPLICATIONS:  None.   DESCRIPTION OF PROCEDURE:  The patient was identified in the holding room and transported to the operating room and placed in the supine position under the operating microscope.  The left eye was identified as the operative eye and it was prepped and draped in the usual sterile ophthalmic fashion.   A 1.0 millimeter clear-corneal paracentesis was made at the 5:00 position. 0.5 ml of preservative-free 1% lidocaine with epinephrine was injected into the anterior chamber.  The anterior chamber was filled with Healon 5 viscoelastic.  A 2.4 millimeter keratome was used to make a near-clear corneal incision at the 2:00 position.  A curvilinear capsulorrhexis was made with a cystotome and capsulorrhexis forceps.  Balanced salt solution was used to hydrodissect and hydrodelineate the nucleus.   Phacoemulsification was then used in stop and chop fashion to remove the lens nucleus and epinucleus.  The remaining cortex was then removed using the irrigation and aspiration handpiece. Healon was then placed into the capsular bag to distend it for lens placement.  A lens was then injected into the capsular bag.  The remaining viscoelastic was aspirated.   Wounds were  hydrated with balanced salt solution.  The anterior chamber was inflated to a physiologic pressure with balanced salt solution.  Intracameral vigamox 0.1 mL undiltued was injected into the eye and a drop placed onto the ocular surface.  No wound leaks were noted.  The patient was taken to the recovery room in stable condition without complications of anesthesia or surgery  Willey BladeBradley King 10/19/2018, 12:45 PM

## 2018-10-19 NOTE — Anesthesia Preprocedure Evaluation (Signed)
Anesthesia Evaluation  Patient identified by MRN, date of birth, ID band Patient awake    Reviewed: Allergy & Precautions, NPO status , Patient's Chart, lab work & pertinent test results  Airway Mallampati: II  TM Distance: >3 FB     Dental   Pulmonary neg pulmonary ROS,    breath sounds clear to auscultation       Cardiovascular hypertension,  Rhythm:Regular Rate:Normal  HLD   Neuro/Psych    GI/Hepatic hiatal hernia, GERD  ,  Endo/Other  Hypothyroidism Morbid obesity  Renal/GU      Musculoskeletal  (+) Arthritis ,   Abdominal   Peds  Hematology   Anesthesia Other Findings   Reproductive/Obstetrics                             Anesthesia Physical  Anesthesia Plan  ASA: III  Anesthesia Plan: MAC   Post-op Pain Management:    Induction: Intravenous  PONV Risk Score and Plan:   Airway Management Planned: Natural Airway and Nasal Cannula  Additional Equipment:   Intra-op Plan:   Post-operative Plan:   Informed Consent: I have reviewed the patients History and Physical, chart, labs and discussed the procedure including the risks, benefits and alternatives for the proposed anesthesia with the patient or authorized representative who has indicated his/her understanding and acceptance.     Plan Discussed with: CRNA  Anesthesia Plan Comments:         Anesthesia Quick Evaluation

## 2018-10-27 ENCOUNTER — Ambulatory Visit
Admission: RE | Admit: 2018-10-27 | Discharge: 2018-10-27 | Disposition: A | Discharge status: Home / Self Care | Payer: Medicare Other | Source: Ambulatory Visit | Attending: Internal Medicine | Admitting: Internal Medicine

## 2018-10-27 DIAGNOSIS — N6489 Other specified disorders of breast: Secondary | ICD-10-CM | POA: Diagnosis present

## 2018-10-27 DIAGNOSIS — R928 Other abnormal and inconclusive findings on diagnostic imaging of breast: Secondary | ICD-10-CM

## 2019-02-24 ENCOUNTER — Other Ambulatory Visit: Payer: Self-pay

## 2019-02-24 ENCOUNTER — Encounter (INDEPENDENT_AMBULATORY_CARE_PROVIDER_SITE_OTHER): Payer: Self-pay

## 2019-02-24 ENCOUNTER — Ambulatory Visit
Admission: RE | Admit: 2019-02-24 | Discharge: 2019-02-24 | Disposition: A | Discharge status: Home / Self Care | Payer: Medicare Other | Source: Ambulatory Visit | Attending: Internal Medicine | Admitting: Internal Medicine

## 2019-02-24 ENCOUNTER — Other Ambulatory Visit: Payer: Self-pay | Admitting: Internal Medicine

## 2019-02-24 DIAGNOSIS — M79605 Pain in left leg: Secondary | ICD-10-CM

## 2019-08-27 DIAGNOSIS — M1711 Unilateral primary osteoarthritis, right knee: Secondary | ICD-10-CM | POA: Insufficient documentation

## 2019-11-02 IMAGING — MG MM DIGITAL DIAGNOSTIC UNILAT*R* W/ TOMO W/ CAD
4 series · 4 of 12 positions shown · non-contrast
Comparison: Screening mammogram October 15, 2018

CLINICAL DATA: 72-year-old patient recalled from recent screening
mammogram for evaluation of a possible asymmetry in the right
breast. In discussion with the patient today, she has had recent
visible bruising in the 9 o'clock region of the right breast, that
has almost nearly resolved.

EXAM:
DIGITAL DIAGNOSTIC RIGHT MAMMOGRAM WITH CAD AND TOMO
ULTRASOUND RIGHT BREAST

[R ML synth-2D]
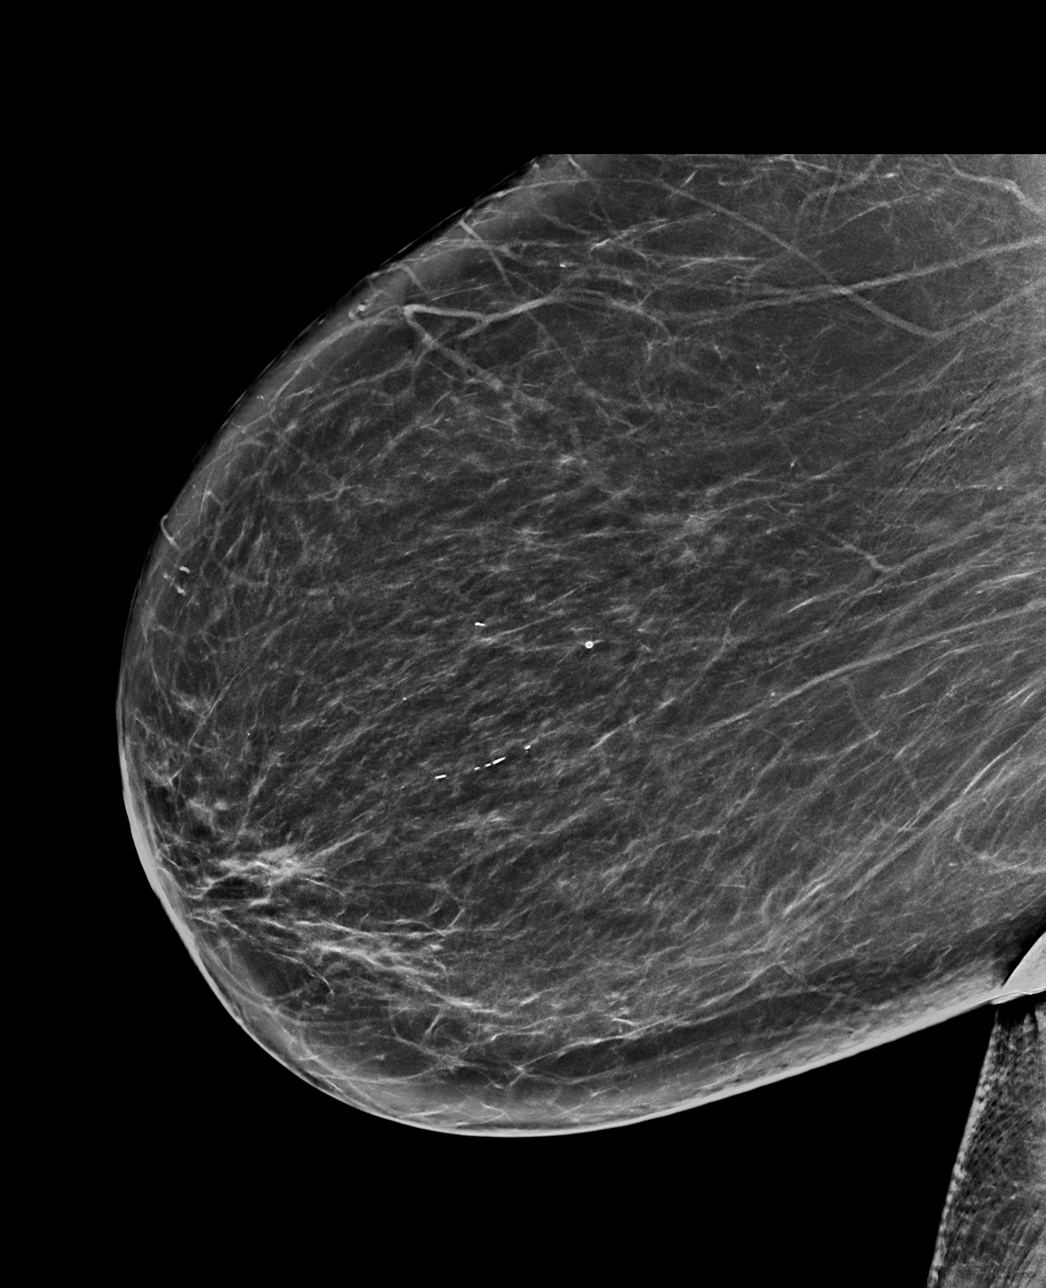

[R CC synth-2D]
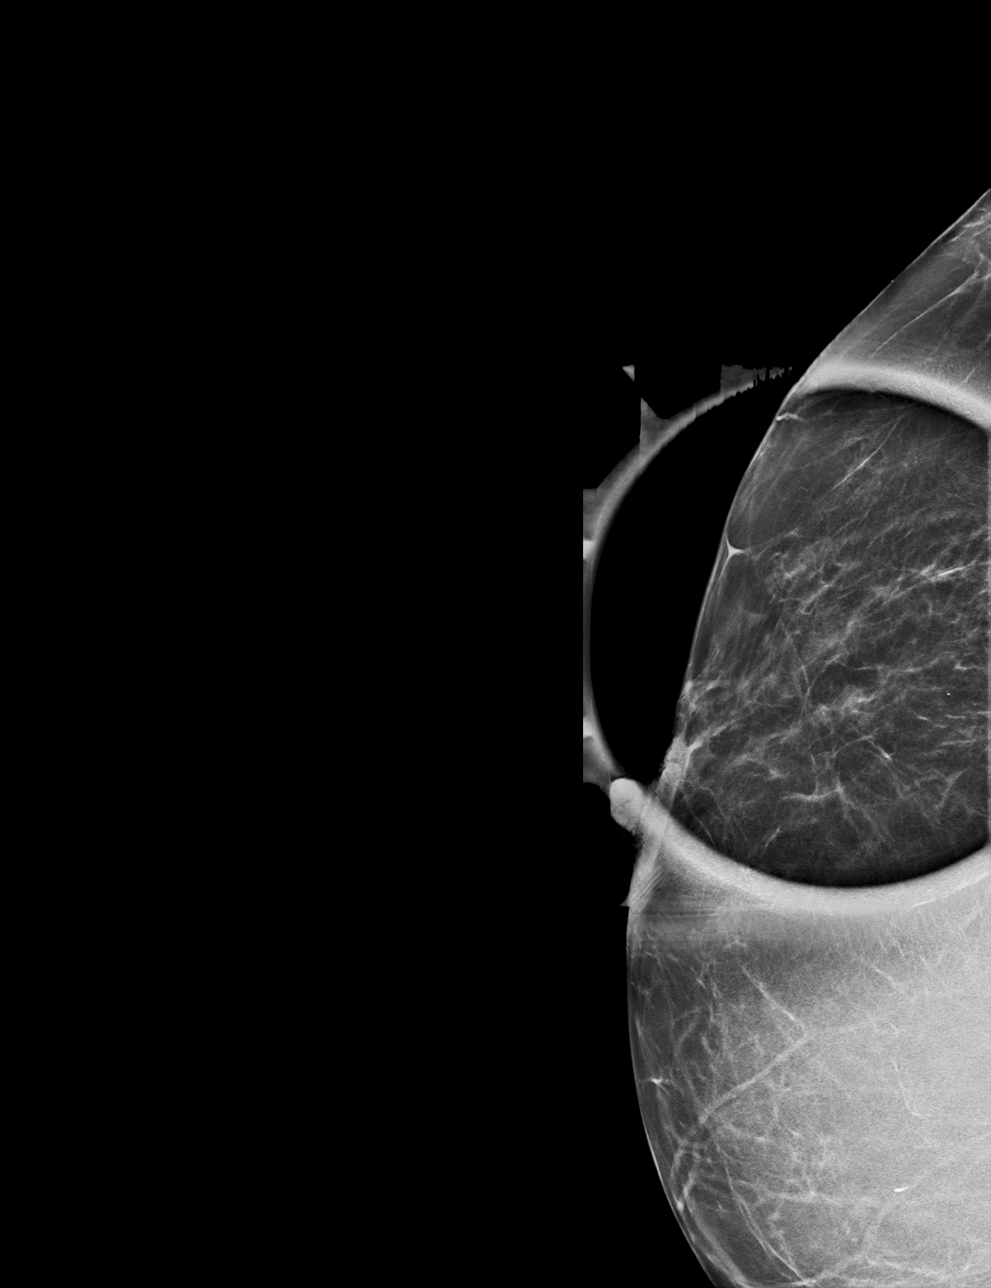

[R ML tomo · tomo slice 41/82.0]
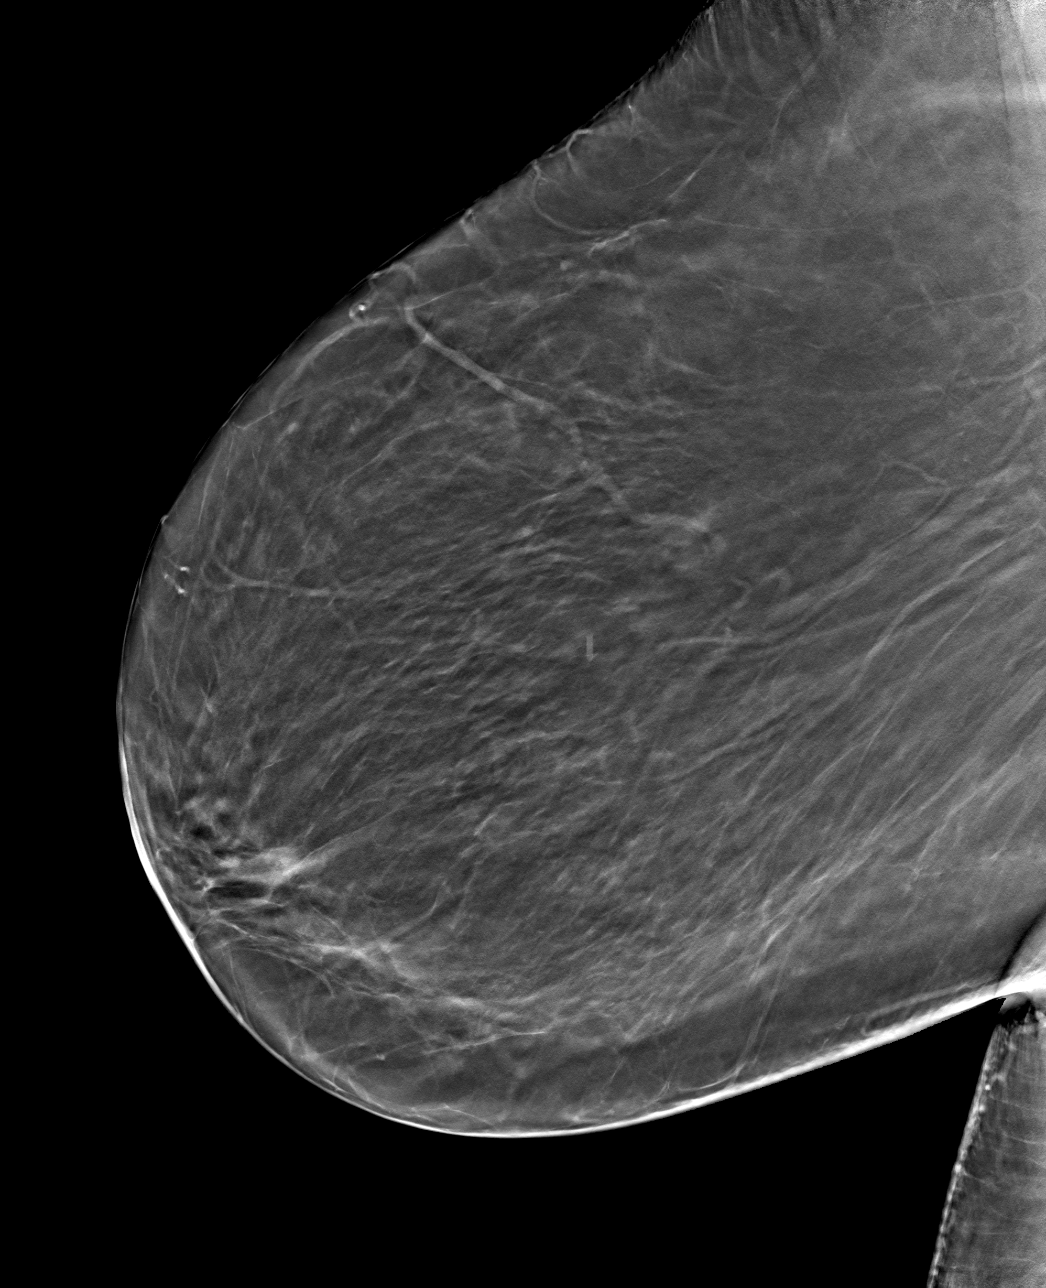

[R CC tomo · tomo slice 25/49.0]
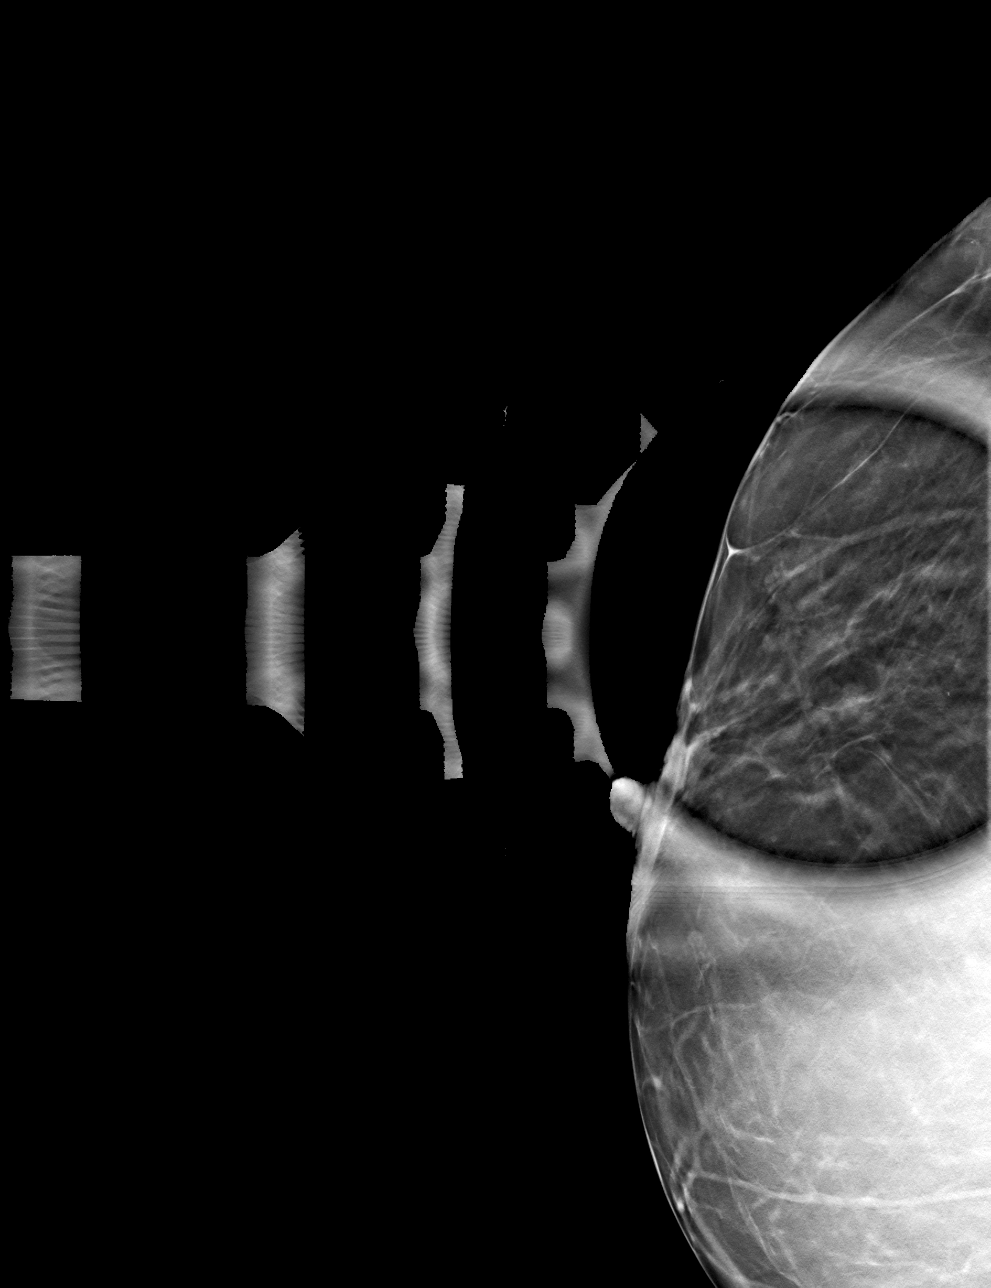

[4 of 12 positions shown; findings below may reference images not displayed]

ACR Breast Density Category b: There are scattered areas of
fibroglandular density.
FINDINGS: Additional views of the right breast confirm a fat density mass in
the superficial aspect of the outer periareolar right breast.

Mammographic images were processed with CAD.

On physical exam, there is a very faint greenish discoloration of
the patient's skin in the 9 o'clock region of the right breast
periareolar consistent with her reported history of a resolving
bruise. I do not palpate a lump.

Targeted ultrasound is performed, showing a hyperechoic
superficially positioned mass within the right breast at 9 o'clock
position 2 cm from the nipple. The mass measures 1.3 x 1.2 x 0.7 cm
and corresponds to the fat density mass seen on the mammogram.
IMPRESSION: Mammogram and ultrasound findings in the 9 o'clock position the
right breast are consistent with benign fat necrosis and correlate
well with the patient's history of recent visible cutaneous bruising
in this region. No evidence of malignancy.

RECOMMENDATION:
Screening mammogram in one year.(Code:LT-T-XYV)

I have discussed the findings and recommendations with the patient.
Results were also provided in writing at the conclusion of the
visit. If applicable, a reminder letter will be sent to the patient
regarding the next appointment.

BI-RADS CATEGORY  2: Benign.

## 2019-11-02 IMAGING — US ULTRASOUND RIGHT BREAST LIMITED
1 series · 10 of 10 positions shown · non-contrast
Comparison: Screening mammogram October 15, 2018

CLINICAL DATA: 72-year-old patient recalled from recent screening
mammogram for evaluation of a possible asymmetry in the right
breast. In discussion with the patient today, she has had recent
visible bruising in the 9 o'clock region of the right breast, that
has almost nearly resolved.

EXAM:
DIGITAL DIAGNOSTIC RIGHT MAMMOGRAM WITH CAD AND TOMO
ULTRASOUND RIGHT BREAST

[Series 1: ultrasound right breast limited · 0.05mm/px · 10 of 10 slices shown]
[im 1/10]
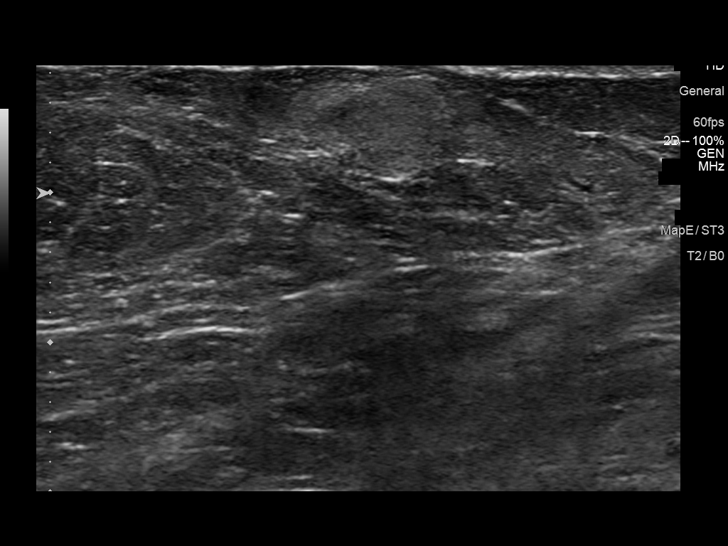
[im 2/10]
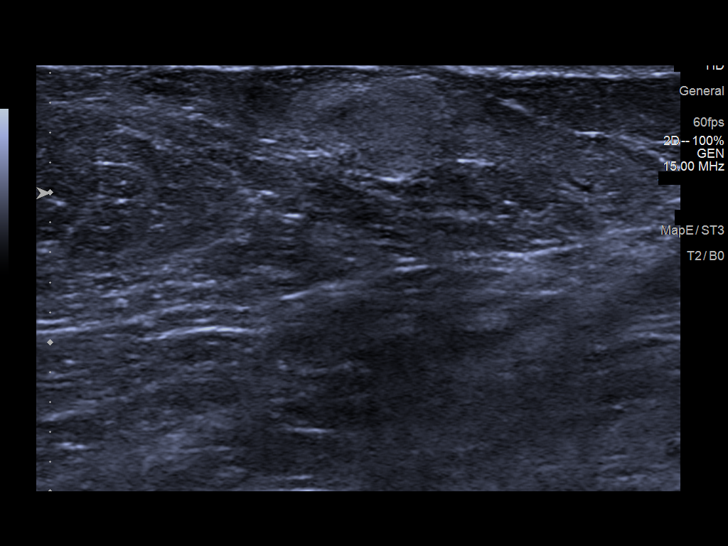
[im 3/10]
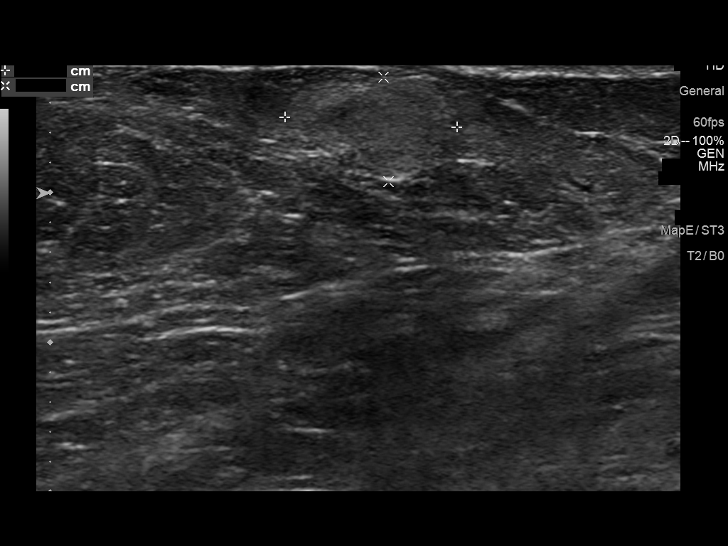
[im 4/10]
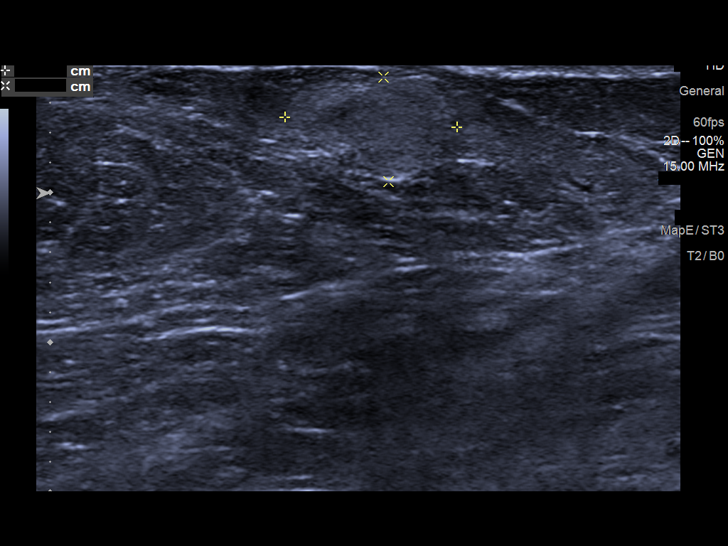
[im 5/10]
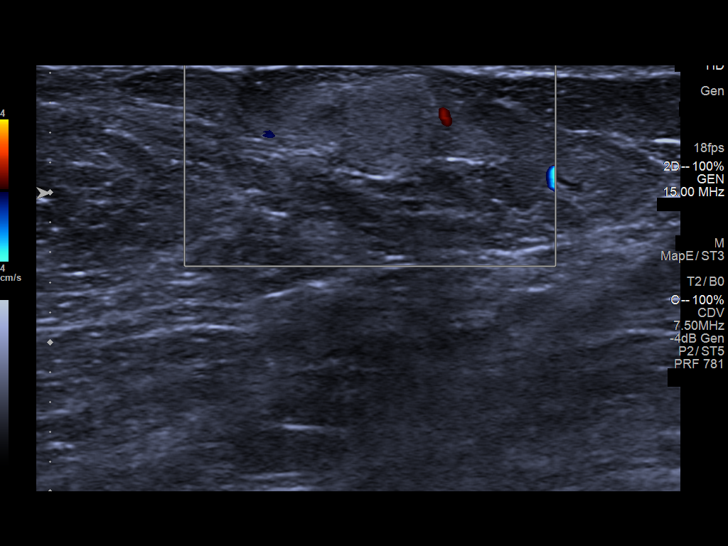
[im 6/10]
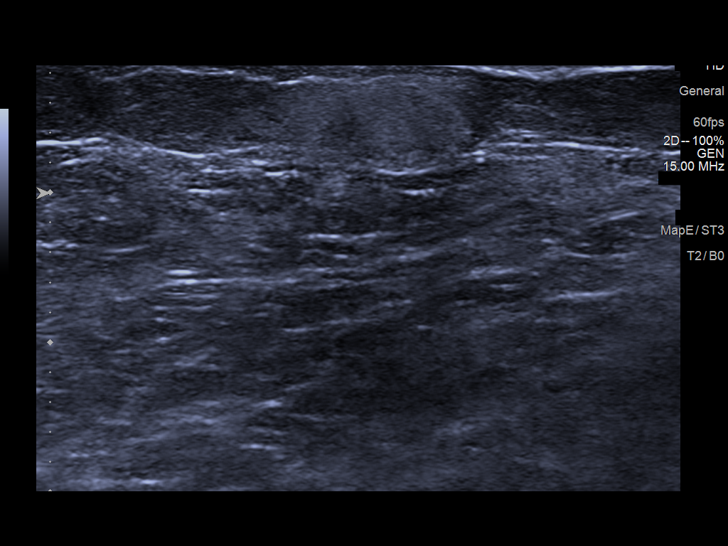
[im 7/10]
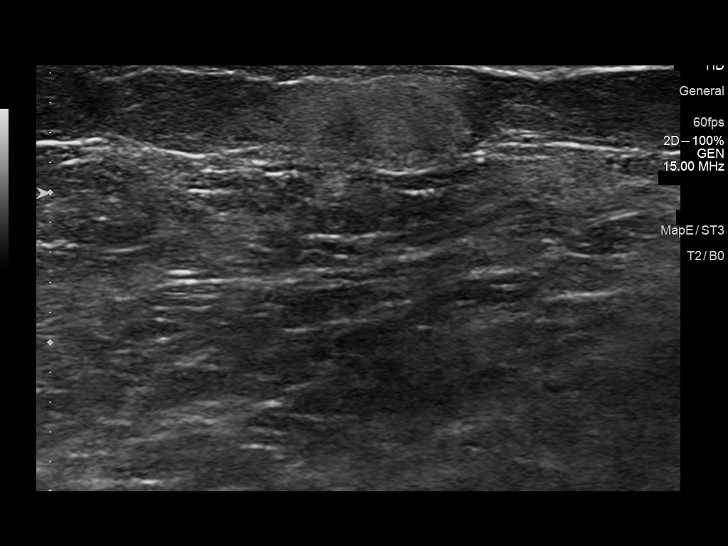
[im 8/10]
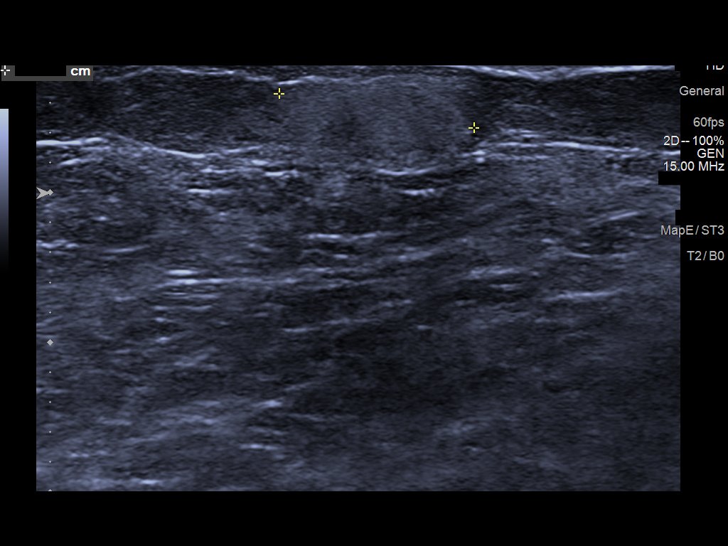
[im 9/10]
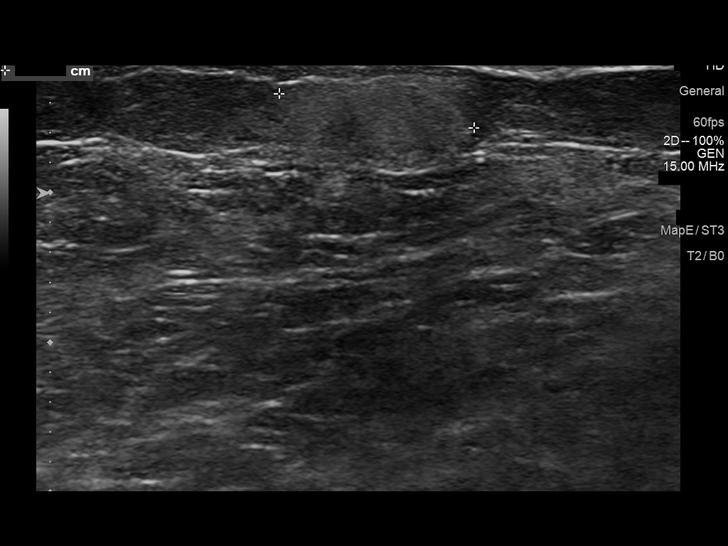
[im 10/10]
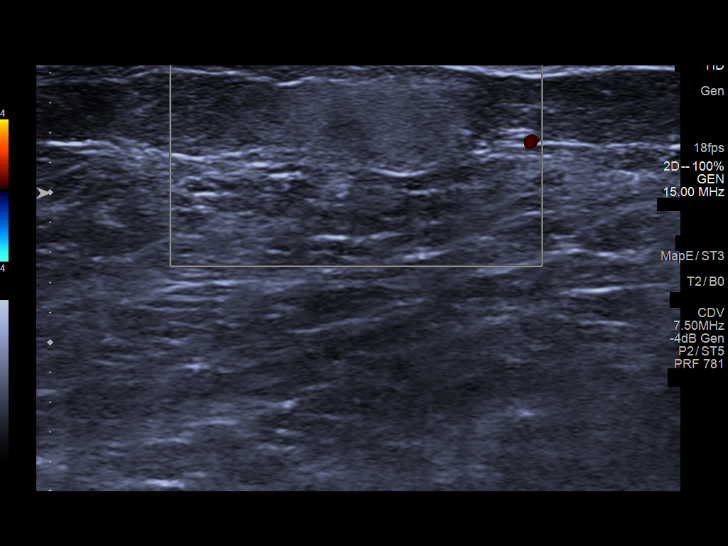

[10 of 10 positions shown; findings below may reference images not displayed]

ACR Breast Density Category b: There are scattered areas of
fibroglandular density.
FINDINGS: Additional views of the right breast confirm a fat density mass in
the superficial aspect of the outer periareolar right breast.

Mammographic images were processed with CAD.

On physical exam, there is a very faint greenish discoloration of
the patient's skin in the 9 o'clock region of the right breast
periareolar consistent with her reported history of a resolving
bruise. I do not palpate a lump.

Targeted ultrasound is performed, showing a hyperechoic
superficially positioned mass within the right breast at 9 o'clock
position 2 cm from the nipple. The mass measures 1.3 x 1.2 x 0.7 cm
and corresponds to the fat density mass seen on the mammogram.
IMPRESSION: Mammogram and ultrasound findings in the 9 o'clock position the
right breast are consistent with benign fat necrosis and correlate
well with the patient's history of recent visible cutaneous bruising
in this region. No evidence of malignancy.

RECOMMENDATION:
Screening mammogram in one year.(Code:LT-T-XYV)

I have discussed the findings and recommendations with the patient.
Results were also provided in writing at the conclusion of the
visit. If applicable, a reminder letter will be sent to the patient
regarding the next appointment.

BI-RADS CATEGORY  2: Benign.

## 2019-11-18 ENCOUNTER — Other Ambulatory Visit: Payer: Self-pay | Admitting: Internal Medicine

## 2019-11-18 DIAGNOSIS — Z1231 Encounter for screening mammogram for malignant neoplasm of breast: Secondary | ICD-10-CM

## 2019-12-08 ENCOUNTER — Other Ambulatory Visit: Payer: Self-pay | Admitting: Neurosurgery

## 2019-12-08 DIAGNOSIS — G8929 Other chronic pain: Secondary | ICD-10-CM

## 2019-12-17 ENCOUNTER — Ambulatory Visit
Admission: RE | Admit: 2019-12-17 | Discharge: 2019-12-17 | Disposition: A | Discharge status: Home / Self Care | Payer: Medicare Other | Source: Ambulatory Visit | Attending: Neurosurgery | Admitting: Neurosurgery

## 2019-12-17 ENCOUNTER — Other Ambulatory Visit: Payer: Self-pay

## 2019-12-17 DIAGNOSIS — M5442 Lumbago with sciatica, left side: Secondary | ICD-10-CM | POA: Diagnosis not present

## 2019-12-17 DIAGNOSIS — G8929 Other chronic pain: Secondary | ICD-10-CM

## 2019-12-17 MED ORDER — GADOBUTROL 1 MMOL/ML IV SOLN
10.0000 mL | Freq: Once | INTRAVENOUS | Status: AC | PRN
  Administered 2019-12-17: 10 mL via INTRAVENOUS

## 2020-01-06 ENCOUNTER — Ambulatory Visit
Admission: RE | Admit: 2020-01-06 | Discharge: 2020-01-06 | Disposition: A | Discharge status: Home / Self Care | Payer: Medicare Other | Source: Ambulatory Visit | Attending: Internal Medicine | Admitting: Internal Medicine

## 2020-01-06 DIAGNOSIS — Z1231 Encounter for screening mammogram for malignant neoplasm of breast: Secondary | ICD-10-CM | POA: Diagnosis present

## 2020-01-22 ENCOUNTER — Ambulatory Visit: Payer: Medicare Other | Attending: Internal Medicine

## 2020-01-22 DIAGNOSIS — Z23 Encounter for immunization: Secondary | ICD-10-CM | POA: Insufficient documentation

## 2020-01-22 NOTE — Progress Notes (Signed)
   Covid-19 Vaccination Clinic  Name:  Janet Galvan    MRN: [id number] DOB: 1946-06-09  01/22/2020  Janet Galvan was observed post Covid-19 immunization for 15 minutes without incidence. She was provided with Vaccine Information Sheet and instruction to access the V-Safe system.   Janet Galvan was instructed to call 911 with any severe reactions post vaccine: Marland Kitchen Difficulty breathing  . Swelling of your face and throat  . A fast heartbeat  . A bad rash all over your body  . Dizziness and weakness    Immunizations Administered    Name Date Dose VIS Date Route   Pfizer COVID-19 Vaccine 01/22/2020 11:00 AM 0.3 mL 11/12/2019 Intramuscular   Manufacturer: [employer], [employer]   Lot: [id number]   NDC: [address]-1

## 2020-01-23 ENCOUNTER — Ambulatory Visit: Payer: Medicare Other

## 2020-02-15 ENCOUNTER — Ambulatory Visit: Payer: Medicare Other | Attending: Internal Medicine

## 2020-02-15 DIAGNOSIS — Z23 Encounter for immunization: Secondary | ICD-10-CM

## 2020-02-15 NOTE — Progress Notes (Signed)
   Covid-19 Vaccination Clinic  Name:  Janet Galvan    MRN: [id number] DOB: 03-30-1946  02/15/2020  [name] was observed post Covid-19 immunization for 15 minutes without incident. She was provided with Vaccine Information Sheet and instruction to access the V-Safe system.   [name] was instructed to call 911 with any severe reactions post vaccine: Marland Kitchen Difficulty breathing  . Swelling of face and throat  . A fast heartbeat  . A bad rash all over body  . Dizziness and weakness   Immunizations Administered    Name Date Dose VIS Date Route   Pfizer COVID-19 Vaccine 02/15/2020 10:56 AM 0.3 mL 11/12/2019 Intramuscular   Manufacturer: [employer], [employer]   Lot: [id number]   NDC: [address]-3

## 2020-05-08 ENCOUNTER — Other Ambulatory Visit: Payer: Self-pay | Admitting: Surgery

## 2020-05-08 DIAGNOSIS — M19012 Primary osteoarthritis, left shoulder: Secondary | ICD-10-CM

## 2020-05-08 DIAGNOSIS — M7582 Other shoulder lesions, left shoulder: Secondary | ICD-10-CM

## 2020-05-19 ENCOUNTER — Other Ambulatory Visit: Payer: Self-pay

## 2020-05-19 ENCOUNTER — Ambulatory Visit
Admission: RE | Admit: 2020-05-19 | Discharge: 2020-05-19 | Disposition: A | Discharge status: Home / Self Care | Payer: Medicare Other | Source: Ambulatory Visit | Attending: Surgery | Admitting: Surgery

## 2020-05-19 DIAGNOSIS — M19012 Primary osteoarthritis, left shoulder: Secondary | ICD-10-CM | POA: Diagnosis present

## 2020-05-19 DIAGNOSIS — M7582 Other shoulder lesions, left shoulder: Secondary | ICD-10-CM

## 2020-09-22 ENCOUNTER — Encounter: Payer: Self-pay | Admitting: Emergency Medicine

## 2020-09-22 ENCOUNTER — Other Ambulatory Visit: Payer: Self-pay

## 2020-09-22 ENCOUNTER — Ambulatory Visit
Admission: EM | Admit: 2020-09-22 | Discharge: 2020-09-22 | Disposition: A | Discharge status: Home / Self Care | Payer: Medicare Other | Attending: Family Medicine | Admitting: Family Medicine

## 2020-09-22 DIAGNOSIS — H6123 Impacted cerumen, bilateral: Secondary | ICD-10-CM | POA: Diagnosis not present

## 2020-09-22 DIAGNOSIS — H938X3 Other specified disorders of ear, bilateral: Secondary | ICD-10-CM

## 2020-09-22 NOTE — ED Provider Notes (Signed)
[facility]   [id number] 09/22/20 Arrival Time: 1257  CC: EAR PAIN  SUBJECTIVE: History from: patient.  Janet Galvan is a 74 y.o. female who presents with of right ear pain for the past day. Reports bilateral ear fullness. Reports that has hx cerumen impaction and has had to have ears irrigated previously. Denies a precipitating event, such as swimming or wearing ear plugs. Has not attempted OTC treatment. Reports similar symptoms in the past. Denies fever, chills, fatigue, sinus pain, rhinorrhea, ear discharge, sore throat, SOB, wheezing, chest pain, nausea, changes in bowel or bladder habits.    ROS: As per HPI.  All other pertinent ROS negative.     Past Medical History:  Diagnosis Date  . Allergic rhinitis   . GERD (gastroesophageal reflux disease)   . History of colon polyps   . History of hiatal hernia   . Hyperlipidemia   . Hypertension   . Hypothyroidism   . Lymphedema of both lower extremities   . Obesity   . Osteoarthritis   . Venous stasis    Past Surgical History:  Procedure Laterality Date  . ABDOMINAL HYSTERECTOMY    . ANKLE ARTHROSCOPY    . BACK SURGERY    . carpal tunnell release     done endoscopic  . CATARACT EXTRACTION W/PHACO Right 09/28/2018   Procedure: CATARACT EXTRACTION PHACO AND INTRAOCULAR LENS PLACEMENT (IOC)  RIGHT;  Surgeon: Nevada Crane, MD;  Location: [facility];  Service: Ophthalmology;  Laterality: Right;  . CATARACT EXTRACTION W/PHACO Left 10/19/2018   Procedure: CATARACT EXTRACTION PHACO AND INTRAOCULAR LENS PLACEMENT (IOC) LEFT;  Surgeon: Nevada Crane, MD;  Location: [facility];  Service: Ophthalmology;  Laterality: Left;  . CHOLECYSTECTOMY    . COLONOSCOPY WITH PROPOFOL N/A 06/02/2018   Procedure: COLONOSCOPY WITH PROPOFOL;  Surgeon: Toledo, Boykin Nearing, MD;  Location: [facility];  Service: Gastroenterology;  Laterality: N/A;  . ESOPHAGOGASTRODUODENOSCOPY (EGD) WITH PROPOFOL N/A 06/02/2018    Procedure: ESOPHAGOGASTRODUODENOSCOPY (EGD) WITH PROPOFOL;  Surgeon: Toledo, Boykin Nearing, MD;  Location: [facility];  Service: Gastroenterology;  Laterality: N/A;  . EYE SURGERY    . JOINT REPLACEMENT    . TUBAL LIGATION     Allergies  Allergen Reactions  . Augmentin [Amoxicillin-Pot Clavulanate] Nausea Only  . Latex Other (See Comments)    Compression stockings cause blisters. Bandaids cause red skin  . Morphine And Related Other (See Comments)    "crazy" - altered mental status  . Orudis [Ketoprofen] Nausea Only  . Oxycodone Nausea Only  . Shrimp [Shellfish Allergy] Rash   No current facility-administered medications on file prior to encounter.   Current Outpatient Medications on File Prior to Encounter  Medication Sig Dispense Refill  . aspirin EC 81 MG tablet Take 81 mg by mouth daily.    . celecoxib (CELEBREX) 200 MG capsule Take 200 mg by mouth 2 (two) times daily.    . diclofenac sodium (VOLTAREN) 1 % GEL Apply 2 g topically 2 times daily at 12 noon and 4 pm.    . DULoxetine (CYMBALTA) 30 MG capsule Take 30 mg by mouth daily.    Marland Kitchen estradiol (ESTRACE) 1 MG tablet Take 1 mg by mouth daily.    . fluticasone (FLONASE) 50 MCG/ACT nasal spray Place into both nostrils daily as needed for allergies or rhinitis.    Marland Kitchen gabapentin (NEURONTIN) 100 MG capsule Take 100 mg by mouth 3 (three) times daily.    Marland Kitchen levocetirizine (XYZAL) 5 MG tablet Take  5 mg by mouth every evening.    Marland Kitchen levothyroxine (SYNTHROID, LEVOTHROID) 125 MCG tablet Take 112 mcg by mouth daily before breakfast. 1 tab 6 days/week. 1/2 tab on Sunday    . nabumetone (RELAFEN) 500 MG tablet Take 500 mg by mouth 2 (two) times daily.    . pantoprazole (PROTONIX) 40 MG tablet Take 40 mg by mouth daily.    . potassium chloride SA (K-DUR,KLOR-CON) 20 MEQ tablet Take 20 mEq by mouth 5 (five) times daily.    . rosuvastatin (CRESTOR) 20 MG tablet Take 20 mg by mouth daily.    Marland Kitchen topiramate (TOPAMAX) 25 MG capsule Take 25 mg by mouth 2  (two) times daily.     Social History   Socioeconomic History  . Marital status: Divorced    Spouse name: Not on file  . Number of children: Not on file  . Years of education: Not on file  . Highest education level: Not on file  Occupational History  . Not on file  Tobacco Use  . Smoking status: Never Smoker  . Smokeless tobacco: Never Used  Vaping Use  . Vaping Use: Never used  Substance and Sexual Activity  . Alcohol use: Not Currently    Alcohol/week: 0.0 standard drinks    Comment: rarely  . Drug use: Never  . Sexual activity: Not on file  Other Topics Concern  . Not on file  Social History Narrative  . Not on file   Social Determinants of Health   Financial Resource Strain:   . Difficulty of Paying Living Expenses: Not on file  Food Insecurity:   . Worried About Programme researcher, broadcasting/film/video in the Last Year: Not on file  . Ran Out of Food in the Last Year: Not on file  Transportation Needs:   . Lack of Transportation (Medical): Not on file  . Lack of Transportation (Non-Medical): Not on file  Physical Activity:   . Days of Exercise per Week: Not on file  . Minutes of Exercise per Session: Not on file  Stress:   . Feeling of Stress : Not on file  Social Connections:   . Frequency of Communication with Friends and Family: Not on file  . Frequency of Social Gatherings with Friends and Family: Not on file  . Attends Religious Services: Not on file  . Active Member of Clubs or Organizations: Not on file  . Attends Banker Meetings: Not on file  . Marital Status: Not on file  Intimate Partner Violence:   . Fear of Current or Ex-Partner: Not on file  . Emotionally Abused: Not on file  . Physically Abused: Not on file  . Sexually Abused: Not on file   Family History  Problem Relation Age of Onset  . Breast cancer Maternal Aunt 48    OBJECTIVE:  Vitals:   09/22/20 1431 09/22/20 1433  BP:  109/71  Pulse:  78  Resp:  16  Temp:  98.8 F (37.1 C)    TempSrc:  Oral  SpO2:  95%  Weight: 222 lb 0.1 oz (100.7 kg)   Height: 5' (1.524 m)      General appearance: alert; appears fatigued HEENT: Ears: bilateral EACs with cerumen impaction, TMs pearly gray with visible cone of light, without erythema; Eyes: PERRL, EOMI grossly; Sinuses nontender to palpation; Nose: clear rhinorrhea; Throat: oropharynx mildly erythematous, tonsils 1+ without white tonsillar exudates, uvula midline Neck: supple without LAD Lungs: unlabored respirations, symmetrical air entry; cough: absent; no respiratory  distress Heart: regular rate and rhythm.  Radial pulses 2+ symmetrical bilaterally Skin: warm and dry Psychological: alert and cooperative; normal mood and affect  Imaging: No results found.   ASSESSMENT & PLAN:  1. Ear fullness, bilateral   2. Bilateral impacted cerumen    Rest and drink plenty of fluids Cerumen impaction  You may use a solution of half water, half peroxide to clean your ears 2-3 times per week. Continue to use OTC ibuprofen and/ or tylenol as needed for pain control Follow up with PCP if symptoms persists Return here or go to the ER if you have any new or worsening symptoms   Reviewed expectations re: course of current medical issues. Questions answered. Outlined signs and symptoms indicating need for more acute intervention. Patient verbalized understanding. After Visit Summary given.         Moshe Cipro, NP 09/22/20 1500

## 2020-09-22 NOTE — ED Triage Notes (Signed)
Patient c/o ear fullness that began this morning .  Patient stated she has difficulty bilaterally w/ more difficulty in LFT ear.   Patient denies this happening in the past.   Patient has no hearing deficits per triage questioning.

## 2020-09-22 NOTE — Discharge Instructions (Addendum)
You may use a solution of half water, half peroxide to clean your ears 2-3 times per week.  This should help prevent the wax from sticking to your ear canal  Follow up with this office or with primary care if symptoms are persisting.  Follow up in the ER for high fever, trouble swallowing, trouble breathing, other concerning symptoms.

## 2020-11-04 ENCOUNTER — Encounter: Payer: Self-pay | Admitting: Emergency Medicine

## 2020-11-04 ENCOUNTER — Other Ambulatory Visit: Payer: Self-pay

## 2020-11-04 DIAGNOSIS — Z7982 Long term (current) use of aspirin: Secondary | ICD-10-CM | POA: Insufficient documentation

## 2020-11-04 DIAGNOSIS — Z79899 Other long term (current) drug therapy: Secondary | ICD-10-CM | POA: Insufficient documentation

## 2020-11-04 DIAGNOSIS — I10 Essential (primary) hypertension: Secondary | ICD-10-CM | POA: Insufficient documentation

## 2020-11-04 DIAGNOSIS — R300 Dysuria: Secondary | ICD-10-CM | POA: Insufficient documentation

## 2020-11-04 DIAGNOSIS — E039 Hypothyroidism, unspecified: Secondary | ICD-10-CM | POA: Insufficient documentation

## 2020-11-04 DIAGNOSIS — Z96649 Presence of unspecified artificial hip joint: Secondary | ICD-10-CM | POA: Insufficient documentation

## 2020-11-04 DIAGNOSIS — N12 Tubulo-interstitial nephritis, not specified as acute or chronic: Secondary | ICD-10-CM | POA: Diagnosis not present

## 2020-11-04 DIAGNOSIS — N10 Acute tubulo-interstitial nephritis: Secondary | ICD-10-CM | POA: Insufficient documentation

## 2020-11-04 LAB — URINALYSIS, COMPLETE (UACMP) WITH MICROSCOPIC
Bilirubin Urine: NEGATIVE
Glucose, UA: NEGATIVE mg/dL
Ketones, ur: NEGATIVE mg/dL
Nitrite: POSITIVE — AB
Protein, ur: 100 mg/dL — AB
Specific Gravity, Urine: 1.015 (ref 1.005–1.030)
WBC, UA: >50 WBC/hpf — ABNORMAL HIGH (ref 0–5)
pH: 5 (ref 5.0–8.0)

## 2020-11-04 LAB — BASIC METABOLIC PANEL
Anion gap: 7 (ref 5–15)
BUN: 17 mg/dL (ref 8–23)
CO2: 26 mmol/L (ref 22–32)
Calcium: 8.6 mg/dL — ABNORMAL LOW (ref 8.9–10.3)
Chloride: 104 mmol/L (ref 98–111)
Creatinine, Ser: 1.01 mg/dL — ABNORMAL HIGH (ref 0.44–1.00)
GFR, Estimated: 59 mL/min — ABNORMAL LOW (ref >60)
Glucose, Bld: 108 mg/dL — ABNORMAL HIGH (ref 70–99)
Potassium: 3.9 mmol/L (ref 3.5–5.1)
Sodium: 137 mmol/L (ref 135–145)

## 2020-11-04 LAB — CBC
HCT: 34.8 % — ABNORMAL LOW (ref 36.0–46.0)
Hemoglobin: 11.6 g/dL — ABNORMAL LOW (ref 12.0–15.0)
MCH: 30.3 pg (ref 26.0–34.0)
MCHC: 33.3 g/dL (ref 30.0–36.0)
MCV: 90.9 fL (ref 80.0–100.0)
Platelets: 117 10*3/uL — ABNORMAL LOW (ref 150–400)
RBC: 3.83 MIL/uL — ABNORMAL LOW (ref 3.87–5.11)
RDW: 13.6 % (ref 11.5–15.5)
WBC: 10.2 10*3/uL (ref 4.0–10.5)
nRBC: 0 % (ref 0.0–0.2)

## 2020-11-04 NOTE — ED Triage Notes (Signed)
Pt arrived via [facility] from home with reports of bilateral low back pain, pt states the pain started yesterday, but worsened today.  Pt states she does therapy for shoulder pain and states the therapy irritates her back-pt states she has rod in back.  Pt states about 3 weeks she had UTI and was treated for it. Pt denies any burning or painful urination. Pt does endorse urinary frequency and urgency.  Pt states the urine was dark yellow and cloudy and denies any foul odor.

## 2020-11-04 NOTE — ED Notes (Signed)
To ED via EMS from home for lower back pain, onset last night. OTC meds not helpful. No falls or recent injuries. Just getting over a UTI and is unsure if this is related or not. VS BP 138/78, 94% RA, HR 100, T 98.9

## 2020-11-05 ENCOUNTER — Emergency Department: Payer: Medicare Other

## 2020-11-05 ENCOUNTER — Emergency Department
Admission: EM | Admit: 2020-11-05 | Discharge: 2020-11-05 | Disposition: A | Discharge status: Home / Self Care | Payer: Medicare Other | Source: Home / Self Care | Attending: Emergency Medicine | Admitting: Emergency Medicine

## 2020-11-05 DIAGNOSIS — N12 Tubulo-interstitial nephritis, not specified as acute or chronic: Secondary | ICD-10-CM

## 2020-11-05 LAB — BLOOD CULTURE ID PANEL (REFLEXED) - BCID2

## 2020-11-05 LAB — LACTIC ACID, PLASMA: Lactic Acid, Venous: 1.3 mmol/L (ref 0.5–1.9)

## 2020-11-05 MED ORDER — LACTATED RINGERS IV BOLUS
1000.0000 mL | Freq: Once | INTRAVENOUS | Status: AC
  Administered 2020-11-05: 1000 mL via INTRAVENOUS

## 2020-11-05 MED ORDER — ACETAMINOPHEN 500 MG PO TABS
1000.0000 mg | ORAL_TABLET | Freq: Once | ORAL | Status: AC
  Administered 2020-11-05: 1000 mg via ORAL
  Filled 2020-11-05: qty 2

## 2020-11-05 MED ORDER — CEPHALEXIN 500 MG PO CAPS: 500.0000 mg | ORAL_CAPSULE | Freq: Three times a day (TID) | ORAL | 0 refills | Status: DC

## 2020-11-05 MED ORDER — SODIUM CHLORIDE 0.9 % IV SOLN
1.0000 g | Freq: Once | INTRAVENOUS | Status: AC
  Administered 2020-11-05: 1 g via INTRAVENOUS
  Filled 2020-11-05: qty 10

## 2020-11-05 NOTE — ED Provider Notes (Signed)
[facility] Emergency Department Provider Note  ____________________________________________  Time seen: Approximately 5:38 AM  I have reviewed the triage vital signs and the nursing notes.   HISTORY  Chief Complaint Flank Pain   HPI Janet Galvan is a 74 y.o. female with a history of UTIs, GERD, hypertension, hyperlipidemia, hypothyroidism, osteoarthritis who presents for evaluation of flank pain.  Patient reports bilateral dull flank pain that started yesterday and got progressively worse.  Associated with urinary frequency, dysuria, and foul-smelling urine.   No nausea or vomiting, no fever chills, no chest pain or shortness of breath, no abdominal pain.  Patient reports being treated for a UTI 3 weeks ago by her primary care doctor.  Past Medical History:  Diagnosis Date  . Allergic rhinitis   . GERD (gastroesophageal reflux disease)   . History of colon polyps   . History of hiatal hernia   . Hyperlipidemia   . Hypertension   . Hypothyroidism   . Lymphedema of both lower extremities   . Obesity   . Osteoarthritis   . Venous stasis     There are no problems to display for this patient.   Past Surgical History:  Procedure Laterality Date  . ABDOMINAL HYSTERECTOMY    . ANKLE ARTHROSCOPY    . BACK SURGERY    . carpal tunnell release     done endoscopic  . CATARACT EXTRACTION W/PHACO Right 09/28/2018   Procedure: CATARACT EXTRACTION PHACO AND INTRAOCULAR LENS PLACEMENT (IOC)  RIGHT;  Surgeon: [place] CraneKing, Bradley Mark, MD;  [facility];  Service: Ophthalmology;  Laterality: Right;  . CATARACT EXTRACTION W/PHACO Left 10/19/2018   Procedure: CATARACT EXTRACTION PHACO AND INTRAOCULAR LENS PLACEMENT ([facility]) LEFT;  Surgeon: [place] CraneKing, Bradley Mark, MD;  [facility];  Service: Ophthalmology;  Laterality: Left;  . CHOLECYSTECTOMY    . COLONOSCOPY WITH PROPOFOL N/A 06/02/2018   Procedure: COLONOSCOPY WITH PROPOFOL;  Surgeon:  Toledo, Boykin Nearingeodoro K, MD;  Location: [facility] ENDOSCOPY;  Service: Gastroenterology;  Laterality: N/A;  . ESOPHAGOGASTRODUODENOSCOPY (EGD) WITH PROPOFOL N/A 06/02/2018   Procedure: ESOPHAGOGASTRODUODENOSCOPY (EGD) WITH PROPOFOL;  Surgeon: Toledo, Boykin Nearingeodoro K, MD;  Location: [facility] ENDOSCOPY;  Service: Gastroenterology;  Laterality: N/A;  . EYE SURGERY    . JOINT REPLACEMENT    . TUBAL LIGATION      Prior to Admission medications   Medication Sig Start Date End Date Taking? Authorizing Provider  aspirin EC 81 MG tablet Take 81 mg by mouth daily.    [provider]  celecoxib (CELEBREX) 200 MG capsule Take 200 mg by mouth 2 (two) times daily.    [provider]  cephALEXin (KEFLEX) 500 MG capsule Take 1 capsule (500 mg total) by mouth 3 (three) times daily for 7 days. 11/05/20 11/12/20  Nita SickleVeronese, Eads, MD  diclofenac sodium (VOLTAREN) 1 % GEL Apply 2 g topically 2 times daily at 12 noon and 4 pm.    [provider]  DULoxetine (CYMBALTA) 30 MG capsule Take 30 mg by mouth daily.    [provider]  estradiol (ESTRACE) 1 MG tablet Take 1 mg by mouth daily.    [provider]  fluticasone (FLONASE) 50 MCG/ACT nasal spray Place into both nostrils daily as needed for allergies or rhinitis.    [provider]  gabapentin (NEURONTIN) 100 MG capsule Take 100 mg by mouth 3 (three) times daily.    [provider]  levocetirizine (XYZAL) 5 MG tablet Take 5 mg by mouth every evening.  [provider]  levothyroxine (SYNTHROID, LEVOTHROID) 125 MCG tablet Take 112 mcg by mouth daily before breakfast. 1 tab 6 days/week. 1/2 tab on Sunday    [provider]  nabumetone (RELAFEN) 500 MG tablet Take 500 mg by mouth 2 (two) times daily.    [provider]  pantoprazole (PROTONIX) 40 MG tablet Take 40 mg by mouth daily.    [provider]  potassium chloride SA (K-DUR,KLOR-CON) 20 MEQ tablet Take 20 mEq by mouth 5 (five) times  daily.    [provider]  rosuvastatin (CRESTOR) 20 MG tablet Take 20 mg by mouth daily.    [provider]  topiramate (TOPAMAX) 25 MG capsule Take 25 mg by mouth 2 (two) times daily.    [provider]    Allergies Latex, Morphine, Ciprofloxacin, Oxycodone, Ketoprofen, Morphine and related, Amoxicillin-pot clavulanate, and Shrimp [shellfish allergy]  Family History  Problem Relation Age of Onset  . Breast cancer Maternal Aunt 17    Social History Social History   Tobacco Use  . Smoking status: Never Smoker  . Smokeless tobacco: Never Used  Vaping Use  . Vaping Use: Never used  Substance Use Topics  . Alcohol use: Not Currently    Alcohol/week: 0.0 standard drinks    Comment: rarely  . Drug use: Never    Review of Systems  Constitutional: Negative for fever. Eyes: Negative for visual changes. ENT: Negative for sore throat. Neck: No neck pain  Cardiovascular: Negative for chest pain. Respiratory: Negative for shortness of breath. Gastrointestinal: Negative for abdominal pain, vomiting or diarrhea. Genitourinary: + dysuria, frequency, and bilateral flank pain Musculoskeletal: Negative for back pain. Skin: Negative for rash. Neurological: Negative for headaches, weakness or numbness. Psych: No SI or HI  ____________________________________________   PHYSICAL EXAM:  VITAL SIGNS: ED Triage Vitals  Enc Vitals Group     BP 11/04/20 1808 135/64     Pulse Rate 11/04/20 1808 100     Resp 11/04/20 1808 20     Temp 11/04/20 1808 99.7 F (37.6 C)     Temp Source 11/04/20 1808 Oral     SpO2 11/04/20 1808 98 %     Weight 11/04/20 1809 219 lb (99.3 kg)     Height 11/04/20 1809 5' (1.524 m)     Head Circumference --      Peak Flow --      Pain Score 11/04/20 1821 7     Pain Loc --      Pain Edu? --      Excl. in GC? --     Constitutional: Alert and oriented. Well appearing and in no apparent distress. HEENT:      Head: Normocephalic  and atraumatic.         Eyes: Conjunctivae are normal. Sclera is non-icteric.       Mouth/Throat: Mucous membranes are moist.       Neck: Supple with no signs of meningismus. Cardiovascular: Regular rate and rhythm. No murmurs, gallops, or rubs.  Respiratory: Normal respiratory effort. Lungs are clear to auscultation bilaterally. No wheezes, crackles, or rhonchi.  Gastrointestinal: Soft, mild suprapubic tenderness, and non distended with positive bowel sounds. No rebound or guarding. Genitourinary: bilateral CVA tenderness. Musculoskeletal:  No edema, cyanosis, or erythema of extremities. Neurologic: Normal speech and language. Face is symmetric. Moving all extremities. No gross focal neurologic deficits are appreciated. Skin: Skin is warm, dry and intact. No rash noted. Psychiatric: Mood and affect are normal. Speech and behavior are  normal.  ____________________________________________   LABS (all labs ordered are listed, but only abnormal results are displayed)  Labs Reviewed  URINALYSIS, COMPLETE (UACMP) WITH MICROSCOPIC - Abnormal; Notable for the following components:      Result Value   Color, Urine YELLOW (*)    APPearance TURBID (*)    Hgb urine dipstick MODERATE (*)    Protein, ur 100 (*)    Nitrite POSITIVE (*)    Leukocytes,Ua LARGE (*)    WBC, UA >50 (*)    Bacteria, UA FEW (*)    All other components within normal limits  CBC - Abnormal; Notable for the following components:   RBC 3.83 (*)    Hemoglobin 11.6 (*)    HCT 34.8 (*)    Platelets 117 (*)    All other components within normal limits  BASIC METABOLIC PANEL - Abnormal; Notable for the following components:   Glucose, Bld 108 (*)    Creatinine, Ser 1.01 (*)    Calcium 8.6 (*)    GFR, Estimated 59 (*)    All other components within normal limits  CULTURE, BLOOD (SINGLE)  URINE CULTURE  LACTIC ACID, PLASMA   ____________________________________________  EKG  none   ____________________________________________  RADIOLOGY  I have personally reviewed the images performed during this visit and I agree with the Radiologist's read.   Interpretation by Radiologist:  CT Renal Stone Study  Result Date: 11/05/2020 CLINICAL DATA:  Flank pain EXAM: CT ABDOMEN AND PELVIS WITHOUT CONTRAST TECHNIQUE: Multidetector CT imaging of the abdomen and pelvis was performed following the standard protocol without IV contrast. COMPARISON:  None. FINDINGS: LOWER CHEST: Normal. HEPATOBILIARY: Normal hepatic contours. No intra- or extrahepatic biliary dilatation. Status post cholecystectomy. PANCREAS: Normal pancreas. No ductal dilatation or peripancreatic fluid collection. SPLEEN: Normal. ADRENALS/URINARY TRACT: The adrenal glands are normal. There is mild right hydronephrosis. There is periureteral stranding and mild hydroureter. No nephroureterolithiasis. The left kidney is normal. The urinary bladder is normal for degree of distention STOMACH/BOWEL: There is no hiatal hernia. Normal duodenal course and caliber. No small bowel dilatation or inflammation. No focal colonic abnormality. Normal appendix. VASCULAR/LYMPHATIC: Normal course and caliber of the major abdominal vessels. No abdominal or pelvic lymphadenopathy. REPRODUCTIVE: Status post hysterectomy. No adnexal mass. MUSCULOSKELETAL. No bony spinal canal stenosis or focal osseous abnormality. OTHER: None. IMPRESSION: Mild right hydronephrosis and mild hydroureter with periureteral stranding. No nephroureterolithiasis. This may indicate ascending urinary tract infection. Electronically Signed   By: Deatra Robinson M.D.   On: 11/05/2020 04:23     ____________________________________________   PROCEDURES  Procedure(s) performed: None Procedures Critical Care performed:  None ____________________________________________   INITIAL IMPRESSION / ASSESSMENT AND PLAN / ED COURSE   74 y.o. female with a history of UTIs, GERD,  hypertension, hyperlipidemia, hypothyroidism, osteoarthritis who presents for evaluation of bilateral dull flank pain, dysuria, frequency x 2 days.  Patient is well-appearing in no distress, afebrile with no tachycardia, no leukocytosis with no signs of sepsis.  Mild suprapubic and bilateral flank tenderness.  Differential diagnoses including pyelonephritis versus UTI versus kidney stone versus diverticulitis.  UA positive for UTI.  Review of epic shows the patient has grown Klebsiella sensitive to ceftriaxone and Keflex in the past.  No signs of sepsis.  Cultures have been sent.  CT renal was done to rule out obstructing stone.  CT visualized by me with no signs of stone, confirmed by radiology.  CT does show signs of pyelonephritis.  Lactic negative.  Discussed close follow-up with PCP  and my standard return precautions.      _____________________________________________ Please note:  Patient was evaluated in Emergency Department today for the symptoms described in the history of present illness. Patient was evaluated in the context of the global COVID-19 pandemic, which necessitated consideration that the patient might be at risk for infection with the SARS-CoV-2 virus that causes COVID-19. Institutional protocols and algorithms that pertain to the evaluation of patients at risk for COVID-19 are in a state of rapid change based on information released by regulatory bodies including the CDC and federal and state organizations. These policies and algorithms were followed during the patient's care in the ED.  Some ED evaluations and interventions may be delayed as a result of limited staffing during the pandemic.   [place] Controlled Substance Database was reviewed by me. ____________________________________________   FINAL CLINICAL IMPRESSION(S) / ED DIAGNOSES   Final diagnoses:  Pyelonephritis      NEW MEDICATIONS STARTED DURING THIS VISIT:  ED Discharge Orders         Ordered     cephALEXin (KEFLEX) 500 MG capsule  3 times daily        11/05/20 0549           Note:  This document was prepared using Dragon voice recognition software and may include unintentional dictation errors.    Nita Sickle, MD 11/05/20 [phone]

## 2020-11-05 NOTE — Progress Notes (Signed)
PHARMACY - PHYSICIAN COMMUNICATION CRITICAL VALUE ALERT - BLOOD CULTURE IDENTIFICATION (BCID)  Janet Galvan is an 74 y.o. female who presented to [facility] on 11/05/2020 with a chief complaint of flank pain.  Assessment: Bcx GNR; BCID positive for E coli in 1/2 bottles (anaerobic)  Name of physician (or Provider) Contacted: Dr. Derrill Kay  Patient was discharged earlier today (12/5). Spoke with ED provider who will contact patient about results and advise to come back in.   Results for orders placed or performed during the hospital encounter of 11/05/20  Blood Culture ID Panel (Reflexed) (Collected: 11/05/2020  4:59 AM)  Result Value Ref Range   Enterococcus faecalis NOT DETECTED NOT DETECTED   Enterococcus Faecium NOT DETECTED NOT DETECTED   Listeria monocytogenes NOT DETECTED NOT DETECTED   Staphylococcus species NOT DETECTED NOT DETECTED   Staphylococcus aureus (BCID) NOT DETECTED NOT DETECTED   Staphylococcus epidermidis NOT DETECTED NOT DETECTED   Staphylococcus lugdunensis NOT DETECTED NOT DETECTED   Streptococcus species NOT DETECTED NOT DETECTED   Streptococcus agalactiae NOT DETECTED NOT DETECTED   Streptococcus pneumoniae NOT DETECTED NOT DETECTED   Streptococcus pyogenes NOT DETECTED NOT DETECTED   A.calcoaceticus-baumannii NOT DETECTED NOT DETECTED   Bacteroides fragilis NOT DETECTED NOT DETECTED   Enterobacterales DETECTED (A) NOT DETECTED   Enterobacter cloacae complex NOT DETECTED NOT DETECTED   Escherichia coli DETECTED (A) NOT DETECTED   Klebsiella aerogenes NOT DETECTED NOT DETECTED   Klebsiella oxytoca NOT DETECTED NOT DETECTED   Klebsiella pneumoniae NOT DETECTED NOT DETECTED   Proteus species NOT DETECTED NOT DETECTED   Salmonella species NOT DETECTED NOT DETECTED   Serratia marcescens NOT DETECTED NOT DETECTED   Haemophilus influenzae NOT DETECTED NOT DETECTED   Neisseria meningitidis NOT DETECTED NOT DETECTED   Pseudomonas aeruginosa NOT DETECTED NOT  DETECTED   Stenotrophomonas maltophilia NOT DETECTED NOT DETECTED   Candida albicans NOT DETECTED NOT DETECTED   Candida auris NOT DETECTED NOT DETECTED   Candida glabrata NOT DETECTED NOT DETECTED   Candida krusei NOT DETECTED NOT DETECTED   Candida parapsilosis NOT DETECTED NOT DETECTED   Candida tropicalis NOT DETECTED NOT DETECTED   Cryptococcus neoformans/gattii NOT DETECTED NOT DETECTED   CTX-M ESBL NOT DETECTED NOT DETECTED   Carbapenem resistance IMP NOT DETECTED NOT DETECTED   Carbapenem resistance KPC NOT DETECTED NOT DETECTED   Carbapenem resistance NDM NOT DETECTED NOT DETECTED   Carbapenem resist OXA 48 LIKE NOT DETECTED NOT DETECTED   Carbapenem resistance VIM NOT DETECTED NOT DETECTED    Raiford Noble, PharmD Pharmacy Resident  11/05/2020 5:58 PM

## 2020-11-05 NOTE — ED Notes (Signed)
Pt ambulatory to bedside toilet. NAD at this time.

## 2020-11-05 NOTE — ED Notes (Signed)
Pt returning from CT. NAD at this time.

## 2020-11-05 NOTE — ED Notes (Signed)
Pt returned call, explained that Dr. Derrill Kay felt it appropriate for her to return for re-evaluation.  Pt states she understands and will call back when she is en route.

## 2020-11-05 NOTE — ED Notes (Signed)
Per Dr. Derrill Kay, call pt back for positive blood cultures and have her return to ER.  Attempted to call pt, no answer, message left.

## 2020-11-06 ENCOUNTER — Other Ambulatory Visit: Payer: Self-pay

## 2020-11-06 ENCOUNTER — Encounter: Payer: Self-pay | Admitting: Emergency Medicine

## 2020-11-06 ENCOUNTER — Inpatient Hospital Stay
Admission: EM | Admit: 2020-11-06 | Discharge: 2020-11-08 | DRG: 690 | Disposition: A | Discharge status: Home / Self Care | Payer: Medicare Other | Attending: Internal Medicine | Admitting: Internal Medicine

## 2020-11-06 DIAGNOSIS — E785 Hyperlipidemia, unspecified: Secondary | ICD-10-CM | POA: Diagnosis present

## 2020-11-06 DIAGNOSIS — I10 Essential (primary) hypertension: Secondary | ICD-10-CM | POA: Diagnosis present

## 2020-11-06 DIAGNOSIS — N183 Chronic kidney disease, stage 3 unspecified: Secondary | ICD-10-CM | POA: Diagnosis present

## 2020-11-06 DIAGNOSIS — Z9842 Cataract extraction status, left eye: Secondary | ICD-10-CM

## 2020-11-06 DIAGNOSIS — N10 Acute tubulo-interstitial nephritis: Secondary | ICD-10-CM | POA: Diagnosis present

## 2020-11-06 DIAGNOSIS — F32A Depression, unspecified: Secondary | ICD-10-CM | POA: Diagnosis present

## 2020-11-06 DIAGNOSIS — Z961 Presence of intraocular lens: Secondary | ICD-10-CM | POA: Diagnosis present

## 2020-11-06 DIAGNOSIS — Z79899 Other long term (current) drug therapy: Secondary | ICD-10-CM

## 2020-11-06 DIAGNOSIS — Z881 Allergy status to other antibiotic agents status: Secondary | ICD-10-CM

## 2020-11-06 DIAGNOSIS — Z9841 Cataract extraction status, right eye: Secondary | ICD-10-CM | POA: Diagnosis not present

## 2020-11-06 DIAGNOSIS — Z91013 Allergy to seafood: Secondary | ICD-10-CM | POA: Diagnosis not present

## 2020-11-06 DIAGNOSIS — Z888 Allergy status to other drugs, medicaments and biological substances status: Secondary | ICD-10-CM

## 2020-11-06 DIAGNOSIS — E039 Hypothyroidism, unspecified: Secondary | ICD-10-CM | POA: Diagnosis present

## 2020-11-06 DIAGNOSIS — Z79890 Hormone replacement therapy (postmenopausal): Secondary | ICD-10-CM

## 2020-11-06 DIAGNOSIS — K219 Gastro-esophageal reflux disease without esophagitis: Secondary | ICD-10-CM | POA: Diagnosis present

## 2020-11-06 DIAGNOSIS — Z7982 Long term (current) use of aspirin: Secondary | ICD-10-CM

## 2020-11-06 DIAGNOSIS — Z885 Allergy status to narcotic agent status: Secondary | ICD-10-CM | POA: Diagnosis not present

## 2020-11-06 DIAGNOSIS — Z20822 Contact with and (suspected) exposure to covid-19: Secondary | ICD-10-CM | POA: Diagnosis present

## 2020-11-06 DIAGNOSIS — Z6841 Body Mass Index (BMI) 40.0 and over, adult: Secondary | ICD-10-CM

## 2020-11-06 DIAGNOSIS — R7881 Bacteremia: Secondary | ICD-10-CM | POA: Diagnosis present

## 2020-11-06 DIAGNOSIS — Z803 Family history of malignant neoplasm of breast: Secondary | ICD-10-CM

## 2020-11-06 DIAGNOSIS — Z91040 Latex allergy status: Secondary | ICD-10-CM

## 2020-11-06 DIAGNOSIS — N12 Tubulo-interstitial nephritis, not specified as acute or chronic: Secondary | ICD-10-CM | POA: Diagnosis present

## 2020-11-06 DIAGNOSIS — N1330 Unspecified hydronephrosis: Secondary | ICD-10-CM | POA: Diagnosis present

## 2020-11-06 DIAGNOSIS — N1831 Chronic kidney disease, stage 3a: Secondary | ICD-10-CM | POA: Diagnosis present

## 2020-11-06 DIAGNOSIS — Z87440 Personal history of urinary (tract) infections: Secondary | ICD-10-CM

## 2020-11-06 DIAGNOSIS — Z8719 Personal history of other diseases of the digestive system: Secondary | ICD-10-CM

## 2020-11-06 DIAGNOSIS — Z880 Allergy status to penicillin: Secondary | ICD-10-CM

## 2020-11-06 DIAGNOSIS — J309 Allergic rhinitis, unspecified: Secondary | ICD-10-CM | POA: Diagnosis present

## 2020-11-06 DIAGNOSIS — I129 Hypertensive chronic kidney disease with stage 1 through stage 4 chronic kidney disease, or unspecified chronic kidney disease: Secondary | ICD-10-CM | POA: Diagnosis present

## 2020-11-06 DIAGNOSIS — B9620 Unspecified Escherichia coli [E. coli] as the cause of diseases classified elsewhere: Secondary | ICD-10-CM | POA: Diagnosis present

## 2020-11-06 DIAGNOSIS — Z791 Long term (current) use of non-steroidal anti-inflammatories (NSAID): Secondary | ICD-10-CM

## 2020-11-06 LAB — COMPREHENSIVE METABOLIC PANEL
ALT: 13 U/L (ref 0–44)
AST: 30 U/L (ref 15–41)
Albumin: 3.3 g/dL — ABNORMAL LOW (ref 3.5–5.0)
Alkaline Phosphatase: 77 U/L (ref 38–126)
Anion gap: 10 (ref 5–15)
BUN: 14 mg/dL (ref 8–23)
CO2: 25 mmol/L (ref 22–32)
Calcium: 8.8 mg/dL — ABNORMAL LOW (ref 8.9–10.3)
Chloride: 107 mmol/L (ref 98–111)
Creatinine, Ser: 1.09 mg/dL — ABNORMAL HIGH (ref 0.44–1.00)
GFR, Estimated: 54 mL/min — ABNORMAL LOW (ref >60)
Glucose, Bld: 108 mg/dL — ABNORMAL HIGH (ref 70–99)
Potassium: 3.7 mmol/L (ref 3.5–5.1)
Sodium: 142 mmol/L (ref 135–145)
Total Bilirubin: 0.7 mg/dL (ref 0.3–1.2)
Total Protein: 6.9 g/dL (ref 6.5–8.1)

## 2020-11-06 LAB — RESP PANEL BY RT-PCR (FLU A&B, COVID) ARPGX2
Influenza A by PCR: NEGATIVE
Influenza B by PCR: NEGATIVE
SARS Coronavirus 2 by RT PCR: NEGATIVE

## 2020-11-06 LAB — PROTIME-INR
INR: 1 (ref 0.8–1.2)
Prothrombin Time: 12.7 seconds (ref 11.4–15.2)

## 2020-11-06 LAB — CBC
HCT: 35.3 % — ABNORMAL LOW (ref 36.0–46.0)
Hemoglobin: 11.6 g/dL — ABNORMAL LOW (ref 12.0–15.0)
MCH: 29.9 pg (ref 26.0–34.0)
MCHC: 32.9 g/dL (ref 30.0–36.0)
MCV: 91 fL (ref 80.0–100.0)
Platelets: 149 10*3/uL — ABNORMAL LOW (ref 150–400)
RBC: 3.88 MIL/uL (ref 3.87–5.11)
RDW: 13.9 % (ref 11.5–15.5)
WBC: 5.9 10*3/uL (ref 4.0–10.5)
nRBC: 0 % (ref 0.0–0.2)

## 2020-11-06 LAB — APTT: aPTT: 42 seconds — ABNORMAL HIGH (ref 24–36)

## 2020-11-06 LAB — LACTIC ACID, PLASMA: Lactic Acid, Venous: 0.9 mmol/L (ref 0.5–1.9)

## 2020-11-06 MED ORDER — HEPARIN SODIUM (PORCINE) 5000 UNIT/ML IJ SOLN
5000.0000 [IU] | Freq: Three times a day (TID) | INTRAMUSCULAR | Status: DC
  Administered 2020-11-06 – 2020-11-08 (×6): 5000 [IU] via SUBCUTANEOUS
  Filled 2020-11-06 (×6): qty 1

## 2020-11-06 MED ORDER — ACETAMINOPHEN 325 MG PO TABS
650.0000 mg | ORAL_TABLET | Freq: Four times a day (QID) | ORAL | Status: DC | PRN
  Administered 2020-11-06: 650 mg via ORAL
  Filled 2020-11-06 (×2): qty 2

## 2020-11-06 MED ORDER — FENTANYL CITRATE (PF) 100 MCG/2ML IJ SOLN: 12.5000 ug | INTRAMUSCULAR | Status: DC | PRN

## 2020-11-06 MED ORDER — FENTANYL CITRATE (PF) 100 MCG/2ML IJ SOLN
50.0000 ug | Freq: Once | INTRAMUSCULAR | Status: AC
  Administered 2020-11-06: 50 ug via INTRAVENOUS
  Filled 2020-11-06: qty 2

## 2020-11-06 MED ORDER — SODIUM CHLORIDE 0.9 % IV SOLN: 1.0000 g | INTRAVENOUS | Status: DC

## 2020-11-06 MED ORDER — SODIUM CHLORIDE 0.9 % IV SOLN
1.0000 g | Freq: Once | INTRAVENOUS | Status: AC
  Administered 2020-11-06: 1 g via INTRAVENOUS
  Filled 2020-11-06: qty 10

## 2020-11-06 MED ORDER — SODIUM CHLORIDE 0.9 % IV SOLN
2.0000 g | INTRAVENOUS | Status: DC
  Administered 2020-11-06 – 2020-11-07 (×2): 2 g via INTRAVENOUS
  Filled 2020-11-06 (×3): qty 20

## 2020-11-06 MED ORDER — HYDRALAZINE HCL 20 MG/ML IJ SOLN: 5.0000 mg | INTRAMUSCULAR | Status: DC | PRN

## 2020-11-06 MED ORDER — DIPHENHYDRAMINE HCL 25 MG PO CAPS
25.0000 mg | ORAL_CAPSULE | Freq: Four times a day (QID) | ORAL | Status: DC | PRN
  Administered 2020-11-06 – 2020-11-07 (×2): 25 mg via ORAL
  Filled 2020-11-06 (×2): qty 1

## 2020-11-06 MED ORDER — ONDANSETRON HCL 4 MG/2ML IJ SOLN: 4.0000 mg | Freq: Three times a day (TID) | INTRAMUSCULAR | Status: DC | PRN

## 2020-11-06 NOTE — ED Triage Notes (Signed)
Patient states that she was seen here earlier and called back for abnormal labs.

## 2020-11-06 NOTE — ED Notes (Signed)
Transport requested @ 20:16

## 2020-11-06 NOTE — Consult Note (Signed)
Urology Consult   I have been asked to see the patient by Dr. Blaine Hamper, for evaluation and management of pyelonephritis.  Chief Complaint: Right-sided flank pain  HPI:  Janet Galvan is a 74 y.o. year old with no urologic history who was called to return to the ED because of positive blood cultures secondary to pyelonephritis.  She is a 74 year old female who had a Klebsiella UTI 1 month ago that was treated with antibiotics and initially resolved, but over the last few days she noticed returning urinary frequency and some dysuria as well as right-sided flank pain.  She was seen in the ED yesterday and a CT showed mild right hydronephrosis and hydroureter down to the bladder with some stranding, but no nephrolithiasis.  Labs were relatively benign with WBC 10.2, lactic acid 1.3, creatinine 1.01, EGFR 59, and urinalysis concerning for infection with greater than 50 WBCs, WBC clumps, 20-50 RBCs, few bacteria, nitrite positive, large leukocytes.  She was initially managed as an outpatient for pyelonephritis, but was called to return to the ED today when blood cultures returned positive for E. coli.  She is being admitted to the hospitalist service for IV antibiotics.  She overall feels well this afternoon aside from some mild right-sided flank pain.  Vitals are stable and she is afebrile.  She denies any prior history of kidney stones.  No prior urologic surgeries.  She denies any gross hematuria.  She is a never smoker.  There are no aggravating or alleviating factors.  Severity is moderate.  PMH: Past Medical History:  Diagnosis Date  . Allergic rhinitis   . GERD (gastroesophageal reflux disease)   . History of colon polyps   . History of hiatal hernia   . Hyperlipidemia   . Hypertension   . Hypothyroidism   . Lymphedema of both lower extremities   . Obesity   . Osteoarthritis   . Venous stasis     Surgical History: Past Surgical History:  Procedure Laterality Date  . ABDOMINAL  HYSTERECTOMY    . ANKLE ARTHROSCOPY    . BACK SURGERY    . carpal tunnell release     done endoscopic  . CATARACT EXTRACTION W/PHACO Right 09/28/2018   Procedure: CATARACT EXTRACTION PHACO AND INTRAOCULAR LENS PLACEMENT ([place])  RIGHT;  Surgeon: Eulogio Bear, MD;  Location: [place];  Service: Ophthalmology;  Laterality: Right;  . CATARACT EXTRACTION W/PHACO Left 10/19/2018   Procedure: CATARACT EXTRACTION PHACO AND INTRAOCULAR LENS PLACEMENT ([place]) LEFT;  Surgeon: Eulogio Bear, MD;  Location: [place];  Service: Ophthalmology;  Laterality: Left;  . CHOLECYSTECTOMY    . COLONOSCOPY WITH PROPOFOL N/A 06/02/2018   Procedure: COLONOSCOPY WITH PROPOFOL;  Surgeon: Toledo, Benay Pike, MD;  Location: [facility];  Service: Gastroenterology;  Laterality: N/A;  . ESOPHAGOGASTRODUODENOSCOPY (EGD) WITH PROPOFOL N/A 06/02/2018   Procedure: ESOPHAGOGASTRODUODENOSCOPY (EGD) WITH PROPOFOL;  Surgeon: Toledo, Benay Pike, MD;  Location: [facility];  Service: Gastroenterology;  Laterality: N/A;  . EYE SURGERY    . JOINT REPLACEMENT    . TUBAL LIGATION      Allergies:  Allergies  Allergen Reactions  . Latex Other (See Comments) and Rash    Compression stockings cause blisters. Bandaids cause red skin Other reaction(s): Other (See Comments) Other Reaction: blisters  . Morphine Nausea Only    Other reaction(s): Vomiting  . Ciprofloxacin Rash  . Oxycodone Nausea Only    Other reaction(s): Unknown  . Ketoprofen Nausea Only  Other reaction(s): Unknown  . Morphine And Related Other (See Comments)    "crazy" - altered mental status  . Amoxicillin-Pot Clavulanate Nausea Only and Rash  . Shrimp [Shellfish Allergy] Rash    Family History: Family History  Problem Relation Age of Onset  . Breast cancer Maternal Aunt 46    Social History:  reports that she has never smoked. She has never used smokeless tobacco. She reports previous alcohol use. She reports that she does  not use drugs.  ROS: Negative aside from those stated in the HPI.  Physical Exam: BP 110/65   Pulse 74   Temp 98.2 F (36.8 C) (Oral)   Resp (!) 23   Ht 5' (1.524 m)   Wt 99.3 kg   LMP  (LMP Unknown)   SpO2 98%   BMI 42.75 kg/m    Constitutional:  Alert and oriented, No acute distress. Cardiovascular: No clubbing, cyanosis, or edema. Respiratory: Normal respiratory effort, no increased work of breathing. GI: Abdomen is soft, nontender, nondistended, no abdominal masses GU: Mild right CVA tenderness  Laboratory Data: Reviewed, see HPI  Pertinent Imaging: I have personally viewed and interpreted the CT showing mild right hydroureteronephrosis, no evidence of stone disease.  Assessment & Plan:   She is a 75 year old female with right-sided pyelonephritis, reassuring labs, and CT showing mild right hydroureteronephrosis down to the bladder with no evidence of obstructing stone.  She clinically appears well, and was called to return to the ED for positive blood cultures for E. coli and is being admitted to the hospitalist service.  -Agree with antibiotics, follow-up blood cultures -No indication for stent placement at this time, consider CT with contrast if not clinically improving after 48 hours of antibiotics to evaluate for abscess or persistent hydronephrosis -We Galvan arrange urology follow-up in 1 month for renal ultrasound to confirm resolution of mild right-sided hydronephrosis  Billey Co, MD  [place] [address] [place], [place] [address] [phone]

## 2020-11-06 NOTE — ED Provider Notes (Signed)
[facility] Emergency Department Provider Note ____________________________________________   First MD Initiated Contact with Patient 11/06/20 [phone]     (approximate)  I have reviewed the triage vital signs and the nursing notes.  HISTORY  Chief Complaint Blood Infection   HPI Janet Galvan is a 74 y.o. femalewho presents to the ED for evaluation of abnormal labs  Chart review indicates patient was seen about 24 hours ago in our ED, diagnosed with acute pyelonephritis amenable to outpatient management.  She was provided a course of Keflex.  No evidence of sepsis at that time.  Blood and urine cultures were drawn and patient returned to the ED today due to blood culture returning positive with gram-negative rods.  Reflex ID panel returns positive for Enterobacterales and E. Coli.  Patient reports feeling her Keflex prescription and taking 1 pill this morning.  She reports worsening pain to her right flank with associated poor p.o. intake and intermittent nausea without vomiting.  Denies any syncopal episodes, documented fevers, emesis, chest pain, falls, diarrhea.   Past Medical History:  Diagnosis Date  . Allergic rhinitis   . GERD (gastroesophageal reflux disease)   . History of colon polyps   . History of hiatal hernia   . Hyperlipidemia   . Hypertension   . Hypothyroidism   . Lymphedema of both lower extremities   . Obesity   . Osteoarthritis   . Venous stasis     There are no problems to display for this patient.   Past Surgical History:  Procedure Laterality Date  . ABDOMINAL HYSTERECTOMY    . ANKLE ARTHROSCOPY    . BACK SURGERY    . carpal tunnell release     done endoscopic  . CATARACT EXTRACTION W/PHACO Right 09/28/2018   Procedure: CATARACT EXTRACTION PHACO AND INTRAOCULAR LENS PLACEMENT (IOC)  RIGHT;  Surgeon: Nevada Crane, MD;  Location: [facility];  Service: Ophthalmology;  Laterality: Right;  . CATARACT  EXTRACTION W/PHACO Left 10/19/2018   Procedure: CATARACT EXTRACTION PHACO AND INTRAOCULAR LENS PLACEMENT (IOC) LEFT;  Surgeon: Nevada Crane, MD;  Location: [facility];  Service: Ophthalmology;  Laterality: Left;  . CHOLECYSTECTOMY    . COLONOSCOPY WITH PROPOFOL N/A 06/02/2018   Procedure: COLONOSCOPY WITH PROPOFOL;  Surgeon: Toledo, Boykin Nearing, MD;  Location: [facility];  Service: Gastroenterology;  Laterality: N/A;  . ESOPHAGOGASTRODUODENOSCOPY (EGD) WITH PROPOFOL N/A 06/02/2018   Procedure: ESOPHAGOGASTRODUODENOSCOPY (EGD) WITH PROPOFOL;  Surgeon: Toledo, Boykin Nearing, MD;  Location: [facility];  Service: Gastroenterology;  Laterality: N/A;  . EYE SURGERY    . JOINT REPLACEMENT    . TUBAL LIGATION      Prior to Admission medications   Medication Sig Start Date End Date Taking? Authorizing Provider  aspirin EC 81 MG tablet Take 81 mg by mouth daily.    [provider]  celecoxib (CELEBREX) 200 MG capsule Take 200 mg by mouth 2 (two) times daily.    [provider]  cephALEXin (KEFLEX) 500 MG capsule Take 1 capsule (500 mg total) by mouth 3 (three) times daily for 7 days. 11/05/20 11/12/20  Nita Sickle, MD  diclofenac sodium (VOLTAREN) 1 % GEL Apply 2 g topically 2 times daily at 12 noon and 4 pm.    [provider]  DULoxetine (CYMBALTA) 30 MG capsule Take 30 mg by mouth daily.    [provider]  estradiol (ESTRACE) 1 MG tablet Take 1 mg by mouth daily.    [provider]  fluticasone (FLONASE) 50 MCG/ACT nasal spray Place into both nostrils daily as needed for allergies or rhinitis.    [provider]  gabapentin (NEURONTIN) 100 MG capsule Take 100 mg by mouth 3 (three) times daily.    [provider]  levocetirizine (XYZAL) 5 MG tablet Take 5 mg by mouth every evening.    [provider]  levothyroxine (SYNTHROID, LEVOTHROID) 125 MCG tablet Take 112 mcg by mouth daily before breakfast. 1 tab 6  days/week. 1/2 tab on Sunday    [provider]  nabumetone (RELAFEN) 500 MG tablet Take 500 mg by mouth 2 (two) times daily.    [provider]  pantoprazole (PROTONIX) 40 MG tablet Take 40 mg by mouth daily.    [provider]  potassium chloride SA (K-DUR,KLOR-CON) 20 MEQ tablet Take 20 mEq by mouth 5 (five) times daily.    [provider]  rosuvastatin (CRESTOR) 20 MG tablet Take 20 mg by mouth daily.    [provider]  topiramate (TOPAMAX) 25 MG capsule Take 25 mg by mouth 2 (two) times daily.    [provider]    Allergies Latex, Morphine, Ciprofloxacin, Oxycodone, Ketoprofen, Morphine and related, Amoxicillin-pot clavulanate, and Shrimp [shellfish allergy]  Family History  Problem Relation Age of Onset  . Breast cancer Maternal Aunt 14    Social History Social History   Tobacco Use  . Smoking status: Never Smoker  . Smokeless tobacco: Never Used  Vaping Use  . Vaping Use: Never used  Substance Use Topics  . Alcohol use: Not Currently    Alcohol/week: 0.0 standard drinks    Comment: rarely  . Drug use: Never    Review of Systems  Constitutional: No fever/chills Eyes: No visual changes. ENT: No sore throat. Cardiovascular: Denies chest pain. Respiratory: Denies shortness of breath. Gastrointestinal: No abdominal pain. , no vomiting.  No diarrhea.  No constipation. Positive for nausea Genitourinary: Positive for dysuria and right-sided flank pain. Musculoskeletal: Negative for back pain. Skin: Negative for rash. Neurological: Negative for headaches, focal weakness or numbness.  ____________________________________________   PHYSICAL EXAM:  VITAL SIGNS: Vitals:   11/06/20 0651  BP: (!) 143/62  Pulse: 82  Resp: 18  Temp: 98.2 F (36.8 C)  SpO2: 93%     Constitutional: Alert and oriented.  Obese.  Sitting up in bed and appears uncomfortable, but is conversational in full sentences. Eyes:  Conjunctivae are normal. PERRL. EOMI. Head: Atraumatic. Nose: No congestion/rhinnorhea. Mouth/Throat: Mucous membranes are moist.  Oropharynx non-erythematous. Neck: No stridor. No cervical spine tenderness to palpation. Cardiovascular: Normal rate, regular rhythm. Grossly normal heart sounds.  Good peripheral circulation. Respiratory: Normal respiratory effort.  No retractions. Lungs CTAB. Gastrointestinal: Soft , nondistended, Mild suprapubic tenderness without peritoneal features, otherwise benign frontal abdomen.  Right-sided CVA tenderness is present. Musculoskeletal: No lower extremity tenderness nor edema.  No joint effusions. No signs of acute trauma. Neurologic:  Normal speech and language. No gross focal neurologic deficits are appreciated. No gait instability noted. Skin:  Skin is warm, dry and intact. No rash noted. Psychiatric: Mood and affect are normal. Speech and behavior are normal.  ____________________________________________   LABS (all labs ordered are listed, but only abnormal results are displayed)  Labs Reviewed  CBC - Abnormal; Notable for the following components:      Result Value   Hemoglobin 11.6 (*)    HCT 35.3 (*)    Platelets 149 (*)    All other components within normal limits  COMPREHENSIVE METABOLIC PANEL - Abnormal; Notable for the following components:   Glucose, Bld 108 (*)    Creatinine, Ser 1.09 (*)    Calcium 8.8 (*)    Albumin 3.3 (*)    GFR, Estimated 54 (*)    All other components within normal limits  CULTURE, BLOOD (ROUTINE X 2)  CULTURE, BLOOD (ROUTINE X 2)  RESP PANEL BY RT-PCR (FLU A&B, COVID) ARPGX2  LACTIC ACID, PLASMA   ____________________________________________   PROCEDURES and INTERVENTIONS  Procedure(s) performed (including Critical Care):  .1-3 Lead EKG Interpretation Performed by: Delton Prairie, MD Authorized by: Delton Prairie, MD     Interpretation: normal     ECG rate:  80   ECG rate assessment: normal      Rhythm: sinus rhythm     Ectopy: none     Conduction: normal      Medications  cefTRIAXone (ROCEPHIN) 1 g in sodium chloride 0.9 % 100 mL IVPB (1 g Intravenous New Bag/Given 11/06/20 0826)  fentaNYL (SUBLIMAZE) injection 50 mcg (50 mcg Intravenous Given 11/06/20 0825)    ____________________________________________   MDM / ED COURSE   74 year old woman with history of recurrent UTIs presents to the ED 24 hours after being started on Keflex for pyelonephritis as an outpatient, with evidence of bacteremia, requiring IV antibiotics and medical admission.  Hemodynamically stable and normal vitals on room air.  Exam with an uncomfortable-appearing patient complaining of right flank pain, but no evidence of distress, trauma or neurovascular deficits.  Blood work similar to 24 hours ago without leukocytosis.  Blood cultures reviewed from yesterday indicating positive gram-negative rods with E. coli and Enterobacter.  We will provide additional dosing of IV Rocephin, repeat cultures and admit to hospitalist medicine for further work-up and management.     ____________________________________________   FINAL CLINICAL IMPRESSION(S) / ED DIAGNOSES  Final diagnoses:  Bacteremia due to Escherichia coli  Pyelonephritis     ED Discharge Orders    None       Janet Galvan   Note:  This document was prepared using Dragon voice recognition software and may include unintentional dictation errors.   Delton Prairie, MD 11/06/20 [phone]

## 2020-11-06 NOTE — Progress Notes (Signed)
Pharmacy Antibiotic Note  Janet Galvan is a 74 y.o. female admitted on 11/06/2020 with bacteremia.  Pharmacy has been consulted for antibiotic dosing.  Patient was called back to the ED when blood cultures showed gram-negative rods with E.coli present on BCID. Patient was initially treated for pyelonephritis with Cephalexin.  Plan: Patient was started on Ceftriaxone 1g IV q24h. Pharmacist has increased dose to 2g IV q24h to cover bacteremia.  Height: 5' (152.4 cm) Weight: 99.3 kg (218 lb 14.7 oz) IBW/kg (Calculated) : 45.5  Temp (24hrs), Avg:98.2 F (36.8 C), Min:98.2 F (36.8 C), Max:98.2 F (36.8 C)  Recent Labs  Lab 11/04/20 1827 11/05/20 0459 11/06/20 0701 11/06/20 0820  WBC 10.2  --  5.9  --   CREATININE 1.01*  --  1.09*  --   LATICACIDVEN  --  1.3  --  0.9    Estimated Creatinine Clearance: 48.6 mL/min (A) (by C-G formula based on SCr of 1.09 mg/dL (H)).    Allergies  Allergen Reactions  . Latex Other (See Comments) and Rash    Compression stockings cause blisters. Bandaids cause red skin Other reaction(s): Other (See Comments) Other Reaction: blisters  . Morphine Nausea Only    Other reaction(s): Vomiting  . Ciprofloxacin Rash  . Oxycodone Nausea Only    Other reaction(s): Unknown  . Ketoprofen Nausea Only    Other reaction(s): Unknown  . Morphine And Related Other (See Comments)    "crazy" - altered mental status  . Amoxicillin-Pot Clavulanate Nausea Only and Rash  . Shrimp [Shellfish Allergy] Rash    Antimicrobials this admission: Ceftriaxone 12/6 >>   Dose adjustments this admission: 12/6 dose adjusted from 1g to 2g  Microbiology results: 12/5 BCx: E.coli 12/4 UCx: E.coli  12/6 BCx: pending   Thank you for allowing pharmacy to be a part of this patient's care.  Clovia Cuff, PharmD, BCPS 11/06/2020 2:26 PM

## 2020-11-06 NOTE — H&P (Signed)
History and Physical    Janet Galvan:[id number] DOB: [birth year] DOA: 11/06/2020  Referring MD/NP/PA:   PCP: Marguarite Arbour, MD   Patient coming from:  The patient is coming from home.  At baseline, pt is independent for most of ADL.        Chief Complaint: Right flank pain, dysuria and bacteremia  HPI: Janet Galvan is a 74 y.o. female with medical history significant of hypertension, hyperlipidemia, GERD, hypothyroidism, depression, lymphedema, hiatal hernia, CKD stage IIIa, who presents with right flank pain, dysuria and bacteremia.  Patient states that she has been having dysuria, urinary frequency and burning on urination for almost 3 weeks.  Patient was seen in ED yesterday, diagnosed as acute pyelonephritis.  Patient received IV antibiotics and discharged on Keflex.  Blood culture come back positive for Enterococcus/E. coli.  Patient was called back to ED.  Patient states that she had a mild fever on Saturday which has resolved.  Currently patient does not have fever or chills.  She states that had some intermittent nausea, vomiting recently, but not today.  Currently patient does not have nausea, vomiting, diarrhea or abdominal pain.  Patient has mild dry cough, no shortness breath or chest pain.  CT-renal stone protocol on 11/05/20: Mild right hydronephrosis and mild hydroureter with periureteral stranding. No nephroureterolithiasis. This may indicate ascending urinary tract infection.  ED Course: pt was found to have WBC 5.9, negative Covid PCR, lactic acid 1.3, 0.9, stable renal function, temperature 99.1, blood pressure 143/62, heart rate 82, RR 23, oxygen saturation 93-98% on room air.  Patient is admitted to MedSurg bed as inpatient.  Urology, Dr. Gabrielle Dare is consulted.  Review of Systems:   General: no fevers, chills, no body weight gain, has fatigue HEENT: no blurry vision, hearing changes or sore throat Respiratory: no dyspnea, coughing, wheezing CV: no  chest pain, no palpitations GI: no nausea, vomiting, abdominal pain, diarrhea, constipation GU: has dysuria, burning on urination, increased urinary frequency, no hematuria.  Ext: no leg edema Neuro: no unilateral weakness, numbness, or tingling, no vision change or hearing loss Skin: no rash, no skin tear. MSK: No muscle spasm, no deformity, no limitation of range of movement in spin. Has right flank pain Heme: No easy bruising.  Travel history: No recent long distant travel.  Allergy:  Allergies  Allergen Reactions  . Latex Other (See Comments) and Rash    Compression stockings cause blisters. Bandaids cause red skin Other reaction(s): Other (See Comments) Other Reaction: blisters  . Morphine Nausea Only    Other reaction(s): Vomiting  . Ciprofloxacin Rash  . Oxycodone Nausea Only    Other reaction(s): Unknown  . Ketoprofen Nausea Only    Other reaction(s): Unknown  . Morphine And Related Other (See Comments)    "crazy" - altered mental status  . Amoxicillin-Pot Clavulanate Nausea Only and Rash  . Shrimp [Shellfish Allergy] Rash    Past Medical History:  Diagnosis Date  . Allergic rhinitis   . GERD (gastroesophageal reflux disease)   . History of colon polyps   . History of hiatal hernia   . Hyperlipidemia   . Hypertension   . Hypothyroidism   . Lymphedema of both lower extremities   . Obesity   . Osteoarthritis   . Venous stasis     Past Surgical History:  Procedure Laterality Date  . ABDOMINAL HYSTERECTOMY    . ANKLE ARTHROSCOPY    . BACK SURGERY    . carpal tunnell release  done endoscopic  . CATARACT EXTRACTION W/PHACO Right 09/28/2018   Procedure: CATARACT EXTRACTION PHACO AND INTRAOCULAR LENS PLACEMENT (IOC)  RIGHT;  Surgeon: Nevada Crane, MD;  Location: [facility];  Service: Ophthalmology;  Laterality: Right;  . CATARACT EXTRACTION W/PHACO Left 10/19/2018   Procedure: CATARACT EXTRACTION PHACO AND INTRAOCULAR LENS PLACEMENT (IOC) LEFT;   Surgeon: Nevada Crane, MD;  Location: [facility];  Service: Ophthalmology;  Laterality: Left;  . CHOLECYSTECTOMY    . COLONOSCOPY WITH PROPOFOL N/A 06/02/2018   Procedure: COLONOSCOPY WITH PROPOFOL;  Surgeon: Toledo, Boykin Nearing, MD;  Location: [facility];  Service: Gastroenterology;  Laterality: N/A;  . ESOPHAGOGASTRODUODENOSCOPY (EGD) WITH PROPOFOL N/A 06/02/2018   Procedure: ESOPHAGOGASTRODUODENOSCOPY (EGD) WITH PROPOFOL;  Surgeon: Toledo, Boykin Nearing, MD;  Location: [facility];  Service: Gastroenterology;  Laterality: N/A;  . EYE SURGERY    . JOINT REPLACEMENT    . TUBAL LIGATION      Social History:  reports that she has never smoked. She has never used smokeless tobacco. She reports previous alcohol use. She reports that she does not use drugs.  Family History:  Family History  Problem Relation Age of Onset  . Breast cancer Maternal Aunt 48     Prior to Admission medications   Medication Sig Start Date End Date Taking? Authorizing Provider  aspirin EC 81 MG tablet Take 81 mg by mouth daily.    [provider]  celecoxib (CELEBREX) 200 MG capsule Take 200 mg by mouth 2 (two) times daily.    [provider]  cephALEXin (KEFLEX) 500 MG capsule Take 1 capsule (500 mg total) by mouth 3 (three) times daily for 7 days. 11/05/20 11/12/20  Nita Sickle, MD  diclofenac sodium (VOLTAREN) 1 % GEL Apply 2 g topically 2 times daily at 12 noon and 4 pm.    [provider]  DULoxetine (CYMBALTA) 30 MG capsule Take 30 mg by mouth daily.    [provider]  estradiol (ESTRACE) 1 MG tablet Take 1 mg by mouth daily.    [provider]  fluticasone (FLONASE) 50 MCG/ACT nasal spray Place into both nostrils daily as needed for allergies or rhinitis.    [provider]  gabapentin (NEURONTIN) 100 MG capsule Take 100 mg by mouth 3 (three) times daily.    [provider]  levocetirizine (XYZAL) 5 MG tablet Take 5 mg by mouth  every evening.    [provider]  levothyroxine (SYNTHROID, LEVOTHROID) 125 MCG tablet Take 112 mcg by mouth daily before breakfast. 1 tab 6 days/week. 1/2 tab on Sunday    [provider]  nabumetone (RELAFEN) 500 MG tablet Take 500 mg by mouth 2 (two) times daily.    [provider]  pantoprazole (PROTONIX) 40 MG tablet Take 40 mg by mouth daily.    [provider]  potassium chloride SA (K-DUR,KLOR-CON) 20 MEQ tablet Take 20 mEq by mouth 5 (five) times daily.    [provider]  rosuvastatin (CRESTOR) 20 MG tablet Take 20 mg by mouth daily.    [provider]  topiramate (TOPAMAX) 25 MG capsule Take 25 mg by mouth 2 (two) times daily.    [provider]    Physical Exam: Vitals:   11/06/20 1500 11/06/20 1530 11/06/20 1604 11/06/20 1630  BP: 97/63 (!) 114/59 (!) 130/58 110/65  Pulse: 79 77 81 74  Resp: (!) 21 (!) 23 19 (!) 23  Temp:      TempSrc:  SpO2: 99% 99% 100% 98%  Weight:      Height:       General: Not in acute distress HEENT:       Eyes: PERRL, EOMI, no scleral icterus.       ENT: No discharge from the ears and nose, no pharynx injection, no tonsillar enlargement.        Neck: No JVD, no bruit, no mass felt. Heme: No neck lymph node enlargement. Cardiac: S1/S2, RRR, No murmurs, No gallops or rubs. Respiratory: No rales, wheezing, rhonchi or rubs. GI: Soft, nondistended, nontender, no rebound pain, no organomegaly, BS present. GU: positive right CVA tenderness Ext: No pitting leg edema bilaterally. 2+DP/PT pulse bilaterally. Musculoskeletal: No joint deformities, No joint redness or warmth, no limitation of ROM in spin. Skin: No rashes.  Neuro: Alert, oriented X3, cranial nerves II-XII grossly intact, moves all extremities normally. Psych: Patient is not psychotic, no suicidal or hemocidal ideation.  Labs on Admission: I have personally reviewed following labs and imaging studies  CBC: Recent Labs   Lab 11/04/20 1827 11/06/20 0701  WBC 10.2 5.9  HGB 11.6* 11.6*  HCT 34.8* 35.3*  MCV 90.9 91.0  PLT 117* 149*   Basic Metabolic Panel: Recent Labs  Lab 11/04/20 1827 11/06/20 0701  NA 137 142  K 3.9 3.7  CL 104 107  CO2 26 25  GLUCOSE 108* 108*  BUN 17 14  CREATININE 1.01* 1.09*  CALCIUM 8.6* 8.8*   GFR: Estimated Creatinine Clearance: 48.6 mL/min (A) (by C-G formula based on SCr of 1.09 mg/dL (H)). Liver Function Tests: Recent Labs  Lab 11/06/20 0701  AST 30  ALT 13  ALKPHOS 77  BILITOT 0.7  PROT 6.9  ALBUMIN 3.3*   No results for input(s): LIPASE, AMYLASE in the last 168 hours. No results for input(s): AMMONIA in the last 168 hours. Coagulation Profile: No results for input(s): INR, PROTIME in the last 168 hours. Cardiac Enzymes: No results for input(s): CKTOTAL, CKMB, CKMBINDEX, TROPONINI in the last 168 hours. BNP (last 3 results) No results for input(s): PROBNP in the last 8760 hours. HbA1C: No results for input(s): HGBA1C in the last 72 hours. CBG: No results for input(s): GLUCAP in the last 168 hours. Lipid Profile: No results for input(s): CHOL, HDL, LDLCALC, TRIG, CHOLHDL, LDLDIRECT in the last 72 hours. Thyroid Function Tests: No results for input(s): TSH, T4TOTAL, FREET4, T3FREE, THYROIDAB in the last 72 hours. Anemia Panel: No results for input(s): VITAMINB12, FOLATE, FERRITIN, TIBC, IRON, RETICCTPCT in the last 72 hours. Urine analysis:    Component Value Date/Time   COLORURINE YELLOW (A) 11/04/2020 1833   APPEARANCEUR TURBID (A) 11/04/2020 1833   LABSPEC 1.015 11/04/2020 1833   PHURINE 5.0 11/04/2020 1833   GLUCOSEU NEGATIVE 11/04/2020 1833   HGBUR MODERATE (A) 11/04/2020 1833   BILIRUBINUR NEGATIVE 11/04/2020 1833   KETONESUR NEGATIVE 11/04/2020 1833   PROTEINUR 100 (A) 11/04/2020 1833   NITRITE POSITIVE (A) 11/04/2020 1833   LEUKOCYTESUR LARGE (A) 11/04/2020 1833   Sepsis Labs: @LABRCNTIP (procalcitonin:4,lacticidven:4) ) Recent  Results (from the past 240 hour(s))  Urine Culture     Status: Abnormal (Preliminary result)   Collection Time: 11/04/20  6:33 PM   Specimen: Urine, Random  Result Value Ref Range Status   Specimen Description   Final    URINE, RANDOM Performed at [facility], [address] [place], [place] [address]    Special Requests   Final    NONE Performed at [facility], [phone]  [address] [place], [place] [address]    Culture (A)  Final    >=100,000 COLONIES/mL ESCHERICHIA COLI SUSCEPTIBILITIES TO FOLLOW Performed at [facility], [address], [place], [place] [address]    Report Status PENDING  Incomplete  Blood culture (single)     Status: Abnormal (Preliminary result)   Collection Time: 11/05/20  4:59 AM   Specimen: BLOOD  Result Value Ref Range Status   Specimen Description   Final    BLOOD RIGHT ANTECUBITAL Performed at [facility], [address] [place], [place] [address]    Special Requests   Final    BOTTLES DRAWN AEROBIC AND ANAEROBIC Blood Culture adequate volume Performed at [facility], [address] [place], [place] [address]    Culture  Setup Time   Final    GRAM NEGATIVE RODS IN BOTH AEROBIC AND ANAEROBIC BOTTLES CRITICAL RESULT CALLED TO, READ BACK BY AND VERIFIED WITH: SAMANTA RAUER AT 1742 ON 11/05/20 BY SS    Culture (A)  Final    ESCHERICHIA COLI SUSCEPTIBILITIES TO FOLLOW Performed at [facility], [address] [place], [place] [address]    Report Status PENDING  Incomplete  Blood Culture ID Panel (Reflexed)     Status: Abnormal   Collection Time: 11/05/20  4:59 AM  Result Value Ref Range Status   Enterococcus faecalis NOT DETECTED NOT DETECTED Final   Enterococcus Faecium NOT DETECTED NOT DETECTED Final   Listeria monocytogenes NOT DETECTED NOT DETECTED Final   Staphylococcus species NOT DETECTED NOT DETECTED Final   Staphylococcus aureus (BCID) NOT DETECTED NOT DETECTED Final    Staphylococcus epidermidis NOT DETECTED NOT DETECTED Final   Staphylococcus lugdunensis NOT DETECTED NOT DETECTED Final   Streptococcus species NOT DETECTED NOT DETECTED Final   Streptococcus agalactiae NOT DETECTED NOT DETECTED Final   Streptococcus pneumoniae NOT DETECTED NOT DETECTED Final   Streptococcus pyogenes NOT DETECTED NOT DETECTED Final   A.calcoaceticus-baumannii NOT DETECTED NOT DETECTED Final   Bacteroides fragilis NOT DETECTED NOT DETECTED Final   Enterobacterales DETECTED (A) NOT DETECTED Final    Comment: Enterobacterales represent a large order of gram negative bacteria, not a single organism. CRITICAL RESULT CALLED TO, READ BACK BY AND VERIFIED WITH: SAMANTA RAUER AT 1742 ON 11/05/20 BY SS    Enterobacter cloacae complex NOT DETECTED NOT DETECTED Final   Escherichia coli DETECTED (A) NOT DETECTED Final    Comment: CRITICAL RESULT CALLED TO, READ BACK BY AND VERIFIED WITH: SAMANTA RAUER AT 1742 ON 11/05/20 BY SS    Klebsiella aerogenes NOT DETECTED NOT DETECTED Final   Klebsiella oxytoca NOT DETECTED NOT DETECTED Final   Klebsiella pneumoniae NOT DETECTED NOT DETECTED Final   Proteus species NOT DETECTED NOT DETECTED Final   Salmonella species NOT DETECTED NOT DETECTED Final   Serratia marcescens NOT DETECTED NOT DETECTED Final   Haemophilus influenzae NOT DETECTED NOT DETECTED Final   Neisseria meningitidis NOT DETECTED NOT DETECTED Final   Pseudomonas aeruginosa NOT DETECTED NOT DETECTED Final   Stenotrophomonas maltophilia NOT DETECTED NOT DETECTED Final   Candida albicans NOT DETECTED NOT DETECTED Final   Candida auris NOT DETECTED NOT DETECTED Final   Candida glabrata NOT DETECTED NOT DETECTED Final   Candida krusei NOT DETECTED NOT DETECTED Final   Candida parapsilosis NOT DETECTED NOT DETECTED Final   Candida tropicalis NOT DETECTED NOT DETECTED Final   Cryptococcus neoformans/gattii NOT DETECTED NOT DETECTED Final   CTX-M ESBL NOT DETECTED NOT DETECTED  Final  Carbapenem resistance IMP NOT DETECTED NOT DETECTED Final   Carbapenem resistance KPC NOT DETECTED NOT DETECTED Final   Carbapenem resistance NDM NOT DETECTED NOT DETECTED Final   Carbapenem resist OXA 48 LIKE NOT DETECTED NOT DETECTED Final   Carbapenem resistance VIM NOT DETECTED NOT DETECTED Final    Comment: Performed at [facility], [address] [place], [place] [address]  Blood culture (routine x 2)     Status: None (Preliminary result)   Collection Time: 11/06/20  7:01 AM   Specimen: BLOOD  Result Value Ref Range Status   Specimen Description BLOOD RIGHT ANTECUBITAL  Final   Special Requests   Final    BOTTLES DRAWN AEROBIC AND ANAEROBIC Blood Culture adequate volume   Culture   Final    NO GROWTH <12 HOURS Performed at [facility], [address], [place], [place] [address]    Report Status PENDING  Incomplete  Resp Panel by RT-PCR (Flu A&B, Covid) Nasopharyngeal Swab     Status: None   Collection Time: 11/06/20  8:20 AM   Specimen: Nasopharyngeal Swab; Nasopharyngeal(NP) swabs in vial transport medium  Result Value Ref Range Status   SARS Coronavirus 2 by RT PCR NEGATIVE NEGATIVE Final    Comment: (NOTE) SARS-CoV-2 target nucleic acids are NOT DETECTED.  The SARS-CoV-2 RNA is generally detectable in upper respiratory specimens during the acute phase of infection. The lowest concentration of SARS-CoV-2 viral copies this assay can detect is 138 copies/mL. A negative result does not preclude SARS-Cov-2 infection and should not be used as the sole basis for treatment or other patient management decisions. A negative result may occur with  improper specimen collection/handling, submission of specimen other than nasopharyngeal swab, presence of viral mutation(s) within the areas targeted by this assay, and inadequate number of viral copies(<138 copies/mL). A negative result must be combined with clinical observations, patient history, and  epidemiological information. The expected result is Negative.  Fact Sheet for Patients:  BloggerCourse.com  Fact Sheet for Healthcare Providers:  SeriousBroker.it  This test is no t yet approved or cleared by the [place] FDA and  has been authorized for detection and/or diagnosis of SARS-CoV-2 by FDA under an Emergency Use Authorization (EUA). This EUA will remain  in effect (meaning this test can be used) for the duration of the COVID-19 declaration under Section 564(b)(1) of the Act, 21 U.S.C.section 360bbb-3(b)(1), unless the authorization is terminated  or revoked sooner.       Influenza A by PCR NEGATIVE NEGATIVE Final   Influenza B by PCR NEGATIVE NEGATIVE Final    Comment: (NOTE) The Xpert Xpress SARS-CoV-2/FLU/RSV plus assay is intended as an aid in the diagnosis of influenza from Nasopharyngeal swab specimens and should not be used as a sole basis for treatment. Nasal washings and aspirates are unacceptable for Xpert Xpress SARS-CoV-2/FLU/RSV testing.  Fact Sheet for Patients: BloggerCourse.com  Fact Sheet for Healthcare Providers: SeriousBroker.it  This test is not yet approved or cleared by the [place] FDA and has been authorized for detection and/or diagnosis of SARS-CoV-2 by FDA under an Emergency Use Authorization (EUA). This EUA will remain in effect (meaning this test can be used) for the duration of the COVID-19 declaration under Section 564(b)(1) of the Act, 21 U.S.C. section 360bbb-3(b)(1), unless the authorization is terminated or revoked.  Performed at [facility], [address], [place], [place] [address]      Radiological Exams on Admission: CT Renal Stone Study  Result Date: 11/05/2020  CLINICAL DATA:  Flank pain EXAM: CT ABDOMEN AND PELVIS WITHOUT CONTRAST TECHNIQUE: Multidetector CT imaging of the abdomen and pelvis was  performed following the standard protocol without IV contrast. COMPARISON:  None. FINDINGS: LOWER CHEST: Normal. HEPATOBILIARY: Normal hepatic contours. No intra- or extrahepatic biliary dilatation. Status post cholecystectomy. PANCREAS: Normal pancreas. No ductal dilatation or peripancreatic fluid collection. SPLEEN: Normal. ADRENALS/URINARY TRACT: The adrenal glands are normal. There is mild right hydronephrosis. There is periureteral stranding and mild hydroureter. No nephroureterolithiasis. The left kidney is normal. The urinary bladder is normal for degree of distention STOMACH/BOWEL: There is no hiatal hernia. Normal duodenal course and caliber. No small bowel dilatation or inflammation. No focal colonic abnormality. Normal appendix. VASCULAR/LYMPHATIC: Normal course and caliber of the major abdominal vessels. No abdominal or pelvic lymphadenopathy. REPRODUCTIVE: Status post hysterectomy. No adnexal mass. MUSCULOSKELETAL. No bony spinal canal stenosis or focal osseous abnormality. OTHER: None. IMPRESSION: Mild right hydronephrosis and mild hydroureter with periureteral stranding. No nephroureterolithiasis. This may indicate ascending urinary tract infection. Electronically Signed   By: Deatra Robinson M.D.   On: 11/05/2020 04:23     EKG:  Not done in ED, will get one.   Assessment/Plan Principal Problem:   Acute pyelonephritis Active Problems:   Bacteremia   GERD (gastroesophageal reflux disease)   Hyperlipidemia   Hypertension   Hypothyroidism   Depression   CKD (chronic kidney disease), stage IIIa   Hydronephrosis of right kidney   Bacteremia due to acute pyelonephritis and hdronephrosis of right kidney: Blood culture positive for Enterobacterals//E. Coli.  Patient does not have fever or leukocytosis.  Clinically not septic.  Hemodynamically stable.  Urology, Dr.  Marcelle Overlie s consulted -->no stent placement needed at this moment.   - Admit to med-surg as an inpatient -  Ceftriaxone by  IV - Follow up results of urine and blood cx and amend antibiotic regimen if needed per sensitivity results - prn Zofran for nausea and as needed fentanyl and Tylenol for pain   GERD (gastroesophageal reflux disease) -Protonix  Hyperlipidemia -Crestor  Hypertension: Patient's not taking medications, blood pressure 143/62 -IV hydralazine as needed  Hypothyroidism -Synthroid  Depression -Cymbalta  CKD (chronic kidney disease), stage IIIa: Stable -Follow-up of with BMP          DVT ppx: SQ Heparin  Code Status: Full code Family Communication: Yes, patient's daughter by phone Disposition Plan:  Anticipate discharge back to previous environment Consults called:  Dr. Wyatt Mage of urology Admission status: Med-surg bed as inpt     Status is: Inpatient  Remains inpatient appropriate because:Inpatient level of care appropriate due to severity of illness. Patient has multiple comorbidities, now presents with acute pyelonephritis and bacteremia. Patient also has hydronephrosis. Her presentation is highly complicated. Patient is at high risk of deteriorating. Need to be treated in the hospital for at least 2 days.   Dispo: The patient is from: Home              Anticipated d/c is to: Home              Anticipated d/c date is: 2 days              Patient currently is not medically stable to d/c.         Date of Service 11/06/2020    Lorretta Harp [facility]   If 7PM-7AM, please contact night-coverage [email or web address] 11/06/2020, 4:48 PM

## 2020-11-07 LAB — BASIC METABOLIC PANEL
Anion gap: 9 (ref 5–15)
BUN: 14 mg/dL (ref 8–23)
CO2: 24 mmol/L (ref 22–32)
Calcium: 8.1 mg/dL — ABNORMAL LOW (ref 8.9–10.3)
Chloride: 107 mmol/L (ref 98–111)
Creatinine, Ser: 0.88 mg/dL (ref 0.44–1.00)
GFR, Estimated: >60 mL/min (ref >60)
Glucose, Bld: 98 mg/dL (ref 70–99)
Potassium: 3.9 mmol/L (ref 3.5–5.1)
Sodium: 140 mmol/L (ref 135–145)

## 2020-11-07 LAB — URINE CULTURE: Culture: >=100000 — AB

## 2020-11-07 LAB — CBC
HCT: 30.4 % — ABNORMAL LOW (ref 36.0–46.0)
Hemoglobin: 10.2 g/dL — ABNORMAL LOW (ref 12.0–15.0)
MCH: 30.1 pg (ref 26.0–34.0)
MCHC: 33.6 g/dL (ref 30.0–36.0)
MCV: 89.7 fL (ref 80.0–100.0)
Platelets: 151 10*3/uL (ref 150–400)
RBC: 3.39 MIL/uL — ABNORMAL LOW (ref 3.87–5.11)
RDW: 13.7 % (ref 11.5–15.5)
WBC: 5.1 10*3/uL (ref 4.0–10.5)
nRBC: 0 % (ref 0.0–0.2)

## 2020-11-07 MED ORDER — LEVOTHYROXINE SODIUM 112 MCG PO TABS
112.0000 ug | ORAL_TABLET | Freq: Every day | ORAL | Status: DC
  Administered 2020-11-07 – 2020-11-08 (×2): 112 ug via ORAL
  Filled 2020-11-07 (×4): qty 1

## 2020-11-07 MED ORDER — DULOXETINE HCL 30 MG PO CPEP
30.0000 mg | ORAL_CAPSULE | Freq: Every day | ORAL | Status: DC
  Administered 2020-11-07 – 2020-11-08 (×2): 30 mg via ORAL
  Filled 2020-11-07 (×2): qty 1

## 2020-11-07 MED ORDER — TOPIRAMATE 25 MG PO TABS
25.0000 mg | ORAL_TABLET | Freq: Two times a day (BID) | ORAL | Status: DC
  Filled 2020-11-07 (×4): qty 1

## 2020-11-07 MED ORDER — ESTRADIOL 1 MG PO TABS
1.0000 mg | ORAL_TABLET | Freq: Every day | ORAL | Status: DC
  Administered 2020-11-07 – 2020-11-08 (×2): 1 mg via ORAL
  Filled 2020-11-07 (×2): qty 1

## 2020-11-07 MED ORDER — PANTOPRAZOLE SODIUM 40 MG PO TBEC
40.0000 mg | DELAYED_RELEASE_TABLET | Freq: Every day | ORAL | Status: DC
  Administered 2020-11-07: 40 mg via ORAL
  Filled 2020-11-07 (×2): qty 1

## 2020-11-07 MED ORDER — CELECOXIB 200 MG PO CAPS
200.0000 mg | ORAL_CAPSULE | Freq: Every day | ORAL | Status: DC
  Administered 2020-11-07: 200 mg via ORAL
  Filled 2020-11-07 (×3): qty 1

## 2020-11-07 MED ORDER — ASPIRIN EC 81 MG PO TBEC
81.0000 mg | DELAYED_RELEASE_TABLET | Freq: Every day | ORAL | Status: DC
  Administered 2020-11-07 – 2020-11-08 (×2): 81 mg via ORAL
  Filled 2020-11-07 (×2): qty 1

## 2020-11-07 MED ORDER — CETIRIZINE HCL 10 MG PO TABS
10.0000 mg | ORAL_TABLET | Freq: Every evening | ORAL | Status: DC
  Administered 2020-11-07: 10 mg via ORAL
  Filled 2020-11-07 (×2): qty 1

## 2020-11-07 MED ORDER — FLUTICASONE PROPIONATE 50 MCG/ACT NA SUSP
1.0000 | Freq: Every day | NASAL | Status: DC | PRN
  Filled 2020-11-07: qty 16

## 2020-11-07 MED ORDER — DICLOFENAC SODIUM 1 % TD GEL
2.0000 g | Freq: Two times a day (BID) | TRANSDERMAL | Status: DC
  Administered 2020-11-07 – 2020-11-08 (×3): 2 g via TOPICAL
  Filled 2020-11-07: qty 100

## 2020-11-07 MED ORDER — GABAPENTIN 100 MG PO CAPS
100.0000 mg | ORAL_CAPSULE | Freq: Three times a day (TID) | ORAL | Status: DC
  Administered 2020-11-07 – 2020-11-08 (×4): 100 mg via ORAL
  Filled 2020-11-07 (×4): qty 1

## 2020-11-07 MED ORDER — ROSUVASTATIN CALCIUM 20 MG PO TABS
20.0000 mg | ORAL_TABLET | Freq: Every day | ORAL | Status: DC
  Administered 2020-11-07 – 2020-11-08 (×2): 20 mg via ORAL
  Filled 2020-11-07 (×2): qty 1

## 2020-11-07 MED ORDER — HYDROMORPHONE HCL 1 MG/ML IJ SOLN: 1.0000 mg | INTRAMUSCULAR | Status: DC | PRN

## 2020-11-07 NOTE — Progress Notes (Signed)
PROGRESS NOTE    Janet Galvan  OTL:[id number] DOB: 12-Jun-1946 DOA: 11/06/2020 PCP: Marguarite Arbour, MD  Assessment & Plan:   Principal Problem:   Acute pyelonephritis Active Problems:   Bacteremia   GERD (gastroesophageal reflux disease)   Hyperlipidemia   Hypertension   Hypothyroidism   Depression   CKD (chronic kidney disease), stage IIIa   Hydronephrosis of right kidney   Bacteremia: secondary to acute pyelonephritis and hydronephrosis of right kidney.  Blood culture positive for Enterobacterals//E. Coli.  Sepsis r/o. Urology following and recs apprec. Continue on IV ceftriaxone   Pyelonephritis & hydronephrosis of right kidney: continue on IV ceftriaxone, as per cx results. Avoid nephrotoxic meds.   GERD: continue on PPI  HLD: continue on statin   Hypothyroidism: continue on home dose of levothyroxine   Depression:severity unknown. Continue on home dose of duloxetine   CKDIIIa: Cr is trending down from day prior. Avoid nephrotoxic meds   Morbid obesity: BMI 42.7. Complicates overall care and prognosis   ACD: likely secondary to CKD. No need for a transfusion at this time.   DVT prophylaxis: heparin  Code Status:  Full  Family Communication:  Disposition Plan: likely d/c back home    Status is: Inpatient  Remains inpatient appropriate because:Ongoing diagnostic testing needed not appropriate for outpatient work up, IV treatments appropriate due to intensity of illness or inability to take PO and Inpatient level of care appropriate due to severity of illness   Dispo: The patient is from: Home              Anticipated d/c is to: Home              Anticipated d/c date is: 3 days              Patient currently is not medically stable to d/c.      Consultants:   urology   Procedures:   Antimicrobials: ceftriaxone   Subjective: Pt c/o malaise  Objective: Vitals:   11/06/20 2041 11/07/20 0001 11/07/20 0010 11/07/20 0422  BP: (!) 141/106  (!) 132/49 131/60 122/61  Pulse: 82 80 84 83  Resp: 19 20 20 20   Temp: 98.1 F (36.7 C) 98.2 F (36.8 C)  98 F (36.7 C)  TempSrc:      SpO2: 99% 99%  99%  Weight:      Height:       No intake or output data in the 24 hours ending 11/07/20 0713 Filed Weights   11/06/20 0646  Weight: 99.3 kg    Examination:  General exam: Appears calm and comfortable  Respiratory system: Clear to auscultation. Respiratory effort normal. Cardiovascular system: S1 & S2 +. No rubs, gallops or clicks. No pedal edema. Gastrointestinal system: Abdomen is nondistended, soft and nontender.  Normal bowel sounds heard. Central nervous system: Alert and oriented. Moves all 4 extremities  Psychiatry: Judgement and insight appear normal. Mood & affect appropriate.     Data Reviewed: I have personally reviewed following labs and imaging studies  CBC: Recent Labs  Lab 11/04/20 1827 11/06/20 0701 11/07/20 0448  WBC 10.2 5.9 5.1  HGB 11.6* 11.6* 10.2*  HCT 34.8* 35.3* 30.4*  MCV 90.9 91.0 89.7  PLT 117* 149* 151   Basic Metabolic Panel: Recent Labs  Lab 11/04/20 1827 11/06/20 0701 11/07/20 0448  NA 137 142 140  K 3.9 3.7 3.9  CL 104 107 107  CO2 26 25 24   GLUCOSE 108* 108* 98  BUN 17 14  14  CREATININE 1.01* 1.09* 0.88  CALCIUM 8.6* 8.8* 8.1*   GFR: Estimated Creatinine Clearance: 60.2 mL/min (by C-G formula based on SCr of 0.88 mg/dL). Liver Function Tests: Recent Labs  Lab 11/06/20 0701  AST 30  ALT 13  ALKPHOS 77  BILITOT 0.7  PROT 6.9  ALBUMIN 3.3*   No results for input(s): LIPASE, AMYLASE in the last 168 hours. No results for input(s): AMMONIA in the last 168 hours. Coagulation Profile: Recent Labs  Lab 11/06/20 2044  INR 1.0   Cardiac Enzymes: No results for input(s): CKTOTAL, CKMB, CKMBINDEX, TROPONINI in the last 168 hours. BNP (last 3 results) No results for input(s): PROBNP in the last 8760 hours. HbA1C: No results for input(s): HGBA1C in the last 72  hours. CBG: No results for input(s): GLUCAP in the last 168 hours. Lipid Profile: No results for input(s): CHOL, HDL, LDLCALC, TRIG, CHOLHDL, LDLDIRECT in the last 72 hours. Thyroid Function Tests: No results for input(s): TSH, T4TOTAL, FREET4, T3FREE, THYROIDAB in the last 72 hours. Anemia Panel: No results for input(s): VITAMINB12, FOLATE, FERRITIN, TIBC, IRON, RETICCTPCT in the last 72 hours. Sepsis Labs: Recent Labs  Lab 11/05/20 0459 11/06/20 0820  LATICACIDVEN 1.3 0.9    Recent Results (from the past 240 hour(s))  Urine Culture     Status: Abnormal (Preliminary result)   Collection Time: 11/04/20  6:33 PM   Specimen: Urine, Random  Result Value Ref Range Status   Specimen Description   Final    URINE, RANDOM Performed at [facility], [address], [place], [place] [address]    Special Requests   Final    NONE Performed at [facility], [address] [place], [place] [address]    Culture (A)  Final    >=100,000 COLONIES/mL ESCHERICHIA COLI SUSCEPTIBILITIES TO FOLLOW Performed at [facility], [address], [place], [place] [address]    Report Status PENDING  Incomplete  Blood culture (single)     Status: Abnormal (Preliminary result)   Collection Time: 11/05/20  4:59 AM   Specimen: BLOOD  Result Value Ref Range Status   Specimen Description   Final    BLOOD RIGHT ANTECUBITAL Performed at [facility], [address], [place], [place] [address]    Special Requests   Final    BOTTLES DRAWN AEROBIC AND ANAEROBIC Blood Culture adequate volume Performed at [facility], [address] [place], [place] [address]    Culture  Setup Time   Final    GRAM NEGATIVE RODS IN BOTH AEROBIC AND ANAEROBIC BOTTLES CRITICAL RESULT CALLED TO, READ BACK BY AND VERIFIED WITH: SAMANTA RAUER AT 1742 ON 11/05/20 BY SS    Culture (A)  Final    ESCHERICHIA COLI SUSCEPTIBILITIES TO FOLLOW Performed at [facility], [address], [place], [place] [address]    Report Status PENDING  Incomplete  Blood Culture ID Panel (Reflexed)     Status: Abnormal   Collection Time: 11/05/20  4:59 AM  Result Value Ref Range Status   Enterococcus faecalis NOT DETECTED NOT DETECTED Final   Enterococcus Faecium NOT DETECTED NOT DETECTED Final   Listeria monocytogenes NOT DETECTED NOT DETECTED Final   Staphylococcus species NOT DETECTED NOT DETECTED Final   Staphylococcus aureus (BCID) NOT DETECTED NOT DETECTED Final   Staphylococcus epidermidis NOT DETECTED NOT DETECTED Final   Staphylococcus lugdunensis NOT DETECTED NOT DETECTED Final   Streptococcus species NOT DETECTED NOT DETECTED Final   Streptococcus  agalactiae NOT DETECTED NOT DETECTED Final   Streptococcus pneumoniae NOT DETECTED NOT DETECTED Final   Streptococcus pyogenes NOT DETECTED NOT DETECTED Final   A.calcoaceticus-baumannii NOT DETECTED NOT DETECTED Final   Bacteroides fragilis NOT DETECTED NOT DETECTED Final   Enterobacterales DETECTED (A) NOT DETECTED Final    Comment: Enterobacterales represent a large order of gram negative bacteria, not a single organism. CRITICAL RESULT CALLED TO, READ BACK BY AND VERIFIED WITH: SAMANTA RAUER AT 1742 ON 11/05/20 BY SS    Enterobacter cloacae complex NOT DETECTED NOT DETECTED Final   Escherichia coli DETECTED (A) NOT DETECTED Final    Comment: CRITICAL RESULT CALLED TO, READ BACK BY AND VERIFIED WITH: SAMANTA RAUER AT 1742 ON 11/05/20 BY SS    Klebsiella aerogenes NOT DETECTED NOT DETECTED Final   Klebsiella oxytoca NOT DETECTED NOT DETECTED Final   Klebsiella pneumoniae NOT DETECTED NOT DETECTED Final   Proteus species NOT DETECTED NOT DETECTED Final   Salmonella species NOT DETECTED NOT DETECTED Final   Serratia marcescens NOT DETECTED NOT DETECTED Final   Haemophilus influenzae NOT DETECTED NOT DETECTED Final   Neisseria meningitidis NOT DETECTED NOT DETECTED Final   Pseudomonas aeruginosa NOT DETECTED NOT  DETECTED Final   Stenotrophomonas maltophilia NOT DETECTED NOT DETECTED Final   Candida albicans NOT DETECTED NOT DETECTED Final   Candida auris NOT DETECTED NOT DETECTED Final   Candida glabrata NOT DETECTED NOT DETECTED Final   Candida krusei NOT DETECTED NOT DETECTED Final   Candida parapsilosis NOT DETECTED NOT DETECTED Final   Candida tropicalis NOT DETECTED NOT DETECTED Final   Cryptococcus neoformans/gattii NOT DETECTED NOT DETECTED Final   CTX-M ESBL NOT DETECTED NOT DETECTED Final   Carbapenem resistance IMP NOT DETECTED NOT DETECTED Final   Carbapenem resistance KPC NOT DETECTED NOT DETECTED Final   Carbapenem resistance NDM NOT DETECTED NOT DETECTED Final   Carbapenem resist OXA 48 LIKE NOT DETECTED NOT DETECTED Final   Carbapenem resistance VIM NOT DETECTED NOT DETECTED Final    Comment: Performed at [facility], [address] [place], [place] [address]  Blood culture (routine x 2)     Status: None (Preliminary result)   Collection Time: 11/06/20  7:01 AM   Specimen: BLOOD  Result Value Ref Range Status   Specimen Description BLOOD RIGHT ANTECUBITAL  Final   Special Requests   Final    BOTTLES DRAWN AEROBIC AND ANAEROBIC Blood Culture adequate volume   Culture   Final    NO GROWTH <12 HOURS Performed at [facility], [address] [place], [place] [address]    Report Status PENDING  Incomplete  Resp Panel by RT-PCR (Flu A&B, Covid) Nasopharyngeal Swab     Status: None   Collection Time: 11/06/20  8:20 AM   Specimen: Nasopharyngeal Swab; Nasopharyngeal(NP) swabs in vial transport medium  Result Value Ref Range Status   SARS Coronavirus 2 by RT PCR NEGATIVE NEGATIVE Final    Comment: (NOTE) SARS-CoV-2 target nucleic acids are NOT DETECTED.  The SARS-CoV-2 RNA is generally detectable in upper respiratory specimens during the acute phase of infection. The lowest concentration of SARS-CoV-2 viral copies this assay can detect is 138 copies/mL. A  negative result does not preclude SARS-Cov-2 infection and should not be used as the sole basis for treatment or other patient management decisions. A negative result may occur with  improper specimen collection/handling, submission of specimen other than nasopharyngeal swab, presence of viral mutation(s) within the areas targeted by this assay, and  inadequate number of viral copies(<138 copies/mL). A negative result must be combined with clinical observations, patient history, and epidemiological information. The expected result is Negative.  Fact Sheet for Patients:  BloggerCourse.com  Fact Sheet for Healthcare Providers:  SeriousBroker.it  This test is no t yet approved or cleared by the [place] FDA and  has been authorized for detection and/or diagnosis of SARS-CoV-2 by FDA under an Emergency Use Authorization (EUA). This EUA will remain  in effect (meaning this test can be used) for the duration of the COVID-19 declaration under Section 564(b)(1) of the Act, 21 U.S.C.section 360bbb-3(b)(1), unless the authorization is terminated  or revoked sooner.       Influenza A by PCR NEGATIVE NEGATIVE Final   Influenza B by PCR NEGATIVE NEGATIVE Final    Comment: (NOTE) The Xpert Xpress SARS-CoV-2/FLU/RSV plus assay is intended as an aid in the diagnosis of influenza from Nasopharyngeal swab specimens and should not be used as a sole basis for treatment. Nasal washings and aspirates are unacceptable for Xpert Xpress SARS-CoV-2/FLU/RSV testing.  Fact Sheet for Patients: BloggerCourse.com  Fact Sheet for Healthcare Providers: SeriousBroker.it  This test is not yet approved or cleared by the [place] FDA and has been authorized for detection and/or diagnosis of SARS-CoV-2 by FDA under an Emergency Use Authorization (EUA). This EUA will remain in effect (meaning this test can  be used) for the duration of the COVID-19 declaration under Section 564(b)(1) of the Act, 21 U.S.C. section 360bbb-3(b)(1), unless the authorization is terminated or revoked.  Performed at [facility], [address], [place], [place] [address]          Radiology Studies: No results found.      Scheduled Meds: . aspirin EC  81 mg Oral Daily  . celecoxib  200 mg Oral Daily  . cetirizine  10 mg Oral QPM  . diclofenac sodium  2 g Topical q12n4p  . DULoxetine  30 mg Oral Daily  . estradiol  1 mg Oral Daily  . gabapentin  100 mg Oral TID  . heparin  5,000 Units Subcutaneous Q8H  . levothyroxine  112 mcg Oral Daily  . pantoprazole  40 mg Oral Daily  . rosuvastatin  20 mg Oral Daily  . topiramate  25 mg Oral BID   Continuous Infusions: . cefTRIAXone (ROCEPHIN)  IV Stopped (11/06/20 2209)     LOS: 1 day    Time spent: 34 mins     Charise Killian, MD [facility] Pager [phone]  If 7PM-7AM, please contact night-coverage 11/07/2020, 7:13 AM

## 2020-11-07 NOTE — Plan of Care (Signed)

## 2020-11-07 NOTE — Progress Notes (Signed)
Pt is noted with a rash to the left upper arm post ABX therapy. Pt states that it was not there earlier and also endorses a history of reactions to abx therapy. Pt verbally denies SOB or any chest discomfort. Provider is notified of occurrence.

## 2020-11-08 LAB — CBC
HCT: 31.2 % — ABNORMAL LOW (ref 36.0–46.0)
Hemoglobin: 10.3 g/dL — ABNORMAL LOW (ref 12.0–15.0)
MCH: 29.9 pg (ref 26.0–34.0)
MCHC: 33 g/dL (ref 30.0–36.0)
MCV: 90.4 fL (ref 80.0–100.0)
Platelets: 155 10*3/uL (ref 150–400)
RBC: 3.45 MIL/uL — ABNORMAL LOW (ref 3.87–5.11)
RDW: 13.5 % (ref 11.5–15.5)
WBC: 4.1 10*3/uL (ref 4.0–10.5)
nRBC: 0 % (ref 0.0–0.2)

## 2020-11-08 LAB — BASIC METABOLIC PANEL
Anion gap: 8 (ref 5–15)
BUN: 17 mg/dL (ref 8–23)
CO2: 24 mmol/L (ref 22–32)
Calcium: 8.3 mg/dL — ABNORMAL LOW (ref 8.9–10.3)
Chloride: 107 mmol/L (ref 98–111)
Creatinine, Ser: 0.91 mg/dL (ref 0.44–1.00)
GFR, Estimated: >60 mL/min (ref >60)
Glucose, Bld: 91 mg/dL (ref 70–99)
Potassium: 3.9 mmol/L (ref 3.5–5.1)
Sodium: 139 mmol/L (ref 135–145)

## 2020-11-08 LAB — CULTURE, BLOOD (SINGLE): Special Requests: ADEQUATE

## 2020-11-08 MED ORDER — LEVOFLOXACIN 750 MG PO TABS
750.0000 mg | ORAL_TABLET | Freq: Every day | ORAL | Status: DC
  Administered 2020-11-08: 750 mg via ORAL
  Filled 2020-11-08: qty 1

## 2020-11-08 MED ORDER — CEFDINIR 300 MG PO CAPS
300.0000 mg | ORAL_CAPSULE | Freq: Two times a day (BID) | ORAL | Status: DC
  Filled 2020-11-08 (×2): qty 1

## 2020-11-08 MED ORDER — CELECOXIB 200 MG PO CAPS
200.0000 mg | ORAL_CAPSULE | Freq: Every day | ORAL | Status: DC
Start: 2020-11-08 — End: 2021-03-21

## 2020-11-08 MED ORDER — FLUCONAZOLE 150 MG PO TABS: 150.0000 mg | ORAL_TABLET | Freq: Once | ORAL | 0 refills | Status: AC

## 2020-11-08 MED ORDER — LEVOFLOXACIN 750 MG PO TABS: 750.0000 mg | ORAL_TABLET | Freq: Every day | ORAL | 0 refills | Status: AC

## 2020-11-08 NOTE — Plan of Care (Signed)
  Problem: Urinary Elimination: Goal: Signs and symptoms of infection will decrease 11/08/2020 1421 by Orvan Seen, RN Outcome: Completed/Met 11/08/2020 1421 by Orvan Seen, RN Outcome: Progressing   Problem: Education: Goal: Knowledge of General Education information will improve Description: Including pain rating scale, medication(s)/side effects and non-pharmacologic comfort measures 11/08/2020 1421 by Orvan Seen, RN Outcome: Completed/Met 11/08/2020 1421 by Orvan Seen, RN Outcome: Progressing   Problem: Health Behavior/Discharge Planning: Goal: Ability to manage health-related needs will improve 11/08/2020 1421 by Orvan Seen, RN Outcome: Completed/Met 11/08/2020 1421 by Orvan Seen, RN Outcome: Progressing   Problem: Clinical Measurements: Goal: Ability to maintain clinical measurements within normal limits will improve 11/08/2020 1421 by Orvan Seen, RN Outcome: Completed/Met 11/08/2020 1421 by Orvan Seen, RN Outcome: Progressing Goal: Will remain free from infection 11/08/2020 1421 by Orvan Seen, RN Outcome: Completed/Met 11/08/2020 1421 by Orvan Seen, RN Outcome: Progressing Goal: Diagnostic test results will improve 11/08/2020 1421 by Orvan Seen, RN Outcome: Completed/Met 11/08/2020 1421 by Orvan Seen, RN Outcome: Progressing Goal: Respiratory complications will improve 11/08/2020 1421 by Orvan Seen, RN Outcome: Completed/Met 11/08/2020 1421 by Orvan Seen, RN Outcome: Progressing Goal: Cardiovascular complication will be avoided 11/08/2020 1421 by Orvan Seen, RN Outcome: Completed/Met 11/08/2020 1421 by Orvan Seen, RN Outcome: Progressing   Problem: Activity: Goal: Risk for activity intolerance will decrease 11/08/2020 1421 by Orvan Seen, RN Outcome: Completed/Met 11/08/2020 1421 by Orvan Seen, RN Outcome: Progressing   Problem: Nutrition: Goal: Adequate nutrition  will be maintained 11/08/2020 1421 by Orvan Seen, RN Outcome: Completed/Met 11/08/2020 1421 by Orvan Seen, RN Outcome: Progressing   Problem: Coping: Goal: Level of anxiety will decrease 11/08/2020 1421 by Orvan Seen, RN Outcome: Completed/Met 11/08/2020 1421 by Orvan Seen, RN Outcome: Progressing   Problem: Elimination: Goal: Will not experience complications related to bowel motility 11/08/2020 1421 by Orvan Seen, RN Outcome: Completed/Met 11/08/2020 1421 by Orvan Seen, RN Outcome: Progressing Goal: Will not experience complications related to urinary retention 11/08/2020 1421 by Orvan Seen, RN Outcome: Completed/Met 11/08/2020 1421 by Orvan Seen, RN Outcome: Progressing   Problem: Pain Managment: Goal: General experience of comfort will improve 11/08/2020 1421 by Orvan Seen, RN Outcome: Completed/Met 11/08/2020 1421 by Orvan Seen, RN Outcome: Progressing   Problem: Safety: Goal: Ability to remain free from injury will improve 11/08/2020 1421 by Orvan Seen, RN Outcome: Completed/Met 11/08/2020 1421 by Orvan Seen, RN Outcome: Progressing   Problem: Skin Integrity: Goal: Risk for impaired skin integrity will decrease 11/08/2020 1421 by Orvan Seen, RN Outcome: Completed/Met 11/08/2020 1421 by Orvan Seen, RN Outcome: Progressing

## 2020-11-08 NOTE — Progress Notes (Signed)
Patient discharged to home wheeled out of unit by RN with all belongings to meet daughter at Main Entrance who will drive her home.  Discharge instructions and medications reviewed.  Signs and symptoms of infection reviewed.  All questions answered.  PIV removed, no bleeding, intact.  Patient satisfied with overall care at [facility] - [facility]

## 2020-11-08 NOTE — Plan of Care (Signed)

## 2020-11-08 NOTE — Discharge Summary (Addendum)
Physician Discharge Summary  Janet Galvan AYT:[id number] DOB: [birth year] DOA: 11/06/2020  PCP: Marguarite Arbour, MD  Admit date: 11/06/2020 Discharge date: 11/08/2020  Discharge disposition: Home   Recommendations for Outpatient Follow-Up:   Follow up with PCP in 1 week   Discharge Diagnosis:   Principal Problem:   Acute pyelonephritis Active Problems:   E coli bacteremia   GERD (gastroesophageal reflux disease)   Hyperlipidemia   Hypertension   Hypothyroidism   Depression   CKD (chronic kidney disease), stage IIIa   Hydronephrosis of right kidney    Discharge Condition: Stable.  Diet recommendation:  Diet Order            Diet - low sodium heart healthy           Diet Heart Room service appropriate? Yes; Fluid consistency: Thin  Diet effective now                   Code Status: Full Code     Hospital Course:   Ms. Janet Galvan is a 74 year old woman with medical history significant for hypertension, hyperlipidemia, GERD, hypothyroidism, depression, lymphedema, hiatal hernia, CKD stage IIIa.  She presented to the hospital because of right flank pain, dysuria and bacteremia.  She had been having increased urinary frequency and dysuria for about 3 weeks.  She initially presented to the ED where she was diagnosed with acute pyelonephritis.  She was given IV antibiotics in the ED and was discharged home on Keflex.  However, she was called back to the hospital because of E. coli bacteremia.    She was admitted to the hospital for E. coli pyelonephritis and E. coli bacteremia.  She was treated with IV Rocephin.  Her symptoms improved.  She was switched to oral Levaquin to complete antimicrobial therapy as an outpatient.  Initially, there was concern that patient was allergic to ciprofloxacin (rash).  However, it was discovered that patient had actually tolerated quinolones in the past.  After consultation with Juliette Alcide, [facility], decision was made to  initiate Levaquin.  Risks, benefits and alternatives to treatment were discussed with the patient and she was in agreement with the plan.  She tolerated first dose of Levaquin in the hospital without any problems.  She is deemed stable for discharge to home today.     Discharge Exam:    Vitals:   11/08/20 0444 11/08/20 0740 11/08/20 1115 11/08/20 1524  BP: (!) 122/56 (!) 110/49 (!) 103/34 111/60  Pulse: 75 70 76 78  Resp: 17 18 20 20   Temp: 98.4 F (36.9 C) 98 F (36.7 C) 97.6 F (36.4 C) 97.7 F (36.5 C)  TempSrc:      SpO2: 96% 97% 99% 98%  Weight:      Height:         GEN: NAD SKIN: Warm and dry EYES: No pallor or icterus ENT: MMM CV: RRR PULM: CTA B ABD: soft, obese, NT, +BS CNS: AAO x 3, non focal EXT: No edema or tenderness GU: Mild right CVA tenderness   The results of significant diagnostics from this hospitalization (including imaging, microbiology, ancillary and laboratory) are listed below for reference.     Procedures and Diagnostic Studies:   No results found.   Labs:   Basic Metabolic Panel: Recent Labs  Lab 11/04/20 1827 11/04/20 1827 11/06/20 0701 11/06/20 0701 11/07/20 0448 11/08/20 0417  NA 137  --  142  --  140 139  K 3.9   < >  3.7   < > 3.9 3.9  CL 104  --  107  --  107 107  CO2 26  --  25  --  24 24  GLUCOSE 108*  --  108*  --  98 91  BUN 17  --  14  --  14 17  CREATININE 1.01*  --  1.09*  --  0.88 0.91  CALCIUM 8.6*  --  8.8*  --  8.1* 8.3*   < > = values in this interval not displayed.   GFR Estimated Creatinine Clearance: 58.2 mL/min (by C-G formula based on SCr of 0.91 mg/dL). Liver Function Tests: Recent Labs  Lab 11/06/20 0701  AST 30  ALT 13  ALKPHOS 77  BILITOT 0.7  PROT 6.9  ALBUMIN 3.3*   No results for input(s): LIPASE, AMYLASE in the last 168 hours. No results for input(s): AMMONIA in the last 168 hours. Coagulation profile Recent Labs  Lab 11/06/20 2044  INR 1.0    CBC: Recent Labs  Lab  11/04/20 1827 11/06/20 0701 11/07/20 0448 11/08/20 0417  WBC 10.2 5.9 5.1 4.1  HGB 11.6* 11.6* 10.2* 10.3*  HCT 34.8* 35.3* 30.4* 31.2*  MCV 90.9 91.0 89.7 90.4  PLT 117* 149* 151 155   Cardiac Enzymes: No results for input(s): CKTOTAL, CKMB, CKMBINDEX, TROPONINI in the last 168 hours. BNP: Invalid input(s): POCBNP CBG: No results for input(s): GLUCAP in the last 168 hours. D-Dimer No results for input(s): DDIMER in the last 72 hours. Hgb A1c No results for input(s): HGBA1C in the last 72 hours. Lipid Profile No results for input(s): CHOL, HDL, LDLCALC, TRIG, CHOLHDL, LDLDIRECT in the last 72 hours. Thyroid function studies No results for input(s): TSH, T4TOTAL, T3FREE, THYROIDAB in the last 72 hours.  Invalid input(s): FREET3 Anemia work up No results for input(s): VITAMINB12, FOLATE, FERRITIN, TIBC, IRON, RETICCTPCT in the last 72 hours. Microbiology Recent Results (from the past 240 hour(s))  Urine Culture     Status: Abnormal   Collection Time: 11/04/20  6:33 PM   Specimen: Urine, Random  Result Value Ref Range Status   Specimen Description   Final    URINE, RANDOM Performed at [facility], [address] [place], [place] [address]    Special Requests   Final    NONE Performed at [facility], [address] [place], [place] [address]    Culture >=100,000 COLONIES/mL ESCHERICHIA COLI (A)  Final   Report Status 11/07/2020 FINAL  Final   Organism ID, Bacteria ESCHERICHIA COLI (A)  Final      Susceptibility   Escherichia coli - MIC*    AMPICILLIN >=32 RESISTANT Resistant     CEFAZOLIN <=4 SENSITIVE Sensitive     CEFEPIME <=0.12 SENSITIVE Sensitive     CEFTRIAXONE <=0.25 SENSITIVE Sensitive     CIPROFLOXACIN <=0.25 SENSITIVE Sensitive     GENTAMICIN <=1 SENSITIVE Sensitive     IMIPENEM <=0.25 SENSITIVE Sensitive     NITROFURANTOIN <=16 SENSITIVE Sensitive     TRIMETH/SULFA >=320 RESISTANT Resistant     AMPICILLIN/SULBACTAM 4 SENSITIVE  Sensitive     PIP/TAZO <=4 SENSITIVE Sensitive     * >=100,000 COLONIES/mL ESCHERICHIA COLI  Blood culture (single)     Status: Abnormal   Collection Time: 11/05/20  4:59 AM   Specimen: BLOOD  Result Value Ref Range Status   Specimen Description   Final    BLOOD RIGHT ANTECUBITAL Performed at [facility], [address] [place], [place] [address]  Special Requests   Final    BOTTLES DRAWN AEROBIC AND ANAEROBIC Blood Culture adequate volume Performed at [facility], [address] [place], [place] [address]    Culture  Setup Time   Final    GRAM NEGATIVE RODS IN BOTH AEROBIC AND ANAEROBIC BOTTLES CRITICAL RESULT CALLED TO, READ BACK BY AND VERIFIED WITH: SAMANTA RAUER AT 1742 ON 11/05/20 BY SS    Culture ESCHERICHIA COLI (A)  Final   Report Status 11/08/2020 FINAL  Final   Organism ID, Bacteria ESCHERICHIA COLI  Final      Susceptibility   Escherichia coli - MIC*    AMPICILLIN >=32 RESISTANT Resistant     CEFAZOLIN <=4 SENSITIVE Sensitive     CEFEPIME <=0.12 SENSITIVE Sensitive     CEFTAZIDIME <=1 SENSITIVE Sensitive     CEFTRIAXONE <=0.25 SENSITIVE Sensitive     CIPROFLOXACIN <=0.25 SENSITIVE Sensitive     GENTAMICIN <=1 SENSITIVE Sensitive     IMIPENEM <=0.25 SENSITIVE Sensitive     TRIMETH/SULFA >=320 RESISTANT Resistant     AMPICILLIN/SULBACTAM 4 SENSITIVE Sensitive     PIP/TAZO <=4 SENSITIVE Sensitive     * ESCHERICHIA COLI  Blood Culture ID Panel (Reflexed)     Status: Abnormal   Collection Time: 11/05/20  4:59 AM  Result Value Ref Range Status   Enterococcus faecalis NOT DETECTED NOT DETECTED Final   Enterococcus Faecium NOT DETECTED NOT DETECTED Final   Listeria monocytogenes NOT DETECTED NOT DETECTED Final   Staphylococcus species NOT DETECTED NOT DETECTED Final   Staphylococcus aureus (BCID) NOT DETECTED NOT DETECTED Final   Staphylococcus epidermidis NOT DETECTED NOT DETECTED Final   Staphylococcus lugdunensis NOT DETECTED NOT DETECTED  Final   Streptococcus species NOT DETECTED NOT DETECTED Final   Streptococcus agalactiae NOT DETECTED NOT DETECTED Final   Streptococcus pneumoniae NOT DETECTED NOT DETECTED Final   Streptococcus pyogenes NOT DETECTED NOT DETECTED Final   A.calcoaceticus-baumannii NOT DETECTED NOT DETECTED Final   Bacteroides fragilis NOT DETECTED NOT DETECTED Final   Enterobacterales DETECTED (A) NOT DETECTED Final    Comment: Enterobacterales represent a large order of gram negative bacteria, not a single organism. CRITICAL RESULT CALLED TO, READ BACK BY AND VERIFIED WITH: SAMANTA RAUER AT 1742 ON 11/05/20 BY SS    Enterobacter cloacae complex NOT DETECTED NOT DETECTED Final   Escherichia coli DETECTED (A) NOT DETECTED Final    Comment: CRITICAL RESULT CALLED TO, READ BACK BY AND VERIFIED WITH: SAMANTA RAUER AT 1742 ON 11/05/20 BY SS    Klebsiella aerogenes NOT DETECTED NOT DETECTED Final   Klebsiella oxytoca NOT DETECTED NOT DETECTED Final   Klebsiella pneumoniae NOT DETECTED NOT DETECTED Final   Proteus species NOT DETECTED NOT DETECTED Final   Salmonella species NOT DETECTED NOT DETECTED Final   Serratia marcescens NOT DETECTED NOT DETECTED Final   Haemophilus influenzae NOT DETECTED NOT DETECTED Final   Neisseria meningitidis NOT DETECTED NOT DETECTED Final   Pseudomonas aeruginosa NOT DETECTED NOT DETECTED Final   Stenotrophomonas maltophilia NOT DETECTED NOT DETECTED Final   Candida albicans NOT DETECTED NOT DETECTED Final   Candida auris NOT DETECTED NOT DETECTED Final   Candida glabrata NOT DETECTED NOT DETECTED Final   Candida krusei NOT DETECTED NOT DETECTED Final   Candida parapsilosis NOT DETECTED NOT DETECTED Final   Candida tropicalis NOT DETECTED NOT DETECTED Final   Cryptococcus neoformans/gattii NOT DETECTED NOT DETECTED Final   CTX-M ESBL NOT DETECTED NOT DETECTED Final   Carbapenem resistance IMP NOT DETECTED  NOT DETECTED Final   Carbapenem resistance KPC NOT DETECTED NOT  DETECTED Final   Carbapenem resistance NDM NOT DETECTED NOT DETECTED Final   Carbapenem resist OXA 48 LIKE NOT DETECTED NOT DETECTED Final   Carbapenem resistance VIM NOT DETECTED NOT DETECTED Final    Comment: Performed at [facility], [address] [place], [place] [address]  Blood culture (routine x 2)     Status: None (Preliminary result)   Collection Time: 11/06/20  7:01 AM   Specimen: BLOOD  Result Value Ref Range Status   Specimen Description BLOOD RIGHT ANTECUBITAL  Final   Special Requests   Final    BOTTLES DRAWN AEROBIC AND ANAEROBIC Blood Culture adequate volume   Culture   Final    NO GROWTH 2 DAYS Performed at [facility], [address] [place], [place] [address]    Report Status PENDING  Incomplete  Blood culture (routine x 2)     Status: None (Preliminary result)   Collection Time: 11/06/20  8:20 AM   Specimen: BLOOD  Result Value Ref Range Status   Specimen Description BLOOD RIGHT ANTECUBITAL  Final   Special Requests   Final    BOTTLES DRAWN AEROBIC AND ANAEROBIC Blood Culture adequate volume   Culture   Final    NO GROWTH 2 DAYS Performed at [facility], [address] [place], [place] [address]    Report Status PENDING  Incomplete  Resp Panel by RT-PCR (Flu A&B, Covid) Nasopharyngeal Swab     Status: None   Collection Time: 11/06/20  8:20 AM   Specimen: Nasopharyngeal Swab; Nasopharyngeal(NP) swabs in vial transport medium  Result Value Ref Range Status   SARS Coronavirus 2 by RT PCR NEGATIVE NEGATIVE Final    Comment: (NOTE) SARS-CoV-2 target nucleic acids are NOT DETECTED.  The SARS-CoV-2 RNA is generally detectable in upper respiratory specimens during the acute phase of infection. The lowest concentration of SARS-CoV-2 viral copies this assay can detect is 138 copies/mL. A negative result does not preclude SARS-Cov-2 infection and should not be used as the sole basis for treatment or other patient management  decisions. A negative result may occur with  improper specimen collection/handling, submission of specimen other than nasopharyngeal swab, presence of viral mutation(s) within the areas targeted by this assay, and inadequate number of viral copies(<138 copies/mL). A negative result must be combined with clinical observations, patient history, and epidemiological information. The expected result is Negative.  Fact Sheet for Patients:  BloggerCourse.comhttps://www.fda.gov/media/152166/download  Fact Sheet for Healthcare Providers:  SeriousBroker.ithttps://www.fda.gov/media/152162/download  This test is no t yet approved or cleared by the [place] FDA and  has been authorized for detection and/or diagnosis of SARS-CoV-2 by FDA under an Emergency Use Authorization (EUA). This EUA will remain  in effect (meaning this test can be used) for the duration of the COVID-19 declaration under Section 564(b)(1) of the Act, 21 U.S.C.section 360bbb-3(b)(1), unless the authorization is terminated  or revoked sooner.       Influenza A by PCR NEGATIVE NEGATIVE Final   Influenza B by PCR NEGATIVE NEGATIVE Final    Comment: (NOTE) The Xpert Xpress SARS-CoV-2/FLU/RSV plus assay is intended as an aid in the diagnosis of influenza from Nasopharyngeal swab specimens and should not be used as a sole basis for treatment. Nasal washings and aspirates are unacceptable for Xpert Xpress SARS-CoV-2/FLU/RSV testing.  Fact Sheet for Patients: BloggerCourse.comhttps://www.fda.gov/media/152166/download  Fact Sheet for Healthcare Providers: SeriousBroker.ithttps://www.fda.gov/media/152162/download  This test is not yet approved or cleared  by the [place] and has been authorized for detection and/or diagnosis of SARS-CoV-2 by FDA under an Emergency Use Authorization (EUA). This EUA will remain in effect (meaning this test can be used) for the duration of the COVID-19 declaration under Section 564(b)(1) of the Act, 21 U.S.C. section 360bbb-3(b)(1), unless the  authorization is terminated or revoked.  Performed at [facility], [address], [place], [place] [address]      Discharge Instructions:   Discharge Instructions    Diet - low sodium heart healthy   Complete by: As directed    Increase activity slowly   Complete by: As directed      Allergies as of 11/08/2020      Reactions   Latex Other (See Comments), Rash   Compression stockings cause blisters. Bandaids cause red skin Other reaction(s): Other (See Comments) Other Reaction: blisters   Morphine Nausea Only   Other reaction(s): Vomiting   Ciprofloxacin Rash   Oxycodone Nausea Only   Other reaction(s): Unknown   Ketoprofen Nausea Only   Other reaction(s): Unknown   Morphine And Related Other (See Comments)   "crazy" - altered mental status   Amoxicillin-pot Clavulanate Nausea Only, Rash   Shrimp [shellfish Allergy] Rash      Medication List    STOP taking these medications   cephALEXin 500 MG capsule Commonly known as: KEFLEX   desvenlafaxine 50 MG 24 hr tablet Commonly known as: PRISTIQ   potassium chloride SA 20 MEQ tablet Commonly known as: KLOR-CON   sulfamethoxazole-trimethoprim 800-160 MG tablet Commonly known as: BACTRIM DS   Vitamin D3 50 MCG (2000 UT) Tabs     TAKE these medications   aspirin EC 81 MG tablet Take 81 mg by mouth daily.   celecoxib 200 MG capsule Commonly known as: CELEBREX Take 1 capsule (200 mg total) by mouth daily. What changed: when to take this   diclofenac sodium 1 % Gel Commonly known as: VOLTAREN Apply 2 g topically 2 times daily at 12 noon and 4 pm.   DULoxetine 30 MG capsule Commonly known as: CYMBALTA Take 30 mg by mouth daily.   estradiol 1 MG tablet Commonly known as: ESTRACE Take 1 mg by mouth daily.   fluconazole 150 MG tablet Commonly known as: Diflucan Take 1 tablet (150 mg total) by mouth once for 1 dose.   fluticasone 50 MCG/ACT nasal spray Commonly known as: FLONASE Place into  both nostrils daily as needed for allergies or rhinitis.   gabapentin 100 MG capsule Commonly known as: NEURONTIN Take 100 mg by mouth 3 (three) times daily.   levocetirizine 5 MG tablet Commonly known as: XYZAL Take 5 mg by mouth every evening.   levofloxacin 750 MG tablet Commonly known as: LEVAQUIN Take 1 tablet (750 mg total) by mouth daily for 4 days. Start taking on: November 09, 2020   levothyroxine 112 MCG tablet Commonly known as: SYNTHROID Take 112 mcg by mouth daily.   pantoprazole 40 MG tablet Commonly known as: PROTONIX Take 40 mg by mouth daily.   rosuvastatin 20 MG tablet Commonly known as: CRESTOR Take 20 mg by mouth daily.   topiramate 25 MG tablet Commonly known as: TOPAMAX Take 25 mg by mouth 2 (two) times daily.         Time coordinating discharge: 33 minutes  Signed:  Alivea Gladson  [facility] 11/08/2020, 8:43 PM   Pager on [email or web address]. If 7PM-7AM, please contact night-coverage at [email or web address]

## 2020-11-11 LAB — CULTURE, BLOOD (ROUTINE X 2)
Culture: NO GROWTH
Culture: NO GROWTH
Special Requests: ADEQUATE
Special Requests: ADEQUATE

## 2020-12-27 ENCOUNTER — Other Ambulatory Visit: Payer: Self-pay | Admitting: Internal Medicine

## 2020-12-27 ENCOUNTER — Ambulatory Visit
Admission: RE | Admit: 2020-12-27 | Discharge: 2020-12-27 | Disposition: A | Discharge status: Home / Self Care | Payer: Medicare Other | Source: Ambulatory Visit | Attending: Internal Medicine | Admitting: Internal Medicine

## 2020-12-27 ENCOUNTER — Other Ambulatory Visit: Payer: Self-pay

## 2020-12-27 ENCOUNTER — Other Ambulatory Visit (HOSPITAL_COMMUNITY): Payer: Self-pay | Admitting: Internal Medicine

## 2020-12-27 DIAGNOSIS — G510 Bell's palsy: Secondary | ICD-10-CM | POA: Diagnosis present

## 2021-01-10 ENCOUNTER — Other Ambulatory Visit: Payer: Self-pay | Admitting: Internal Medicine

## 2021-01-10 DIAGNOSIS — Z1231 Encounter for screening mammogram for malignant neoplasm of breast: Secondary | ICD-10-CM

## 2021-03-09 ENCOUNTER — Telehealth: Payer: Self-pay | Admitting: Urology

## 2021-03-09 DIAGNOSIS — N10 Acute tubulo-interstitial nephritis: Secondary | ICD-10-CM

## 2021-03-09 NOTE — Telephone Encounter (Signed)
She did not have her follow up RUS from her hospitalization in December.  Would you call her and get her scheduled for a RUS and an appointment to review her results?  Dr. Richardo Hanks saw her in the hospital.

## 2021-03-09 NOTE — Telephone Encounter (Signed)
Dr. Richardo Hanks this was 11/2020 did you still want her to do the renal ultrasound? This was never ordered   -Agree with antibiotics, follow-up blood cultures -No indication for stent placement at this time, consider CT with contrast if not clinically improving after 48 hours of antibiotics to evaluate for abscess or persistent hydronephrosis -We will arrange urology follow-up in 1 month for renal ultrasound to confirm resolution of mild right-sided hydronephrosis  Sondra Come, MD  [facility] [address] [place], [place] [address] [phone]          Electronically signed by Sondra Come, MD at 11/06/2020 5:19 PM

## 2021-03-13 NOTE — Telephone Encounter (Signed)
.  left message to have patient return my call.  

## 2021-03-13 NOTE — Telephone Encounter (Signed)
Spoke with patient and she was so confused about this call and wanting to do imaging, pt is doing well. I scheduled OV to discuss with you.

## 2021-03-13 NOTE — Telephone Encounter (Signed)
Sondra Come, MD  You 3 hours ago (9:19 AM)     Yes, follow-up with me with renal ultrasound prior, thanks

## 2021-03-13 NOTE — Telephone Encounter (Signed)
Yes sorry, appt scheduled with Dr. Richardo Hanks

## 2021-03-16 ENCOUNTER — Other Ambulatory Visit: Payer: Self-pay

## 2021-03-16 ENCOUNTER — Ambulatory Visit
Admission: RE | Admit: 2021-03-16 | Discharge: 2021-03-16 | Disposition: A | Discharge status: Home / Self Care | Payer: Medicare Other | Source: Ambulatory Visit | Attending: Internal Medicine | Admitting: Internal Medicine

## 2021-03-16 DIAGNOSIS — Z1231 Encounter for screening mammogram for malignant neoplasm of breast: Secondary | ICD-10-CM

## 2021-03-21 ENCOUNTER — Ambulatory Visit: Payer: Medicare Other | Admitting: Urology

## 2021-03-21 ENCOUNTER — Other Ambulatory Visit: Payer: Self-pay

## 2021-03-21 ENCOUNTER — Encounter: Payer: Self-pay | Admitting: Urology

## 2021-03-21 ENCOUNTER — Ambulatory Visit (INDEPENDENT_AMBULATORY_CARE_PROVIDER_SITE_OTHER): Payer: Medicare Other | Admitting: Urology

## 2021-03-21 VITALS — BP 147/78 | HR 93 | Ht <= 58 in | Wt 227.0 lb

## 2021-03-21 DIAGNOSIS — N1339 Other hydronephrosis: Secondary | ICD-10-CM

## 2021-03-21 DIAGNOSIS — N390 Urinary tract infection, site not specified: Secondary | ICD-10-CM | POA: Diagnosis not present

## 2021-03-21 NOTE — Progress Notes (Signed)
   03/21/2021 1:30 PM   Janet Galvan 1946/06/02 [id number]  Reason for visit: Follow up pyelonephritis, right-sided hydronephrosis  HPI: I saw [name] in clinic for follow-up of the above issues.  Briefly, she is a 75 year old female who was admitted on 11/06/2020 for pyelonephritis with positive blood cultures.  CT at that time showed mild right-sided hydroureteronephrosis down to the bladder with no evidence of obstructing stone.  She was admitted and treated with culture appropriate antibiotics and her symptoms resolved.  She had a follow-up urinalysis with her PCP on 12/20/2020 that was completely benign, and renal function was stable and essentially normal at 1.0.  I saw her as an inpatient consult, and recommended a repeat renal ultrasound in 1 month to confirm resolution of hydronephrosis, but this was not scheduled.  She denies any gross hematuria, flank pain, or recurrent infections since discharge.  She really denies any complaints today and is doing well.  We discussed the need for a renal ultrasound to confirm resolution of prior hydronephrosis that I suspect was likely secondary to her UTI and pyelonephritis.  She is in agreement with this plan.  Renal ultrasound, will call with results   Sondra Come, MD  [facility] [address] [place], [place] [address] [phone]

## 2021-03-21 NOTE — Patient Instructions (Signed)
Call [phone] to schedule ultrasound.

## 2021-05-25 ENCOUNTER — Ambulatory Visit
Admission: RE | Admit: 2021-05-25 | Discharge: 2021-05-25 | Disposition: A | Discharge status: Home / Self Care | Payer: Medicare Other | Source: Ambulatory Visit | Attending: Urology | Admitting: Urology

## 2021-05-25 ENCOUNTER — Other Ambulatory Visit: Payer: Self-pay

## 2021-05-25 DIAGNOSIS — N10 Acute tubulo-interstitial nephritis: Secondary | ICD-10-CM | POA: Diagnosis not present

## 2021-05-29 ENCOUNTER — Telehealth: Payer: Self-pay

## 2021-05-29 DIAGNOSIS — R1314 Dysphagia, pharyngoesophageal phase: Secondary | ICD-10-CM | POA: Insufficient documentation

## 2021-05-29 NOTE — Telephone Encounter (Signed)
Called pt no answer. LM informing pt of general information. Advised pt call back for questions or concerns.

## 2021-05-29 NOTE — Telephone Encounter (Signed)
-----   Message from Sondra Come, MD sent at 05/29/2021  8:20 AM EDT ----- Good news, renal ultrasound normal, kidneys look good and no evidence of swelling.  Can follow-up with urology as needed  Legrand Rams, MD 05/29/2021

## 2021-06-19 ENCOUNTER — Encounter: Payer: Self-pay | Admitting: Internal Medicine

## 2021-06-20 ENCOUNTER — Encounter
Admission: RE | Disposition: A | Discharge status: Home / Self Care | Payer: Self-pay | Source: Home / Self Care | Attending: Internal Medicine

## 2021-06-20 ENCOUNTER — Ambulatory Visit: Payer: Medicare Other | Admitting: Certified Registered"

## 2021-06-20 ENCOUNTER — Ambulatory Visit
Admission: RE | Admit: 2021-06-20 | Discharge: 2021-06-20 | Disposition: A | Discharge status: Home / Self Care | Payer: Medicare Other | Attending: Internal Medicine | Admitting: Internal Medicine

## 2021-06-20 ENCOUNTER — Encounter: Payer: Self-pay | Admitting: Internal Medicine

## 2021-06-20 DIAGNOSIS — Z86010 Personal history of colonic polyps: Secondary | ICD-10-CM | POA: Diagnosis not present

## 2021-06-20 DIAGNOSIS — Z91040 Latex allergy status: Secondary | ICD-10-CM | POA: Diagnosis not present

## 2021-06-20 DIAGNOSIS — K2100 Gastro-esophageal reflux disease with esophagitis, without bleeding: Secondary | ICD-10-CM | POA: Diagnosis not present

## 2021-06-20 DIAGNOSIS — R1314 Dysphagia, pharyngoesophageal phase: Secondary | ICD-10-CM | POA: Insufficient documentation

## 2021-06-20 DIAGNOSIS — I129 Hypertensive chronic kidney disease with stage 1 through stage 4 chronic kidney disease, or unspecified chronic kidney disease: Secondary | ICD-10-CM | POA: Insufficient documentation

## 2021-06-20 DIAGNOSIS — Z888 Allergy status to other drugs, medicaments and biological substances status: Secondary | ICD-10-CM | POA: Insufficient documentation

## 2021-06-20 DIAGNOSIS — E039 Hypothyroidism, unspecified: Secondary | ICD-10-CM | POA: Insufficient documentation

## 2021-06-20 DIAGNOSIS — I878 Other specified disorders of veins: Secondary | ICD-10-CM | POA: Diagnosis not present

## 2021-06-20 DIAGNOSIS — E669 Obesity, unspecified: Secondary | ICD-10-CM | POA: Insufficient documentation

## 2021-06-20 DIAGNOSIS — Z881 Allergy status to other antibiotic agents status: Secondary | ICD-10-CM | POA: Diagnosis not present

## 2021-06-20 DIAGNOSIS — Z7982 Long term (current) use of aspirin: Secondary | ICD-10-CM | POA: Diagnosis not present

## 2021-06-20 DIAGNOSIS — N189 Chronic kidney disease, unspecified: Secondary | ICD-10-CM | POA: Insufficient documentation

## 2021-06-20 DIAGNOSIS — Z79899 Other long term (current) drug therapy: Secondary | ICD-10-CM | POA: Insufficient documentation

## 2021-06-20 DIAGNOSIS — Z79890 Hormone replacement therapy (postmenopausal): Secondary | ICD-10-CM | POA: Insufficient documentation

## 2021-06-20 DIAGNOSIS — K2970 Gastritis, unspecified, without bleeding: Secondary | ICD-10-CM | POA: Insufficient documentation

## 2021-06-20 DIAGNOSIS — E785 Hyperlipidemia, unspecified: Secondary | ICD-10-CM | POA: Insufficient documentation

## 2021-06-20 DIAGNOSIS — Z885 Allergy status to narcotic agent status: Secondary | ICD-10-CM | POA: Insufficient documentation

## 2021-06-20 DIAGNOSIS — Z880 Allergy status to penicillin: Secondary | ICD-10-CM | POA: Insufficient documentation

## 2021-06-20 HISTORY — DX: Chronic kidney disease, unspecified: N18.9

## 2021-06-20 HISTORY — DX: Depression, unspecified: F32.A

## 2021-06-20 HISTORY — PX: ESOPHAGOGASTRODUODENOSCOPY: SHX5428

## 2021-06-20 SURGERY — EGD (ESOPHAGOGASTRODUODENOSCOPY)
Anesthesia: General

## 2021-06-20 MED ORDER — PROPOFOL 500 MG/50ML IV EMUL
INTRAVENOUS | Status: DC | PRN
  Administered 2021-06-20: 150 ug/kg/min via INTRAVENOUS

## 2021-06-20 MED ORDER — LIDOCAINE HCL (PF) 1 % IJ SOLN
INTRAMUSCULAR | Status: AC
  Filled 2021-06-20: qty 2

## 2021-06-20 MED ORDER — PROPOFOL 10 MG/ML IV BOLUS
INTRAVENOUS | Status: DC | PRN
  Administered 2021-06-20: 70 mg via INTRAVENOUS

## 2021-06-20 MED ORDER — LIDOCAINE HCL (CARDIAC) PF 100 MG/5ML IV SOSY
PREFILLED_SYRINGE | INTRAVENOUS | Status: DC | PRN
  Administered 2021-06-20: 100 mg via INTRAVENOUS

## 2021-06-20 MED ORDER — PROPOFOL 10 MG/ML IV BOLUS
INTRAVENOUS | Status: AC
  Filled 2021-06-20: qty 20

## 2021-06-20 MED ORDER — SODIUM CHLORIDE 0.9 % IV SOLN: INTRAVENOUS | Status: DC

## 2021-06-20 NOTE — Transfer of Care (Signed)
Immediate Anesthesia Transfer of Care Note  Patient: Janet Galvan  Procedure(s) Performed: ESOPHAGOGASTRODUODENOSCOPY (EGD)  Patient Location: PACU  Anesthesia Type:General  Level of Consciousness: awake  Airway & Oxygen Therapy: Patient Spontanous Breathing  Post-op Assessment: Report given to RN and Post -op Vital signs reviewed and stable  Post vital signs: Reviewed and stable  Last Vitals:  Vitals Value Taken Time  BP 131/70 06/20/21 1027  Temp    Pulse    Resp 26 06/20/21 1027  SpO2    Vitals shown include unvalidated device data.  Last Pain:  Vitals:   06/20/21 0934  TempSrc: Temporal  PainSc: 0-No pain         Complications: No notable events documented.

## 2021-06-20 NOTE — Anesthesia Preprocedure Evaluation (Signed)
Anesthesia Evaluation  Patient identified by MRN, date of birth, ID band Patient awake    Reviewed: Allergy & Precautions, H&P , NPO status , Patient's Chart, lab work & pertinent test results  History of Anesthesia Complications Negative for: history of anesthetic complications  Airway Mallampati: III  TM Distance: <3 FB Neck ROM: limited    Dental  (+) Chipped, Poor Dentition   Pulmonary neg pulmonary ROS, neg shortness of breath,    Pulmonary exam normal        Cardiovascular Exercise Tolerance: Good hypertension, (-) angina(-) Past MI and (-) DOE Normal cardiovascular exam     Neuro/Psych PSYCHIATRIC DISORDERS negative neurological ROS     GI/Hepatic Neg liver ROS, hiatal hernia, GERD  Medicated and Controlled,  Endo/Other  Hypothyroidism   Renal/GU Renal disease  negative genitourinary   Musculoskeletal  (+) Arthritis ,   Abdominal   Peds  Hematology negative hematology ROS (+)   Anesthesia Other Findings Past Medical History: No date: Allergic rhinitis No date: GERD (gastroesophageal reflux disease) No date: History of colon polyps No date: Hyperlipidemia No date: Hypertension No date: Hypothyroidism No date: Obesity No date: Osteoarthritis No date: Venous stasis  Past Surgical History: No date: ABDOMINAL HYSTERECTOMY No date: ANKLE ARTHROSCOPY No date: BACK SURGERY No date: carpal tunnell release     Comment:  done endoscopic No date: CHOLECYSTECTOMY No date: EYE SURGERY No date: JOINT REPLACEMENT No date: TUBAL LIGATION  BMI    Body Mass Index:  40.62 kg/m      Reproductive/Obstetrics negative OB ROS                             Anesthesia Physical  Anesthesia Plan  ASA: 3  Anesthesia Plan: General   Post-op Pain Management:    Induction: Intravenous  PONV Risk Score and Plan: Propofol infusion and TIVA  Airway Management Planned: Natural Airway and  Nasal Cannula  Additional Equipment:   Intra-op Plan:   Post-operative Plan:   Informed Consent: I have reviewed the patients History and Physical, chart, labs and discussed the procedure including the risks, benefits and alternatives for the proposed anesthesia with the patient or authorized representative who has indicated his/her understanding and acceptance.     Dental Advisory Given  Plan Discussed with: Anesthesiologist, CRNA and Surgeon  Anesthesia Plan Comments: (Patient consented for risks of anesthesia including but not limited to:  - adverse reactions to medications - risk of intubation if required - damage to teeth, lips or other oral mucosa - sore throat or hoarseness - Damage to heart, brain, lungs or loss of life  Patient voiced understanding.)        Anesthesia Quick Evaluation

## 2021-06-20 NOTE — Anesthesia Postprocedure Evaluation (Signed)
Anesthesia Post Note  Patient: Janet Galvan  Procedure(s) Performed: ESOPHAGOGASTRODUODENOSCOPY (EGD)  Patient location during evaluation: Endoscopy Anesthesia Type: General Level of consciousness: awake and alert Pain management: pain level controlled Vital Signs Assessment: post-procedure vital signs reviewed and stable Respiratory status: spontaneous breathing, nonlabored ventilation, respiratory function stable and patient connected to nasal cannula oxygen Cardiovascular status: blood pressure returned to baseline and stable Postop Assessment: no apparent nausea or vomiting Anesthetic complications: no   No notable events documented.   Last Vitals:  Vitals:   06/20/21 1047 06/20/21 1057  BP: 125/63 (!) 128/56  Pulse: 66 62  Resp: 13 11  Temp:    SpO2: 100% 100%    Last Pain:  Vitals:   06/20/21 1057  TempSrc:   PainSc: 2                  Cleda Mccreedy Raylin Diguglielmo

## 2021-06-20 NOTE — H&P (Signed)
Outpatient short stay form Pre-procedure 06/20/2021 8:43 AM Gor Vestal K. Norma Fredrickson, M.D.  Primary Physician: Aram Beecham, M.D.  Reason for visit:  Pharyngoesophageal dysphagia, GERD  History of present illness:  75 y/o female recently with intermittent solid food dysphagia and hx of GERD on PPI. No abdominal pain, melena.   No current facility-administered medications for this encounter.  Medications Prior to Admission  Medication Sig Dispense Refill Last Dose   acetaminophen (TYLENOL) 650 MG CR tablet Take by mouth.      aspirin EC 81 MG tablet Take 81 mg by mouth daily.      diclofenac sodium (VOLTAREN) 1 % GEL Apply 2 g topically 2 times daily at 12 noon and 4 pm.      DULoxetine (CYMBALTA) 30 MG capsule Take 30 mg by mouth daily.      estradiol (ESTRACE) 1 MG tablet Take 1 mg by mouth daily.      fluticasone (FLONASE) 50 MCG/ACT nasal spray Place into both nostrils daily as needed for allergies or rhinitis.      gabapentin (NEURONTIN) 100 MG capsule Take 100 mg by mouth 3 (three) times daily.      levocetirizine (XYZAL) 5 MG tablet Take 5 mg by mouth every evening.      levothyroxine (SYNTHROID) 100 MCG tablet Take 100 mcg by mouth daily.      levothyroxine (SYNTHROID) 112 MCG tablet Take 112 mcg by mouth daily.       mometasone (ELOCON) 0.1 % ointment Apply topically.      nabumetone (RELAFEN) 500 MG tablet Take by mouth.      pantoprazole (PROTONIX) 40 MG tablet Take 40 mg by mouth daily.      rosuvastatin (CRESTOR) 20 MG tablet Take 20 mg by mouth daily.      traMADol (ULTRAM) 50 MG tablet take1-2  tablet(s) by oral route  every 6 hours as needed for pain.      venlafaxine XR (EFFEXOR-XR) 75 MG 24 hr capsule Take 75 mg by mouth daily.        Allergies  Allergen Reactions   Latex Other (See Comments) and Rash    Compression stockings cause blisters. Bandaids cause red skin Other reaction(s): Other (See Comments) Other Reaction: blisters   Morphine Nausea Only    Other  reaction(s): Vomiting   Ciprofloxacin Rash   Oxycodone Nausea Only    Other reaction(s): Unknown   Amoxicillin    Clavulanic Acid    Ketoprofen Nausea Only    Other reaction(s): Unknown   Morphine And Related Other (See Comments)    "crazy" - altered mental status   Amoxicillin-Pot Clavulanate Nausea Only and Rash   Shrimp [Shellfish Allergy] Rash     Past Medical History:  Diagnosis Date   Allergic rhinitis    Chronic kidney disease    Depression    GERD (gastroesophageal reflux disease)    History of colon polyps    History of hiatal hernia    Hyperlipidemia    Hypertension    Hypothyroidism    Lymphedema of both lower extremities    Obesity    Osteoarthritis    Venous stasis     Review of systems:  Otherwise negative.    Physical Exam  Gen: Alert, oriented. Appears stated age.  HEENT: [place]/AT. PERRLA. Lungs: CTA, no wheezes. CV: RR nl S1, S2. Abd: soft, benign, no masses. BS+ Ext: No edema. Pulses 2+    Planned procedures: Proceed with EGD. The patient understands the nature of the  planned procedure, indications, risks, alternatives and potential complications including but not limited to bleeding, infection, perforation, damage to internal organs and possible oversedation/side effects from anesthesia. The patient agrees and gives consent to proceed.  Please refer to procedure notes for findings, recommendations and patient disposition/instructions.     Demetrias Goodbar K. Norma Fredrickson, M.D. Gastroenterology 06/20/2021  8:43 AM

## 2021-06-20 NOTE — Interval H&P Note (Signed)
History and Physical Interval Note:  06/20/2021 8:44 AM  Janet Galvan  has presented today for surgery, with the diagnosis of GERD PHARYNGOESOPHAGEAL DYSPHAGIA.  The various methods of treatment have been discussed with the patient and family. After consideration of risks, benefits and other options for treatment, the patient has consented to  Procedure(s): ESOPHAGOGASTRODUODENOSCOPY (EGD) (N/A) as a surgical intervention.  The patient's history has been reviewed, patient examined, no change in status, stable for surgery.  I have reviewed the patient's chart and labs.  Questions were answered to the patient's satisfaction.     [facility], [place]

## 2021-06-20 NOTE — Op Note (Signed)
[facility] Gastroenterology Patient Name: Janet Galvan Procedure Date: 06/20/2021 10:11 AM MRN: [id number] Account #: [id number] Date of Birth: May 21, 1946 Admit Type: Outpatient Age: 75 Room: [facility] ENDO ROOM 2 Gender: Female Note Status: Finalized Procedure:             Upper GI endoscopy Indications:           Esophageal dysphagia, Gastro-esophageal reflux disease Providers:             Boykin Nearing. Norma Fredrickson MD, MD Referring MD:          Duane Lope. Judithann Sheen, MD (Referring MD) Medicines:             Propofol per Anesthesia Complications:         No immediate complications. Procedure:             Pre-Anesthesia Assessment:                        - The risks and benefits of the procedure and the                         sedation options and risks were discussed with the                         patient. All questions were answered and informed                         consent was obtained.                        - Patient identification and proposed procedure were                         verified prior to the procedure by the nurse. The                         procedure was verified in the procedure room.                        - ASA Grade Assessment: III - A patient with severe                         systemic disease.                        - After reviewing the risks and benefits, the patient                         was deemed in satisfactory condition to undergo the                         procedure.                        After obtaining informed consent, the endoscope was                         passed under direct vision. Throughout the procedure,                         the patient's  blood pressure, pulse, and oxygen                         saturations were monitored continuously. The Endoscope                         was introduced through the mouth, and advanced to the                         third part of duodenum. The upper GI endoscopy was                          accomplished without difficulty. The patient tolerated                         the procedure well. Findings:      No endoscopic abnormality was evident in the esophagus to explain the       patient's complaint of dysphagia. It was decided, however, to proceed       with dilation in the distal esophagus. The scope was withdrawn. Dilation       was performed with a Maloney dilator with no resistance at 54 Fr.      Patchy minimal inflammation characterized by erythema was found in the       gastric antrum.      The examined duodenum was normal. Impression:            - No endoscopic esophageal abnormality to explain                         patient's dysphagia. Esophagus dilated. Dilated.                        - Gastritis.                        - Normal examined duodenum.                        - No specimens collected. Recommendation:        - Patient has a contact number available for                         emergencies. The signs and symptoms of potential                         delayed complications were discussed with the patient.                         Return to normal activities tomorrow. Written                         discharge instructions were provided to the patient.                        - Resume previous diet.                        - Continue present medications.                        -  Return to GI office in 6 weeks.                        - The findings and recommendations were discussed with                         the patient.                        - Monitor results to esophageal dilation Procedure Code(s):     --- Professional ---                        [phone], Esophagogastroduodenoscopy, flexible,                         transoral; diagnostic, including collection of                         specimen(s) by brushing or washing, when performed                         (separate procedure)                        43450, Dilation of esophagus, by unguided sound or                          bougie, single or multiple passes Diagnosis Code(s):     --- Professional ---                        K21.9, Gastro-esophageal reflux disease without                         esophagitis                        R13.14, Dysphagia, pharyngoesophageal phase                        K29.70, Gastritis, unspecified, without bleeding CPT copyright 2019 American Medical Association. All rights reserved. The codes documented in this report are preliminary and upon coder review may  be revised to meet current compliance requirements. [name] MD, MD 06/20/2021 10:24:57 AM This report has been signed electronically. Number of Addenda: 0 Note Initiated On: 06/20/2021 10:11 AM Estimated Blood Loss:  Estimated blood loss: none.      [facility]

## 2021-06-21 ENCOUNTER — Encounter: Payer: Self-pay | Admitting: Internal Medicine

## 2021-09-19 ENCOUNTER — Other Ambulatory Visit: Payer: Self-pay | Admitting: Surgery

## 2021-09-19 DIAGNOSIS — M25561 Pain in right knee: Secondary | ICD-10-CM

## 2021-09-19 DIAGNOSIS — M1711 Unilateral primary osteoarthritis, right knee: Secondary | ICD-10-CM

## 2021-10-04 ENCOUNTER — Other Ambulatory Visit: Payer: Self-pay

## 2021-10-04 ENCOUNTER — Ambulatory Visit
Admission: RE | Admit: 2021-10-04 | Discharge: 2021-10-04 | Disposition: A | Discharge status: Home / Self Care | Payer: Medicare Other | Source: Ambulatory Visit | Attending: Surgery | Admitting: Surgery

## 2021-10-04 DIAGNOSIS — M25561 Pain in right knee: Secondary | ICD-10-CM | POA: Insufficient documentation

## 2021-10-04 DIAGNOSIS — M1711 Unilateral primary osteoarthritis, right knee: Secondary | ICD-10-CM | POA: Diagnosis present

## 2021-11-09 ENCOUNTER — Other Ambulatory Visit: Payer: Self-pay | Admitting: Surgery

## 2021-11-20 ENCOUNTER — Other Ambulatory Visit: Payer: Self-pay

## 2021-11-20 ENCOUNTER — Encounter
Admission: RE | Admit: 2021-11-20 | Discharge: 2021-11-20 | Disposition: A | Discharge status: Home / Self Care | Payer: Medicare Other | Source: Ambulatory Visit | Attending: Surgery | Admitting: Surgery

## 2021-11-20 VITALS — BP 141/67 | HR 78 | Temp 98.0°F | Resp 16 | Ht 59.0 in | Wt 223.2 lb

## 2021-11-20 DIAGNOSIS — Z01818 Encounter for other preprocedural examination: Secondary | ICD-10-CM | POA: Diagnosis present

## 2021-11-20 DIAGNOSIS — R8290 Unspecified abnormal findings in urine: Secondary | ICD-10-CM

## 2021-11-20 DIAGNOSIS — Z01812 Encounter for preprocedural laboratory examination: Secondary | ICD-10-CM

## 2021-11-20 LAB — COMPREHENSIVE METABOLIC PANEL
ALT: 10 U/L (ref 0–44)
AST: 18 U/L (ref 15–41)
Albumin: 4.1 g/dL (ref 3.5–5.0)
Alkaline Phosphatase: 72 U/L (ref 38–126)
Anion gap: 7 (ref 5–15)
BUN: 18 mg/dL (ref 8–23)
CO2: 23 mmol/L (ref 22–32)
Calcium: 9.1 mg/dL (ref 8.9–10.3)
Chloride: 111 mmol/L (ref 98–111)
Creatinine, Ser: 0.78 mg/dL (ref 0.44–1.00)
GFR, Estimated: >60 mL/min (ref >60)
Glucose, Bld: 102 mg/dL — ABNORMAL HIGH (ref 70–99)
Potassium: 3.8 mmol/L (ref 3.5–5.1)
Sodium: 141 mmol/L (ref 135–145)
Total Bilirubin: 0.8 mg/dL (ref 0.3–1.2)
Total Protein: 7.3 g/dL (ref 6.5–8.1)

## 2021-11-20 LAB — CBC WITH DIFFERENTIAL/PLATELET
Abs Immature Granulocytes: 0.02 10*3/uL (ref 0.00–0.07)
Basophils Absolute: 0.1 10*3/uL (ref 0.0–0.1)
Basophils Relative: 1 %
Eosinophils Absolute: 0.3 10*3/uL (ref 0.0–0.5)
Eosinophils Relative: 5 %
HCT: 38.4 % (ref 36.0–46.0)
Hemoglobin: 13 g/dL (ref 12.0–15.0)
Immature Granulocytes: 0 %
Lymphocytes Relative: 29 %
Lymphs Abs: 1.4 10*3/uL (ref 0.7–4.0)
MCH: 30.4 pg (ref 26.0–34.0)
MCHC: 33.9 g/dL (ref 30.0–36.0)
MCV: 89.9 fL (ref 80.0–100.0)
Monocytes Absolute: 0.4 10*3/uL (ref 0.1–1.0)
Monocytes Relative: 7 %
Neutro Abs: 2.8 10*3/uL (ref 1.7–7.7)
Neutrophils Relative %: 58 %
Platelets: 203 10*3/uL (ref 150–400)
RBC: 4.27 MIL/uL (ref 3.87–5.11)
RDW: 12.7 % (ref 11.5–15.5)
WBC: 4.9 10*3/uL (ref 4.0–10.5)
nRBC: 0 % (ref 0.0–0.2)

## 2021-11-20 LAB — URINALYSIS, ROUTINE W REFLEX MICROSCOPIC
Bilirubin Urine: NEGATIVE
Glucose, UA: NEGATIVE mg/dL
Hgb urine dipstick: NEGATIVE
Ketones, ur: NEGATIVE mg/dL
Nitrite: POSITIVE — AB
Protein, ur: NEGATIVE mg/dL
Specific Gravity, Urine: 1.02 (ref 1.005–1.030)
pH: 5 (ref 5.0–8.0)

## 2021-11-20 LAB — SURGICAL PCR SCREEN
MRSA, PCR: NEGATIVE
Staphylococcus aureus: NEGATIVE

## 2021-11-20 LAB — TYPE AND SCREEN
ABO/RH(D): O POS
Antibody Screen: NEGATIVE

## 2021-11-20 NOTE — Patient Instructions (Addendum)
Your procedure is scheduled on: Thursday November 29, 2021. Report to Day Surgery inside [facility] 2nd floor.  To find out your arrival time please call [phone] between 1PM - 3PM on Wednesday November 28, 2021.  Remember: Instructions that are not followed completely may result in serious medical risk,  up to and including death, or upon the discretion of your surgeon and anesthesiologist your  surgery may need to be rescheduled.     _X__ 1. Do not eat food after midnight the night before your procedure.                 No chewing gum or hard candies. You may drink clear liquids up to 2 hours                 before you are scheduled to arrive for your surgery- DO not drink clear                 liquids within 2 hours of the start of your surgery.                 Clear Liquids include:  water, apple juice without pulp, clear Gatorade, G2 or                  Gatorade Zero (avoid Red/Purple/Blue), Black Coffee or Tea (Do not add                 anything to coffee or tea).  __X__2.   Complete the "Ensure Clear Pre-surgery Clear Carbohydrate Drink" provided to you, 2 hours before arrival. **If you are diabetic you will be provided with an alternative drink, Gatorade Zero or G2.  __X__3.  On the morning of surgery brush your teeth with toothpaste and water, you                may rinse your mouth with mouthwash if you wish.  Do not swallow any toothpaste of mouthwash.     _X__ 4.  No Alcohol for 24 hours before or after surgery.   _X__ 5.  Do Not Smoke or use e-cigarettes For 24 Hours Prior to Your Surgery.                 Do not use any chewable tobacco products for at least 6 hours prior to                 Surgery.  _X__  6.  Do not use any recreational drugs (marijuana, cocaine, heroin, ecstasy, MDMA or other)                For at least one week prior to your surgery.  Combination of these drugs with anesthesia                May have life threatening  results.  __X__  7.  Notify your doctor if there is any change in your medical condition      (cold, fever, infections).     Do not wear jewelry, make-up, hairpins, clips or nail polish. Do not wear lotions, powders, or perfumes. You may wear deodorant. Do not shave 48 hours prior to surgery. Men may shave face and neck. Do not bring valuables to the hospital.    [facility] is not responsible for any belongings or valuables.  Contacts, dentures or bridgework may not be worn into surgery. Leave your suitcase in the car. After surgery it may be brought to your room.  For patients admitted to the hospital, discharge time is determined by your treatment team.   Patients discharged the day of surgery will not be allowed to drive home.   Make arrangements for someone to be with you for the first 24 hours of your Same Day Discharge.   __X__ Take these medicines the morning of surgery with A SIP OF WATER:    1. DULoxetine (CYMBALTA) 30 MG  2. levothyroxine (SYNTHROID) 100 MCG   3. pantoprazole (PROTONIX) 40 MG  4. rosuvastatin (CRESTOR) 20 MG  5.   6.  ____ Fleet Enema (as directed)   __X__ Use CHG Soap (or wipes) as directed  ____ Use Benzoyl Peroxide Gel as instructed  ____ Use inhalers on the day of surgery  ____ Stop metformin 2 days prior to surgery    ____ Take 1/2 of usual insulin dose the night before surgery. No insulin the morning          of surgery.   __X__ Stop aspirin 81 mg as instructed by your doctor.  __X__ One Week prior to surgery- Stop Anti-inflammatories such as Ibuprofen, Aleve, Advil, Motrin, meloxicam (MOBIC), diclofenac, etodolac, ketorolac, Toradol, Daypro, piroxicam, Goody's or BC powders. OK TO USE TYLENOL IF NEEDED   __X__ One week before your surgery stop ALL supplements until after surgery.    ____ Bring C-Pap to the hospital.    If you have any questions regarding your pre-procedure instructions,  Please call Pre-admit Testing at  [phone]

## 2021-11-22 ENCOUNTER — Telehealth: Payer: Self-pay | Admitting: Urgent Care

## 2021-11-22 DIAGNOSIS — N390 Urinary tract infection, site not specified: Secondary | ICD-10-CM

## 2021-11-22 DIAGNOSIS — Z01812 Encounter for preprocedural laboratory examination: Secondary | ICD-10-CM

## 2021-11-22 LAB — URINE CULTURE: Culture: >=100000 — AB

## 2021-11-22 MED ORDER — SULFAMETHOXAZOLE-TRIMETHOPRIM 800-160 MG PO TABS: 1.0000 | ORAL_TABLET | Freq: Two times a day (BID) | ORAL | 0 refills | Status: DC

## 2021-11-22 NOTE — Progress Notes (Addendum)
°  [facility] Perioperative Services: Pre-Admission/Anesthesia Testing  Abnormal Lab Notification and Treatment Plan of Care   Date: 11/22/21  Name: Janet Galvan MRN:   [id number]  Re: Abnormal labs noted during PAT appointment   Notified:  Provider Name Provider Role Notification Mode  Poggi, Jonny Ruiz, MD Orthopedic Surgeon Routed and/or faxed via [facility]   ABNORMAL LAB VALUE(S): Lab Results  Component Value Date   COLORURINE YELLOW (A) 11/20/2021   APPEARANCEUR HAZY (A) 11/20/2021   LABSPEC 1.020 11/20/2021   PHURINE 5.0 11/20/2021   GLUCOSEU NEGATIVE 11/20/2021   HGBUR NEGATIVE 11/20/2021   BILIRUBINUR NEGATIVE 11/20/2021   KETONESUR NEGATIVE 11/20/2021   PROTEINUR NEGATIVE 11/20/2021   NITRITE POSITIVE (A) 11/20/2021   LEUKOCYTESUR TRACE (A) 11/20/2021   EPIU 0-5 11/20/2021   WBCU 21-50 11/20/2021   RBCU 0-5 11/20/2021   BACTERIA MANY (A) 11/20/2021   CULT >=100,000 COLONIES/mL ESCHERICHIA COLI (A) 11/20/2021   Clinical Information and Notes:  Patient is scheduled for an elective total knee arthroplasty on 11/29/2021  UA performed in PAT consistent with/concerning for infection.  No leukocytosis noted on CBC; WBC 4900 Renal function: Estimated Creatinine Clearance: 64.7 mL/min (by C-G formula based on SCr of 0.78 mg/dL). Urine C&S added to assess for pathogenically significant growth.  Impression and Plan:  Janet Galvan with a UA that was (+) for infection; reflex culture sent. Contacted patient to discuss. Patient experiencing worsening lower back pain with associated dysuria over the course of the last several days. PMH significant for recurrent UTI's. Patient with surgery scheduled soon. In efforts to avoid delaying patient's procedure, or have her experience any potentially significant perioperative complications related to the aforementioned, I would like to proceed with empiric treatment for urinary tract infection.  Allergies reviewed.  Culture report also reviewed to ensure culture appropriate coverage is being provided. Efforts made to avoid flouroquinolones due to associated side effect profile. Given her age, will treat with first line therapy using a 5 day course of SMZ-TMP DS. Patient encouraged to complete the entire course of antibiotics even if she begins to feel better.   Meds ordered this encounter  Medications   sulfamethoxazole-trimethoprim (BACTRIM DS) 800-160 MG tablet    Sig: Take 1 tablet by mouth 2 (two) times daily for 5 days. Increase WATER intake while taking this medication.    Dispense:  10 tablet    Refill:  0   Patient encouraged to increase her fluid intake as much as possible. Discussed that water is always best to flush the urinary tract. She was advised to avoid caffeine containing fluids until her infections clears, as caffeine can cause her to experience painful bladder spasms.   May use Tylenol as needed for pain/fever should she experience these symptoms.  Patient instructed to call surgeon's office or PAT with any questions or concerns related to the above outlined course of treatment. Additionally, she was instructed to call if she feels like she is getting worse overall while on treatment. Results and treatment plan of care forwarded to primary attending surgeon to make them aware.   This is a Personal assistant; no formal response is required.  Quentin Mulling, MSN, APRN, FNP-C, CEN [facility]  Peri-operative Services Nurse Practitioner Phone: [phone] Fax: [phone] 11/22/21 8:18 AM

## 2021-11-27 ENCOUNTER — Encounter: Payer: Self-pay | Admitting: Urgent Care

## 2021-11-27 ENCOUNTER — Other Ambulatory Visit: Payer: Self-pay

## 2021-11-27 ENCOUNTER — Other Ambulatory Visit
Admission: RE | Admit: 2021-11-27 | Discharge: 2021-11-27 | Disposition: A | Discharge status: Home / Self Care | Payer: Medicare Other | Source: Ambulatory Visit | Attending: Surgery | Admitting: Surgery

## 2021-11-27 DIAGNOSIS — N390 Urinary tract infection, site not specified: Secondary | ICD-10-CM | POA: Insufficient documentation

## 2021-11-27 DIAGNOSIS — Z20822 Contact with and (suspected) exposure to covid-19: Secondary | ICD-10-CM | POA: Diagnosis not present

## 2021-11-27 LAB — URINALYSIS, COMPLETE (UACMP) WITH MICROSCOPIC
Bilirubin Urine: NEGATIVE
Glucose, UA: NEGATIVE mg/dL
Hgb urine dipstick: NEGATIVE
Ketones, ur: NEGATIVE mg/dL
Leukocytes,Ua: NEGATIVE
Nitrite: NEGATIVE
Protein, ur: NEGATIVE mg/dL
Specific Gravity, Urine: 1.025 (ref 1.005–1.030)
pH: 5.5 (ref 5.0–8.0)

## 2021-11-27 LAB — SARS CORONAVIRUS 2 (TAT 6-24 HRS): SARS Coronavirus 2: NEGATIVE

## 2021-11-27 NOTE — Progress Notes (Signed)
°  Perioperative Services Pre-Admission/Anesthesia Testing    Date: 11/27/21  Name: Janet Galvan MRN:   [id number]  Re: Reaction to ABX  Clinical Notes:  Patient is scheduled to undergo total knee arthroplasty on 11/29/2021 with Dr. Leron Croak, MD. preoperative testing revealed and confirmed urinary tract infection.  She was started on SMZ-TMP DS by PAT APP in efforts to treat infection.  Of note, primary attending surgeon also sought results and started patient on ciprofloxacin.  Patient advising that she started to take the prescribed SMZ-TMP DS and developed a minor nonspecific rash; took 2 doses.  Medication was discontinued and patient started taking the ciprofloxacin that was prescribed by her surgeon.  After taking a total of 4 doses of the ciprofloxacin, patient reporting that she developed a "blistering rash" to her upper thighs.  Medication was discontinued, and patient started to take diphenhydramine.  She reports that she checked with a pharmacist friend and it was determined that patient had a documented allergy to ciprofloxacin in the past.  In review of her EMR, ciprofloxacin is listed on her allergy list. SMZ-TMP was added today, and reaction to ciprofloxacin was updated.   Patient presented to the PAT clinic on the morning of 11/27/2021 for SARS-CoV-2 testing.  UA repeated following a total of 3 days of antimicrobial therapy.  Repeat UA showed solution of noted infection.  Impression and Plan:  Janet Galvan with reaction to 2 different antibiotics being used to treat UTI, both of which have been discontinued at this point.  She is taking diphenhydramine as needed to mitigate associated symptoms.  UA repeated demonstrating marked improvement/resolution of infection.  Patient took a total of 2 doses of SMZ-TMP DS (1 day) and 4 doses of ciprofloxacin (2 days) for a total of 3 days of antimicrobial therapy. After review of repeat UA it was determined that no further treatment  was necessary at this time. Will update surgeon.   Quentin Mulling, MSN, APRN, FNP-C, CEN [facility]  Peri-operative Services Nurse Practitioner Phone: [phone] 11/27/21 10:30 AM  NOTE: This note has been prepared using Dragon dictation software. Despite my best ability to proofread, there is always the potential that unintentional transcriptional errors may still occur from this process.

## 2021-11-29 ENCOUNTER — Observation Stay: Payer: Medicare Other

## 2021-11-29 ENCOUNTER — Other Ambulatory Visit: Payer: Self-pay

## 2021-11-29 ENCOUNTER — Encounter: Payer: Self-pay | Admitting: Surgery

## 2021-11-29 ENCOUNTER — Ambulatory Visit: Payer: Medicare Other | Admitting: Urgent Care

## 2021-11-29 ENCOUNTER — Observation Stay
Admission: RE | Admit: 2021-11-29 | Discharge: 2021-12-01 | Disposition: A | Discharge status: Home / Self Care | Payer: Medicare Other | Attending: Surgery | Admitting: Surgery

## 2021-11-29 ENCOUNTER — Encounter
Admission: RE | Disposition: A | Discharge status: Home / Self Care | Payer: Self-pay | Source: Home / Self Care | Attending: Surgery

## 2021-11-29 DIAGNOSIS — E039 Hypothyroidism, unspecified: Secondary | ICD-10-CM | POA: Diagnosis not present

## 2021-11-29 DIAGNOSIS — Z880 Allergy status to penicillin: Secondary | ICD-10-CM | POA: Diagnosis not present

## 2021-11-29 DIAGNOSIS — N1831 Chronic kidney disease, stage 3a: Secondary | ICD-10-CM | POA: Diagnosis not present

## 2021-11-29 DIAGNOSIS — Z91040 Latex allergy status: Secondary | ICD-10-CM | POA: Diagnosis not present

## 2021-11-29 DIAGNOSIS — Z79890 Hormone replacement therapy (postmenopausal): Secondary | ICD-10-CM | POA: Insufficient documentation

## 2021-11-29 DIAGNOSIS — Z6841 Body Mass Index (BMI) 40.0 and over, adult: Secondary | ICD-10-CM | POA: Insufficient documentation

## 2021-11-29 DIAGNOSIS — Z96651 Presence of right artificial knee joint: Secondary | ICD-10-CM

## 2021-11-29 DIAGNOSIS — E669 Obesity, unspecified: Secondary | ICD-10-CM | POA: Diagnosis not present

## 2021-11-29 DIAGNOSIS — I129 Hypertensive chronic kidney disease with stage 1 through stage 4 chronic kidney disease, or unspecified chronic kidney disease: Secondary | ICD-10-CM | POA: Diagnosis not present

## 2021-11-29 DIAGNOSIS — M1711 Unilateral primary osteoarthritis, right knee: Principal | ICD-10-CM | POA: Insufficient documentation

## 2021-11-29 DIAGNOSIS — Z79899 Other long term (current) drug therapy: Secondary | ICD-10-CM | POA: Diagnosis not present

## 2021-11-29 DIAGNOSIS — Z886 Allergy status to analgesic agent status: Secondary | ICD-10-CM | POA: Insufficient documentation

## 2021-11-29 HISTORY — PX: TOTAL KNEE ARTHROPLASTY: SHX125

## 2021-11-29 LAB — ABO/RH: ABO/RH(D): O POS

## 2021-11-29 SURGERY — ARTHROPLASTY, KNEE, TOTAL
Anesthesia: Spinal | Site: Knee | Laterality: Right

## 2021-11-29 MED ORDER — SODIUM CHLORIDE 0.9 % IV SOLN: INTRAVENOUS | Status: DC

## 2021-11-29 MED ORDER — KETOROLAC TROMETHAMINE 15 MG/ML IJ SOLN
15.0000 mg | Freq: Once | INTRAMUSCULAR | Status: AC
  Administered 2021-11-29: 12:00:00 15 mg via INTRAVENOUS

## 2021-11-29 MED ORDER — ACETAMINOPHEN 10 MG/ML IV SOLN
INTRAVENOUS | Status: DC | PRN
  Administered 2021-11-29: 1000 mg via INTRAVENOUS

## 2021-11-29 MED ORDER — 0.9 % SODIUM CHLORIDE (POUR BTL) OPTIME
TOPICAL | Status: DC | PRN
  Administered 2021-11-29: 09:00:00 500 mL

## 2021-11-29 MED ORDER — KETOROLAC TROMETHAMINE 15 MG/ML IJ SOLN
7.5000 mg | Freq: Four times a day (QID) | INTRAMUSCULAR | Status: AC
  Administered 2021-11-29 – 2021-11-30 (×4): 7.5 mg via INTRAVENOUS
  Filled 2021-11-29 (×4): qty 1

## 2021-11-29 MED ORDER — DIPHENHYDRAMINE HCL 12.5 MG/5ML PO ELIX
12.5000 mg | ORAL_SOLUTION | ORAL | Status: DC | PRN
  Filled 2021-11-29: qty 10

## 2021-11-29 MED ORDER — EPHEDRINE SULFATE 50 MG/ML IJ SOLN
INTRAMUSCULAR | Status: DC | PRN
  Administered 2021-11-29 (×2): 5 mg via INTRAVENOUS

## 2021-11-29 MED ORDER — CEFAZOLIN SODIUM-DEXTROSE 1-4 GM/50ML-% IV SOLN
INTRAVENOUS | Status: AC
  Filled 2021-11-29: qty 50

## 2021-11-29 MED ORDER — HYDROMORPHONE HCL 1 MG/ML IJ SOLN: 0.2500 mg | INTRAMUSCULAR | Status: DC | PRN

## 2021-11-29 MED ORDER — MEPERIDINE HCL 25 MG/ML IJ SOLN: 6.2500 mg | INTRAMUSCULAR | Status: DC | PRN

## 2021-11-29 MED ORDER — PROPOFOL 1000 MG/100ML IV EMUL
INTRAVENOUS | Status: AC
  Filled 2021-11-29: qty 100

## 2021-11-29 MED ORDER — BUPIVACAINE LIPOSOME 1.3 % IJ SUSP
INTRAMUSCULAR | Status: AC
  Filled 2021-11-29: qty 20

## 2021-11-29 MED ORDER — ONDANSETRON HCL 4 MG/2ML IJ SOLN
4.0000 mg | Freq: Four times a day (QID) | INTRAMUSCULAR | Status: DC | PRN
  Administered 2021-11-29: 16:00:00 4 mg via INTRAVENOUS
  Filled 2021-11-29: qty 2

## 2021-11-29 MED ORDER — CHLORHEXIDINE GLUCONATE 0.12 % MT SOLN
OROMUCOSAL | Status: AC
  Administered 2021-11-29: 07:00:00 15 mL via OROMUCOSAL
  Filled 2021-11-29: qty 15

## 2021-11-29 MED ORDER — PANTOPRAZOLE SODIUM 40 MG PO TBEC
40.0000 mg | DELAYED_RELEASE_TABLET | Freq: Every day | ORAL | Status: DC
  Administered 2021-11-30: 22:00:00 40 mg via ORAL
  Filled 2021-11-29: qty 1

## 2021-11-29 MED ORDER — CEFAZOLIN IN SODIUM CHLORIDE 3-0.9 GM/100ML-% IV SOLN
3.0000 g | Freq: Four times a day (QID) | INTRAVENOUS | Status: AC
  Administered 2021-11-29 – 2021-11-30 (×3): 3 g via INTRAVENOUS
  Filled 2021-11-29 (×3): qty 100

## 2021-11-29 MED ORDER — SODIUM CHLORIDE 0.9 % IR SOLN
Status: DC | PRN
  Administered 2021-11-29: 3000 mL

## 2021-11-29 MED ORDER — DIPHENHYDRAMINE HCL 50 MG/ML IJ SOLN
INTRAMUSCULAR | Status: DC | PRN
  Administered 2021-11-29: 6.25 mg via INTRAVENOUS

## 2021-11-29 MED ORDER — GABAPENTIN 300 MG PO CAPS
300.0000 mg | ORAL_CAPSULE | Freq: Every day | ORAL | Status: DC
  Administered 2021-11-29 – 2021-11-30 (×2): 300 mg via ORAL
  Filled 2021-11-29 (×2): qty 1

## 2021-11-29 MED ORDER — ORAL CARE MOUTH RINSE: 15.0000 mL | Freq: Once | OROMUCOSAL | Status: AC

## 2021-11-29 MED ORDER — FENTANYL CITRATE (PF) 100 MCG/2ML IJ SOLN
25.0000 ug | INTRAMUSCULAR | Status: DC | PRN
  Administered 2021-11-29: 12:00:00 50 ug via INTRAVENOUS

## 2021-11-29 MED ORDER — ROSUVASTATIN CALCIUM 20 MG PO TABS
20.0000 mg | ORAL_TABLET | Freq: Every day | ORAL | Status: DC
  Administered 2021-11-30 – 2021-12-01 (×2): 20 mg via ORAL
  Filled 2021-11-29 (×2): qty 1

## 2021-11-29 MED ORDER — DOCUSATE SODIUM 100 MG PO CAPS
100.0000 mg | ORAL_CAPSULE | Freq: Two times a day (BID) | ORAL | Status: DC
  Administered 2021-11-29 – 2021-12-01 (×5): 100 mg via ORAL
  Filled 2021-11-29 (×5): qty 1

## 2021-11-29 MED ORDER — TRAMADOL HCL 50 MG PO TABS
50.0000 mg | ORAL_TABLET | Freq: Four times a day (QID) | ORAL | Status: DC | PRN
  Administered 2021-11-29 – 2021-11-30 (×2): 50 mg via ORAL
  Filled 2021-11-29 (×2): qty 1

## 2021-11-29 MED ORDER — SODIUM CHLORIDE FLUSH 0.9 % IV SOLN
INTRAVENOUS | Status: AC
  Filled 2021-11-29: qty 40

## 2021-11-29 MED ORDER — FENTANYL CITRATE (PF) 100 MCG/2ML IJ SOLN
INTRAMUSCULAR | Status: AC
  Administered 2021-11-29: 12:00:00 50 ug via INTRAVENOUS
  Filled 2021-11-29: qty 2

## 2021-11-29 MED ORDER — CHLORHEXIDINE GLUCONATE 0.12 % MT SOLN: 15.0000 mL | Freq: Once | OROMUCOSAL | Status: AC

## 2021-11-29 MED ORDER — CRANBERRY-VITAMIN C 450-125 MG PO CAPS: ORAL_CAPSULE | Freq: Every day | ORAL | Status: DC

## 2021-11-29 MED ORDER — ONDANSETRON HCL 4 MG/2ML IJ SOLN
INTRAMUSCULAR | Status: DC | PRN
  Administered 2021-11-29: 4 mg via INTRAVENOUS

## 2021-11-29 MED ORDER — DULOXETINE HCL 30 MG PO CPEP
30.0000 mg | ORAL_CAPSULE | Freq: Every day | ORAL | Status: DC
  Administered 2021-11-29 – 2021-12-01 (×3): 30 mg via ORAL
  Filled 2021-11-29 (×3): qty 1

## 2021-11-29 MED ORDER — MAGNESIUM HYDROXIDE 400 MG/5ML PO SUSP
30.0000 mL | Freq: Every day | ORAL | Status: DC | PRN
  Administered 2021-11-30: 09:00:00 30 mL via ORAL
  Filled 2021-11-29: qty 30

## 2021-11-29 MED ORDER — METOCLOPRAMIDE HCL 5 MG/ML IJ SOLN
5.0000 mg | Freq: Three times a day (TID) | INTRAMUSCULAR | Status: DC | PRN
  Administered 2021-11-29: 20:00:00 10 mg via INTRAVENOUS
  Filled 2021-11-29: qty 2

## 2021-11-29 MED ORDER — CEFAZOLIN SODIUM-DEXTROSE 2-4 GM/100ML-% IV SOLN
2.0000 g | INTRAVENOUS | Status: AC
  Administered 2021-11-29: 08:00:00 2 g via INTRAVENOUS

## 2021-11-29 MED ORDER — ACETAMINOPHEN 325 MG PO TABS
325.0000 mg | ORAL_TABLET | Freq: Four times a day (QID) | ORAL | Status: DC | PRN
  Administered 2021-11-30: 13:00:00 650 mg via ORAL
  Filled 2021-11-29 (×2): qty 1

## 2021-11-29 MED ORDER — FLEET ENEMA 7-19 GM/118ML RE ENEM: 1.0000 | ENEMA | Freq: Once | RECTAL | Status: DC | PRN

## 2021-11-29 MED ORDER — EPHEDRINE 5 MG/ML INJ
INTRAVENOUS | Status: AC
  Filled 2021-11-29: qty 5

## 2021-11-29 MED ORDER — BUPIVACAINE HCL (PF) 0.5 % IJ SOLN
INTRAMUSCULAR | Status: DC | PRN
  Administered 2021-11-29: 2.5 mL

## 2021-11-29 MED ORDER — DIPHENHYDRAMINE HCL 50 MG/ML IJ SOLN
INTRAMUSCULAR | Status: AC
  Filled 2021-11-29: qty 1

## 2021-11-29 MED ORDER — ONDANSETRON HCL 4 MG/2ML IJ SOLN
INTRAMUSCULAR | Status: AC
  Filled 2021-11-29: qty 2

## 2021-11-29 MED ORDER — BUPIVACAINE-EPINEPHRINE (PF) 0.5% -1:200000 IJ SOLN
INTRAMUSCULAR | Status: AC
  Filled 2021-11-29: qty 30

## 2021-11-29 MED ORDER — SODIUM CHLORIDE (PF) 0.9 % IJ SOLN
INTRAMUSCULAR | Status: DC | PRN
  Administered 2021-11-29: 90 mL via INTRAMUSCULAR

## 2021-11-29 MED ORDER — LEVOTHYROXINE SODIUM 100 MCG PO TABS
100.0000 ug | ORAL_TABLET | Freq: Every day | ORAL | Status: DC
  Administered 2021-11-30 – 2021-12-01 (×2): 100 ug via ORAL
  Filled 2021-11-29 (×2): qty 1

## 2021-11-29 MED ORDER — APIXABAN 2.5 MG PO TABS
2.5000 mg | ORAL_TABLET | Freq: Two times a day (BID) | ORAL | Status: DC
  Administered 2021-11-30 – 2021-12-01 (×3): 2.5 mg via ORAL
  Filled 2021-11-29 (×3): qty 1

## 2021-11-29 MED ORDER — METOCLOPRAMIDE HCL 10 MG PO TABS
5.0000 mg | ORAL_TABLET | Freq: Three times a day (TID) | ORAL | Status: DC | PRN
  Administered 2021-11-30: 14:00:00 10 mg via ORAL
  Filled 2021-11-29: qty 1

## 2021-11-29 MED ORDER — CEFAZOLIN SODIUM-DEXTROSE 2-4 GM/100ML-% IV SOLN
INTRAVENOUS | Status: AC
  Filled 2021-11-29: qty 100

## 2021-11-29 MED ORDER — ONDANSETRON HCL 4 MG PO TABS
4.0000 mg | ORAL_TABLET | Freq: Four times a day (QID) | ORAL | Status: DC | PRN
  Administered 2021-11-30 – 2021-12-01 (×2): 4 mg via ORAL
  Filled 2021-11-29 (×2): qty 1

## 2021-11-29 MED ORDER — HYDROCODONE-ACETAMINOPHEN 5-325 MG PO TABS
1.0000 | ORAL_TABLET | Freq: Four times a day (QID) | ORAL | Status: DC | PRN
Start: 2021-11-29 — End: 2021-12-01
  Administered 2021-11-29 – 2021-12-01 (×3): 2 via ORAL
  Filled 2021-11-29 (×5): qty 2

## 2021-11-29 MED ORDER — TURMERIC CURCUMIN 500 MG PO CAPS: ORAL_CAPSULE | Freq: Every day | ORAL | Status: DC

## 2021-11-29 MED ORDER — ESTRADIOL 1 MG PO TABS
1.0000 mg | ORAL_TABLET | Freq: Every day | ORAL | Status: DC
  Administered 2021-11-30 – 2021-12-01 (×2): 1 mg via ORAL
  Filled 2021-11-29 (×2): qty 1

## 2021-11-29 MED ORDER — TRANEXAMIC ACID 1000 MG/10ML IV SOLN
INTRAVENOUS | Status: AC
  Filled 2021-11-29: qty 10

## 2021-11-29 MED ORDER — ONDANSETRON HCL 4 MG/2ML IJ SOLN: 4.0000 mg | Freq: Once | INTRAMUSCULAR | Status: DC | PRN

## 2021-11-29 MED ORDER — KETOROLAC TROMETHAMINE 15 MG/ML IJ SOLN
INTRAMUSCULAR | Status: AC
  Filled 2021-11-29: qty 1

## 2021-11-29 MED ORDER — BISACODYL 10 MG RE SUPP
10.0000 mg | Freq: Every day | RECTAL | Status: DC | PRN
  Filled 2021-11-29: qty 1

## 2021-11-29 MED ORDER — BUPIVACAINE HCL (PF) 0.5 % IJ SOLN
INTRAMUSCULAR | Status: AC
  Filled 2021-11-29: qty 10

## 2021-11-29 MED ORDER — ACETAMINOPHEN 500 MG PO TABS
1000.0000 mg | ORAL_TABLET | Freq: Four times a day (QID) | ORAL | Status: AC
  Administered 2021-11-29 (×2): 1000 mg via ORAL
  Filled 2021-11-29 (×4): qty 2

## 2021-11-29 MED ORDER — PROPOFOL 500 MG/50ML IV EMUL
INTRAVENOUS | Status: DC | PRN
Start: 2021-11-29 — End: 2021-11-29
  Administered 2021-11-29: 35 ug/kg/min via INTRAVENOUS

## 2021-11-29 MED ORDER — ACETAMINOPHEN 10 MG/ML IV SOLN
INTRAVENOUS | Status: AC
  Filled 2021-11-29: qty 100

## 2021-11-29 MED ORDER — BIOTIN 5000 MCG PO TABS: 5000.0000 ug | ORAL_TABLET | Freq: Every day | ORAL | Status: DC

## 2021-11-29 MED ORDER — PHENYLEPHRINE HCL (PRESSORS) 10 MG/ML IV SOLN
INTRAVENOUS | Status: DC | PRN
  Administered 2021-11-29: 80 ug via INTRAVENOUS

## 2021-11-29 MED ORDER — TRANEXAMIC ACID 1000 MG/10ML IV SOLN
INTRAVENOUS | Status: DC | PRN
  Administered 2021-11-29: 10:00:00 1000 mg via TOPICAL

## 2021-11-29 MED ORDER — CEFAZOLIN SODIUM-DEXTROSE 1-4 GM/50ML-% IV SOLN
INTRAVENOUS | Status: DC | PRN
  Administered 2021-11-29: 1 g via INTRAVENOUS

## 2021-11-29 SURGICAL SUPPLY — 70 items
BEARING TIBIAL 71 16 THK (Knees) ×1 IMPLANT
BIT DRILL QUICK REL 1/8 2PK SL (DRILL) IMPLANT
BLADE SAW SAG 25X90X1.19 (BLADE) ×2 IMPLANT
BLADE SURG SZ20 CARB STEEL (BLADE) ×2 IMPLANT
BNDG ELASTIC 6X5.8 VLCR NS LF (GAUZE/BANDAGES/DRESSINGS) ×2 IMPLANT
CANISTER WOUND CARE 500ML ATS (WOUND CARE) ×1 IMPLANT
CEMENT BONE R 1X40 (Cement) ×4 IMPLANT
CEMENT VACUUM MIXING SYSTEM (MISCELLANEOUS) ×2 IMPLANT
CHLORAPREP W/TINT 26 (MISCELLANEOUS) ×3 IMPLANT
COMP PAT 3 PEG SERIES A 31/6.2 (Miscellaneous) ×2 IMPLANT
COMPONENT PAT3 PEG SERS 31/6.2 (Miscellaneous) IMPLANT
COOLER POLAR GLACIER W/PUMP (MISCELLANEOUS) ×2 IMPLANT
COVER MAYO STAND REUSABLE (DRAPES) ×2 IMPLANT
CUFF TOURN SGL QUICK 24 (TOURNIQUET CUFF)
CUFF TOURN SGL QUICK 34 (TOURNIQUET CUFF)
CUFF TRNQT CYL 24X4X16.5-23 (TOURNIQUET CUFF) IMPLANT
CUFF TRNQT CYL 34X4.125X (TOURNIQUET CUFF) IMPLANT
DRAPE 3/4 80X56 (DRAPES) ×2 IMPLANT
DRAPE IMP U-DRAPE 54X76 (DRAPES) ×2 IMPLANT
DRAPE INCISE 23X17 IOBAN STRL (DRAPES) ×1
DRAPE INCISE 23X17 STRL (DRAPES) ×1 IMPLANT
DRAPE INCISE IOBAN 23X17 STRL (DRAPES) ×1 IMPLANT
DRAPE U-SHAPE 47X51 STRL (DRAPES) ×2 IMPLANT
DRILL QUICK RELEASE 1/8 INCH (DRILL) ×3
DRSG MEPILEX SACRM 8.7X9.8 (GAUZE/BANDAGES/DRESSINGS) ×1 IMPLANT
DRSG OPSITE POSTOP 4X10 (GAUZE/BANDAGES/DRESSINGS) ×1 IMPLANT
DRSG OPSITE POSTOP 4X8 (GAUZE/BANDAGES/DRESSINGS) ×1 IMPLANT
ELECT REM PT RETURN 9FT ADLT (ELECTROSURGICAL) ×2
ELECTRODE REM PT RTRN 9FT ADLT (ELECTROSURGICAL) ×1 IMPLANT
GAUZE XEROFORM 1X8 LF (GAUZE/BANDAGES/DRESSINGS) ×2 IMPLANT
GLOVE SRG 8 PF TXTR STRL LF DI (GLOVE) ×1 IMPLANT
GLOVE SURG ENC MOIS LTX SZ7.5 (GLOVE) ×8 IMPLANT
GLOVE SURG ENC MOIS LTX SZ8 (GLOVE) ×8 IMPLANT
GLOVE SURG UNDER LTX SZ8 (GLOVE) ×2 IMPLANT
GLOVE SURG UNDER POLY LF SZ8 (GLOVE) ×1
GOWN STRL REUS W/ TWL LRG LVL3 (GOWN DISPOSABLE) ×1 IMPLANT
GOWN STRL REUS W/ TWL XL LVL3 (GOWN DISPOSABLE) ×1 IMPLANT
GOWN STRL REUS W/TWL LRG LVL3 (GOWN DISPOSABLE) ×1
GOWN STRL REUS W/TWL XL LVL3 (GOWN DISPOSABLE) ×1
IV NS IRRIG 3000ML ARTHROMATIC (IV SOLUTION) ×2 IMPLANT
KIT PREVENA INCISION MGT20CM45 (CANNISTER) ×1 IMPLANT
KIT TURNOVER KIT A (KITS) ×2 IMPLANT
KNEE CR FEMORAL RT 65MM (Femur) ×1 IMPLANT
MANIFOLD NEPTUNE II (INSTRUMENTS) ×2 IMPLANT
NDL SPNL 20GX3.5 QUINCKE YW (NEEDLE) ×1 IMPLANT
NEEDLE SPNL 20GX3.5 QUINCKE YW (NEEDLE) ×2 IMPLANT
NS IRRIG 1000ML POUR BTL (IV SOLUTION) ×1 IMPLANT
NS IRRIG 500ML POUR BTL (IV SOLUTION) ×1 IMPLANT
PACK TOTAL KNEE (MISCELLANEOUS) ×2 IMPLANT
PAD WRAPON POLAR KNEE (MISCELLANEOUS) ×1 IMPLANT
PLATE KNEE TIBIAL 71MM FIXED (Plate) ×1 IMPLANT
PULSAVAC PLUS IRRIG FAN TIP (DISPOSABLE) ×2
SET GUIDE SURG MEDIAL KNEE (INSTRUMENTS) ×1 IMPLANT
SPONGE T-LAP 18X18 ~~LOC~~+RFID (SPONGE) ×6 IMPLANT
STAPLER SKIN PROX 35W (STAPLE) ×2 IMPLANT
STOCKINETTE IMPERV 14X48 (MISCELLANEOUS) ×1 IMPLANT
SUCTION FRAZIER HANDLE 10FR (MISCELLANEOUS) ×1
SUCTION TUBE FRAZIER 10FR DISP (MISCELLANEOUS) ×1 IMPLANT
SUT VIC AB 0 CT1 36 (SUTURE) ×6 IMPLANT
SUT VIC AB 2-0 CT1 (SUTURE) ×3 IMPLANT
SUT VIC AB 2-0 CT1 27 (SUTURE) ×3
SUT VIC AB 2-0 CT1 TAPERPNT 27 (SUTURE) ×3 IMPLANT
SYR 10ML LL (SYRINGE) ×3 IMPLANT
SYR 20ML LL LF (SYRINGE) ×2 IMPLANT
SYR 30ML LL (SYRINGE) ×3 IMPLANT
TIP FAN IRRIG PULSAVAC PLUS (DISPOSABLE) ×1 IMPLANT
TRAP FLUID SMOKE EVACUATOR (MISCELLANEOUS) ×2 IMPLANT
WATER STERILE IRR 1000ML POUR (IV SOLUTION) ×2 IMPLANT
WATER STERILE IRR 500ML POUR (IV SOLUTION) ×1 IMPLANT
WRAPON POLAR PAD KNEE (MISCELLANEOUS) ×2

## 2021-11-29 NOTE — H&P (Signed)
History of Present Illness: Janet Galvan is a 75 y.o. female who presents today for her surgical history and physical for upcoming right total knee arthroplasty. Surgery is scheduled with Dr. Joice Lofts on 11/29/2021. The patient denies any changes in her medical history since she was last evaluated. She denies any personal history of heart attack, stroke, asthma or COPD. Patient denies any trauma or injury affecting the right knee since her last evaluation. Pain score today is a 9 out of 10. The patient continues to report a constant popping sensation in her right knee. The patient is status post a left total knee arthroplasty, the patient did have to undergo a irrigation and debridement following the initial total knee replacement for presumed infection. The patient does report a history of latex allergy, she states that last time she attempted to wear the TED hose stockings she had to place padding between the stockings and her skin due to blister formation.  Past Medical History:  Allergic rhinitis   Cataract cortical, senile   Depression 12/12/2020   GERD (gastroesophageal reflux disease)   History of colonic polyps   Hormone deficiency   Hydronephrosis of right kidney, unspecified, unspecified 12/12/2020   Hyperlipidemia   Hypertension   Hypothyroidism   Obesity   Osteoarthritis  a. Knee b. Lumbar spine c. Cervical spine d. Left shoulder e. Hands   Venous stasis  w/ peripheral edema   Past Surgical History:  COLONOSCOPY 06/02/2018 (Diverticulosis/FHx CP/No Repeat due to age/TKT)   EGD 06/02/2018 (Negative EGD biopsy/No Repeat/TKT)   ARTHROPLASTY TOTAL SHOULDER Left 08/23/2020  Procedure: ARTHROPLASTY, TOTAL SHOULDER, REVERSE; Surgeon: Kennith Maes, MD; Location: [facility]; Service: Orthopedics; Laterality: Left;   TENODESIS BICEPS W/TRANSPLANTATION LONG TENDON OPEN Left 08/23/2020  Procedure: TENODESIS OF LONG TENDON OF BICEPS; Surgeon: Kennith Maes, MD;  Location: [facility]; Service: Orthopedics; Laterality: Left;   EGD 06/20/2021 (Gastritis/No repeat/TKT)   Ankle arthroscopy Right 1980   BACK SURGERY   Back surgery s/p fusion   CATARACT EXTRACTION   CHOLECYSTECTOMY (year 2000)   ENDOSCOPIC CARPAL TUNNEL RELEASE   HYSTERECTOMY   REPLACEMENT TOTAL KNEE Left (2009)   spinal fusion surgery (Year of 2007 L4-L5)   TONSILLECTOMY   TOTAL REVERSE SHOULDER REPLACEMENT (right)  TUBAL LIGATION   Past Family History:  Thrombosis Mother   Heart disease Mother   Dementia Mother   Glaucoma Mother   Colon polyps Mother   Myocardial Infarction (Heart attack) Father   Parkinsonism Father   Macular degeneration Father   Coronary Artery Disease (Blocked arteries around heart) Father   Anxiety Daughter   Bipolar disorder Daughter (also her daughter has it too)   Stroke Daughter   Seizures Daughter   Bipolar disorder Daughter   Obesity Daughter   COPD Daughter   Breast cancer Maternal Aunt   Lupus Maternal Uncle   Anesthesia problems Neg Hx   Malignant hypertension Neg Hx   Malignant hyperthermia Neg Hx   Pseudochol deficiency Neg Hx   PONV Neg Hx   Medications:  acetaminophen (TYLENOL) 650 MG ER tablet Take 650 mg by mouth as needed for Pain   biotin 5 mg Tab Take by mouth Take 5,000 mcg by mouth daily.   cranberry fruit extract (CRANBERRY EXTRACT ORAL) Take 1 capsule by mouth 2 (two) times daily   diclofenac (VOLTAREN) 1 % topical gel Apply 2 g topically 2 (two) times daily 100 g 2   DULoxetine (CYMBALTA) 30 MG DR capsule Take 1 capsule (30  mg total) by mouth once daily 90 capsule 3   estradioL (ESTRACE) 1 MG tablet TAKE 1 TABLET BY MOUTH ONCE DAILY 90 tablet 3   gabapentin (NEURONTIN) 100 MG capsule TAKE 3 CAPSULES BY MOUTH AT NIGHT 270 capsule 3   levothyroxine (SYNTHROID) 100 MCG tablet TAKE 1 TABLET BY MOUTH DAILY 90 tablet 3   pantoprazole (PROTONIX) 40 MG DR tablet Take 1 tablet (40 mg total) by mouth once daily 90 tablet 4    rosuvastatin (CRESTOR) 20 MG tablet TAKE 1 TABLET BY MOUTH DAILY 90 tablet 3   TURMERIC ORAL Take by mouth once daily   Allergies:  Latex Rash and Other (blisters) Compression stockings cause blisters.  Bandaids cause red skin   Morphine Nausea and Vomiting   Ciprofloxacin (Bulk) Rash   Oxycodone Nausea   Augmentin [Amoxicillin-Pot Clavulanate] Rash   Ketoprofen Unknown and Nausea   Review of Systems:  A comprehensive 14 point ROS was performed, reviewed by me today, and the pertinent orthopaedic findings are documented in the HPI.  Physical Exam: BP 128/80   Ht 144.8 cm (4' 9.01")   Wt (!) 101.6 kg (224 lb)   BMI 48.46 kg/m  General/Constitutional: The patient appears to be well-nourished, well-developed, and in no acute distress. Neuro/Psych: Normal mood and affect, oriented to person, place and time. Eyes: Non-icteric. Pupils are equal, round, and reactive to light, and exhibit synchronous movement. ENT: Unremarkable. Lymphatic: No palpable adenopathy. Respiratory: Lungs clear to auscultation, Normal chest excursion, No wheezes and Non-labored breathing Cardiovascular: Regular rate and rhythm. No murmurs. and No edema, swelling or tenderness, except as noted in detailed exam. Integumentary: No impressive skin lesions present, except as noted in detailed exam. Musculoskeletal: Unremarkable, except as noted in detailed exam.  Right knee exam: GAIT: Moderately antalgic gait, and uses a cane for balance and support ALIGNMENT: Moderate varus SKIN: Unremarkable SWELLING: Mild EFFUSION: Small WARMTH: None TENDERNESS: Moderately tender along medial greater than lateral joint line ROM: 5-80 degrees with pain at the extremes of both flexion and extension McMURRAY'S: Equivocal PATELLOFEMORAL: Normal tracking with no peri-patellar tenderness and negative apprehension sign CREPITUS: Moderate patellofemoral crepitance LACHMAN'S: Negative PIVOT SHIFT: Negative ANTERIOR DRAWER:  Negative POSTERIOR DRAWER: Negative VARUS/VALGUS: Positive pseudolaxity to varus stressing   She is neurovascularly intact to the right lower extremity and foot.   X-rays/MRI/Lab data:  Standing AP and lateral x-rays of the right knee, as well as a sunrise view, were obtained previously and reviewed today. These films demonstrate severe degenerative joint disease of the right knee with complete loss of the medial compartment clear space and some erosion into the proximal tibia. Substantial osteophytes are noted medially more so than laterally. Moderate narrowing of the lateral compartment clear space also is noted, but the patellofemoral compartment appears to be reasonably well-maintained. No lytic lesions or fractures are identified.  Impression: Primary osteoarthritis of right knee.  Plan:  1. Treatment options were discussed today with the patient. 2. The patient is scheduled for a right total knee arthroplasty with Dr. Joice Lofts on 11/29/2021. 3. The patient was instructed on the risk and benefits of surgery and wishes to proceed at this time. 4. This document will serve as a surgical history and physical for the patient. 5. The patient will follow-up per standard postop. They can call the clinic they have any questions, new symptoms develop or symptoms worsen.  The procedure was discussed with the patient, as were the potential risks (including bleeding, infection, nerve and/or blood vessel injury, persistent  or recurrent pain, failure of the hardware, need for further surgery, blood clots, strokes, heart attacks and/or arhythmias, pneumonia, etc.) and benefits. The patient states her understanding and wishes to proceed.   H&P reviewed and patient re-examined. No changes.

## 2021-11-29 NOTE — Progress Notes (Signed)
PHARMACIST - PHYSICIAN ORDER COMMUNICATION  CONCERNING: P&T Medication Policy on Herbal Medications  DESCRIPTION:  This patients order for:  CRANBERRY-VITAMIN C, BIOTIN, AND TURMERIC have been noted.  This product(s) is classified as an herbal or natural product. Due to a lack of definitive safety studies or FDA approval, nonstandard manufacturing practices, plus the potential risk of unknown drug-drug interactions while on inpatient medications, the Pharmacy and Therapeutics Committee does not permit the use of herbal or natural products of this type within [facility].   ACTION TAKEN: The pharmacy department is unable to verify this order at this time and your patient has been informed of this safety policy. Please reevaluate patients clinical condition at discharge and address if the herbal or natural product(s) should be resumed at that time.  Drusilla Kanner, PharmD, MBA

## 2021-11-29 NOTE — Evaluation (Signed)
Physical Therapy Evaluation Patient Details Name: Janet Galvan MRN: [id number] DOB: [birth year] Today's Date: 11/29/2021  History of Present Illness  Patient admitted for R TKA. PMH includes L TKA, back surgery, obesity, HTN, depression, GERD, Hypothyroidism  Clinical Impression  Patient received in bed, she is agreeable to PT assessment. Patient required min guard for supine to sit. Min assist with cues for safety with sit to stand. Min assist for taking a few steps from bed to recliner. Patient is R LE pain limited at this time. She will continue to benefit from skilled PT while here to improve functional independence, strength, ROM and safety.         Recommendations for follow up therapy are one component of a multi-disciplinary discharge planning process, led by the attending physician.  Recommendations may be updated based on patient status, additional functional criteria and insurance authorization.  Follow Up Recommendations Home health PT    Assistance Recommended at Discharge Intermittent Supervision/Assistance  Functional Status Assessment Patient has had a recent decline in their functional status and demonstrates the ability to make significant improvements in function in a reasonable and predictable amount of time.  Equipment Recommendations  BSC/3in1;Other (comment) (she may need youth wide RW)    Recommendations for Other Services OT consult     Precautions / Restrictions Precautions Precautions: Fall Restrictions Weight Bearing Restrictions: Yes RLE Weight Bearing: Weight bearing as tolerated      Mobility  Bed Mobility Overal bed mobility: Needs Assistance Bed Mobility: Supine to Sit     Supine to sit: Min guard     General bed mobility comments: min guard to move R LE off bed.    Transfers Overall transfer level: Needs assistance Equipment used: Rolling walker (2 wheels) Transfers: Sit to/from Stand Sit to Stand: Min assist            General transfer comment: cues for hand placement needed    Ambulation/Gait Ambulation/Gait assistance: Min assist Gait Distance (Feet): 2 Feet Assistive device: Rolling walker (2 wheels) Gait Pattern/deviations: Step-to pattern;Decreased weight shift to right;Decreased step length - left;Decreased stride length Gait velocity: decr     General Gait Details: patient able to take a few pivoting steps from bed to recliner. Limited by pain.  Stairs            Wheelchair Mobility    Modified Rankin (Stroke Patients Only)       Balance Overall balance assessment: Needs assistance Sitting-balance support: Feet supported Sitting balance-Leahy Scale: Good     Standing balance support: Bilateral upper extremity supported;During functional activity;Reliant on assistive device for balance Standing balance-Leahy Scale: Poor Standing balance comment: reliant on B UE support and min assist                             Pertinent Vitals/Pain Pain Assessment: 0-10 Pain Score: 7  Pain Location: R knee Pain Descriptors / Indicators: Discomfort;Sore;Operative site guarding Pain Intervention(s): Monitored during session;Limited activity within patient's tolerance;Premedicated before session;Ice applied;Repositioned    Home Living Family/patient expects to be discharged to:: Private residence Living Arrangements: Children Available Help at Discharge: Family;Available PRN/intermittently Type of Home: House Home Access: Level entry;Stairs to enter   Entrance Stairs-Number of Steps: 1 step from garage, level in front   Home Layout: One level   Additional Comments: states she has a walker, unsure if it is low enough or wide enough    Prior Function Prior Level of Function :  Independent/Modified Independent             Mobility Comments: was using cane prior to admission ADLs Comments: independent, lives with daughter who can assist as needed     Hand Dominance         Extremity/Trunk Assessment   Upper Extremity Assessment Upper Extremity Assessment: Defer to OT evaluation    Lower Extremity Assessment Lower Extremity Assessment: RLE deficits/detail RLE: Unable to fully assess due to pain RLE Coordination: decreased gross motor    Cervical / Trunk Assessment Cervical / Trunk Assessment: Normal  Communication   Communication: No difficulties  Cognition Arousal/Alertness: Awake/alert Behavior During Therapy: WFL for tasks assessed/performed Overall Cognitive Status: Within Functional Limits for tasks assessed                                          General Comments      Exercises Total Joint Exercises Ankle Circles/Pumps: AROM;Both;10 reps Quad Sets: AROM;Right;5 reps Goniometric ROM: 0-85   Assessment/Plan    PT Assessment Patient needs continued PT services  PT Problem List Decreased strength;Decreased mobility;Decreased activity tolerance;Decreased balance;Pain;Decreased safety awareness       PT Treatment Interventions DME instruction;Therapeutic activities;Gait training;Therapeutic exercise;Patient/family education;Functional mobility training;Balance training    PT Goals (Current goals can be found in the Care Plan section)  Acute Rehab PT Goals Patient Stated Goal: to return home, decrease pain PT Goal Formulation: With patient Time For Goal Achievement: 12/13/21 Potential to Achieve Goals: Good    Frequency BID   Barriers to discharge        Co-evaluation               AM-PAC PT "6 Clicks" Mobility  Outcome Measure Help needed turning from your back to your side while in a flat bed without using bedrails?: A Little Help needed moving from lying on your back to sitting on the side of a flat bed without using bedrails?: A Little Help needed moving to and from a bed to a chair (including a wheelchair)?: A Little Help needed standing up from a chair using your arms (e.g., wheelchair or  bedside chair)?: A Little Help needed to walk in hospital room?: A Lot Help needed climbing 3-5 steps with a railing? : A Lot 6 Click Score: 16    End of Session Equipment Utilized During Treatment: Gait belt Activity Tolerance: Patient limited by pain Patient left: in chair;with call bell/phone within reach;with chair alarm set Nurse Communication: Mobility status PT Visit Diagnosis: Unsteadiness on feet (R26.81);Muscle weakness (generalized) (M62.81);Difficulty in walking, not elsewhere classified (R26.2);Pain;Other abnormalities of gait and mobility (R26.89) Pain - Right/Left: Right Pain - part of body: Knee    Time: 1345-1427 PT Time Calculation (min) (ACUTE ONLY): 42 min   Charges:   PT Evaluation $PT Eval Moderate Complexity: 1 Mod PT Treatments $Gait Training: 8-22 mins        [employer], PT, GCS 11/29/21,3:06 PM

## 2021-11-29 NOTE — Op Note (Signed)
11/29/2021  10:42 AM  Patient:   Janet Galvan  Pre-Op Diagnosis:   Advanced degenerative joint disease, right knee.  Post-Op Diagnosis:   Same  Procedure:   Right TKA using a Signature-guided all-cemented [employer] Vanguard system with a 65 mm PCR femur, a 71 mm tibial tray with a 16 mm anterior stabilized E-poly insert, and a 31 x 6.2 mm all-poly 3-pegged domed patella.  Surgeon:   Maryagnes Amos, MD  Assistant:   Horris Latino, PA-C   Anesthesia:   Spinal  Findings:   As above  Complications:   None  EBL:   25 cc  Fluids:   1300 cc crystalloid  UOP:   500 cc  TT:   120 minutes at 300 mmHg  Drains:   None  Closure:   Staples  Implants:   As above  Brief Clinical Note:   The patient is a 75 year old female with a long history of progressively worsening right knee pain. The patient's symptoms have progressed despite medications, activity modification, injections, etc. The patient's history and examination were consistent with advanced degenerative joint disease of the right knee confirmed by plain radiographs. The patient presents at this time for a right total knee arthroplasty.  Procedure:   The patient was brought into the operating room. After adequate spinal anesthesia was obtained, the patient was lain in the supine position. A Foley catheter was inserted by the nurse before the right lower extremity was prepped with ChloraPrep solution and draped sterilely. Preoperative antibiotics were administered. After verifying the proper laterality with a surgical timeout, the limb was exsanguinated with an Esmarch and the tourniquet inflated to 300 mmHg.   A standard anterior approach to the knee was made through an approximately 7 inch incision. The incision was carried down through the subcutaneous tissues to expose superficial retinaculum. This was split the length of the incision and the medial flap elevated sufficiently to expose the medial retinaculum. The medial  retinaculum was incised, leaving a 3-4 mm cuff of tissue on the patella. This was extended distally along the medial border of the patellar tendon and proximally through the medial third of the quadriceps tendon. A subtotal fat pad excision was performed before the soft tissues were elevated off the anteromedial and anterolateral aspects of the proximal tibia to the level of the collateral ligaments. The anterior portions of the medial and lateral menisci were removed, as was the anterior cruciate ligament.   With the knee flexed to 90, the Signature femoral guide was positioned and the anterior and distal pins placed.  The distal pins were removed before the guide was removed. The distal femoral cutting block was positioned and the distal cut made. The 65 mm 4-in-1 cutting block was positioned and, after verifying that the anterior cortex would not be notched, first the anterior, then the posterior, then the posterior chamfer, and finally the anterior chamfer cuts were made.  Attention was directed to the proximal tibia. The Signature femoral guide was positioned and the anterior pins placed. Utilizing these anterior pins, the proximal tibial guide was positioned and the appropriate cut made the surface was measured and found to be optimally replicated by the 71 mm component, as planned. A trial reduction was performed using the appropriate femoral and tibial components with first the 10 mm, then the 12 mm, the 14 mm, and finally the 16 mm insert. The 16 mm insert demonstrated excellent stability to varus and valgus stressing both in flexion and extension while permitting  full extension. Patella tracking was assessed and found to be satisfactory. Therefore, the tibial guide position was marked on the proximal tibia.   The patella thickness was measured and found to be 17 mm. Therefore, the appropriate cut was made. The patellar surface was measured and found to be optimally replicated by the 31 mm component.  The three peg holes were drilled in place before the trial button was inserted. Patella tracking was assessed and found to be somewhat tight. Therefore, a lateral release was performed. Subsequently, patellar tracking again was assessed and found to be satisfactory, passing the "no thumb test". The lug holes were drilled into the distal femur before the trial component was removed, leaving only the tibial tray. The keel was then created using the appropriate tower, reamer, and punch.  The bony surfaces were prepared for cementing by irrigating them thoroughly with sterile saline solution via the jet lavage system. A bone plug was fashioned from some of the bone that had been removed previously and used to plug the distal femoral canal. In addition, 20 cc of Exparel diluted out to 60 cc with normal saline and 30 cc of 0.5% Sensorcaine were injected into the postero-medial and postero-lateral aspects of the knee, the medial and lateral gutter regions, and the peri-incisional tissues to help with postoperative analgesia. Meanwhile, the cement was being mixed on the back table. When it was ready, the tibial tray was cemented in first. The excess cement was removed using Personal assistant. Next, the femoral component was impacted into place. Again, the excess cement was removed using Personal assistant. The 16 mm trial insert was positioned and the knee brought into extension while the cement hardened. Finally, the patella was cemented into place and secured using the patellar clamp. Again, the excess cement was removed using Personal assistant. Once the cement had hardened, the knee was placed through a range of motion with the findings as described above. Therefore, the trial insert was removed and, after verifying that no cement had been retained posteriorly, the permanent 16 mm anterior stabilized E-polyethylene insert was positioned and secured using the appropriate key locking mechanism. Again the knee was placed through  a range of motion with the findings as described above.  The wound was copiously irrigated with sterile saline solution using the jet lavage system before the quadriceps tendon and retinacular layer were reapproximated using #0 Vicryl interrupted sutures. The superficial retinacular layer also was closed using a running #0 Vicryl suture. A total of 10 cc of transexemic acid (TXA) was injected intra-articularly before the subcutaneous tissues were closed in several layers using 2-0 Vicryl interrupted sutures. The skin was closed using staples. A sterile honeycomb dressing was applied to the skin before the leg was wrapped with an Ace wrap to accommodate the Polar Care device. The patient was then awakened and returned to the recovery room in satisfactory condition after tolerating the procedure well.

## 2021-11-29 NOTE — Anesthesia Preprocedure Evaluation (Signed)
Anesthesia Evaluation  Patient identified by MRN, date of birth, ID band Patient awake    Reviewed: Allergy & Precautions, H&P , NPO status , Patient's Chart, lab work & pertinent test results  History of Anesthesia Complications Negative for: history of anesthetic complications  Airway Mallampati: III  TM Distance: <3 FB Neck ROM: limited    Dental  (+) Chipped, Poor Dentition   Pulmonary neg pulmonary ROS, neg shortness of breath,    Pulmonary exam normal        Cardiovascular Exercise Tolerance: Good hypertension (Pt Denies, No meds), (-) angina(-) Past MI and (-) DOE Normal cardiovascular exam     Neuro/Psych PSYCHIATRIC DISORDERS negative neurological ROS     GI/Hepatic Neg liver ROS, hiatal hernia, GERD  Medicated and Controlled,  Endo/Other  Hypothyroidism Morbid obesity  Renal/GU Renal disease  negative genitourinary   Musculoskeletal  (+) Arthritis ,   Abdominal   Peds  Hematology negative hematology ROS (+)   Anesthesia Other Findings Allergic rhinitis    Chronic kidney disease   Depression    GERD (gastroesophageal reflux disease) History of colon polyps   History of hiatal hernia    Hyperlipidemia    Hypertension    Hypothyroidism    Lymphedema of both lower extremities Obesity    Osteoarthritis    Venous stasis    Lumbar Fusion With Instrumentation   Reproductive/Obstetrics negative OB ROS                            Anesthesia Physical  Anesthesia Plan  ASA: 3  Anesthesia Plan: Spinal   Post-op Pain Management:    Induction: Intravenous  PONV Risk Score and Plan: Propofol infusion, Ondansetron and Midazolam  Airway Management Planned: Natural Airway and Mask  Additional Equipment:   Intra-op Plan:   Post-operative Plan:   Informed Consent: I have reviewed the patients History and Physical, chart, labs and discussed the procedure including the risks,  benefits and alternatives for the proposed anesthesia with the patient or authorized representative who has indicated his/her understanding and acceptance.     Dental Advisory Given  Plan Discussed with: Anesthesiologist, CRNA and Surgeon  Anesthesia Plan Comments: (Given spine history advised that GA may be necessary if unable to place spinal.  Patient consented for risks of anesthesia including but not limited to:  - adverse reactions to medications - risk of intubation if required - damage to teeth, lips or other oral mucosa - sore throat or hoarseness - Damage to heart, brain, lungs or loss of life  Patient voiced understanding.)       Anesthesia Quick Evaluation

## 2021-11-29 NOTE — Anesthesia Postprocedure Evaluation (Signed)
Anesthesia Post Note  Patient: Gracy Racer  Procedure(s) Performed: TOTAL KNEE ARTHROPLASTY (Right: Knee)  Patient location during evaluation: PACU Anesthesia Type: Spinal Level of consciousness: awake and alert, awake and oriented Pain management: pain level controlled Vital Signs Assessment: post-procedure vital signs reviewed and stable Respiratory status: spontaneous breathing, nonlabored ventilation and respiratory function stable Cardiovascular status: blood pressure returned to baseline and stable Postop Assessment: no apparent nausea or vomiting Anesthetic complications: no   No notable events documented.   Last Vitals:  Vitals:   11/29/21 1050 11/29/21 1055  BP: (!) 115/53   Pulse: 76 77  Resp: 20 (!) 8  Temp:    SpO2: 100% 99%    Last Pain:  Vitals:   11/29/21 1048  PainSc: 0-No pain                 Manfred Arch

## 2021-11-29 NOTE — Transfer of Care (Signed)
Immediate Anesthesia Transfer of Care Note  Patient: Janet Galvan  Procedure(s) Performed: TOTAL KNEE ARTHROPLASTY (Right: Knee)  Patient Location: PACU  Anesthesia Type:General and Spinal  Level of Consciousness: awake, alert  and oriented  Airway & Oxygen Therapy: Patient Spontanous Breathing and Patient connected to face mask oxygen  Post-op Assessment: Report given to RN and Post -op Vital signs reviewed and stable  Post vital signs: Reviewed and stable  Last Vitals:  Vitals Value Taken Time  BP 107/46 11/29/21 1048  Temp    Pulse 78 11/29/21 1048  Resp 16 11/29/21 1048  SpO2 100 % 11/29/21 1048  Vitals shown include unvalidated device data.  Last Pain:  Vitals:   11/29/21 0619  PainSc: 0-No pain      Patients Stated Pain Goal: 0 (11/29/21 2330)  Complications: No notable events documented.

## 2021-11-29 NOTE — Progress Notes (Signed)
Met with the patient and her daughter at the bedside to discuss discharge plan and needs She lives at home with her daughter and she will provide mom with transportation to the doctor She can afford her medication She is set up with [facility] [place] She has a rolling walker but needs a 3 in 1, it will be delivered by [employer] prior to DC No additional needs

## 2021-11-29 NOTE — Anesthesia Procedure Notes (Signed)
Spinal  Patient location during procedure: OR Start time: 11/29/2021 7:48 AM End time: 11/29/2021 7:38 AM Reason for block: surgical anesthesia Staffing Performed: resident/CRNA  Resident/CRNA: Katherine Basset, CRNA Preanesthetic Checklist Completed: patient identified, IV checked, site marked, risks and benefits discussed, surgical consent, monitors and equipment checked, pre-op evaluation and timeout performed Spinal Block Patient position: sitting Prep: DuraPrep Patient monitoring: heart rate, continuous pulse ox, blood pressure and cardiac monitor Approach: midline Location: L2-3 Injection technique: single-shot Needle Needle type: Sprotte  Needle gauge: 24 G Needle length: 9 cm Assessment Sensory level: T4 Events: CSF return

## 2021-11-30 DIAGNOSIS — M1711 Unilateral primary osteoarthritis, right knee: Secondary | ICD-10-CM | POA: Diagnosis not present

## 2021-11-30 LAB — CBC
HCT: 35.8 % — ABNORMAL LOW (ref 36.0–46.0)
Hemoglobin: 11.7 g/dL — ABNORMAL LOW (ref 12.0–15.0)
MCH: 29.8 pg (ref 26.0–34.0)
MCHC: 32.7 g/dL (ref 30.0–36.0)
MCV: 91.3 fL (ref 80.0–100.0)
Platelets: 219 10*3/uL (ref 150–400)
RBC: 3.92 MIL/uL (ref 3.87–5.11)
RDW: 12.8 % (ref 11.5–15.5)
WBC: 7.8 10*3/uL (ref 4.0–10.5)
nRBC: 0 % (ref 0.0–0.2)

## 2021-11-30 LAB — BASIC METABOLIC PANEL
Anion gap: 8 (ref 5–15)
BUN: 16 mg/dL (ref 8–23)
CO2: 23 mmol/L (ref 22–32)
Calcium: 8 mg/dL — ABNORMAL LOW (ref 8.9–10.3)
Chloride: 108 mmol/L (ref 98–111)
Creatinine, Ser: 0.83 mg/dL (ref 0.44–1.00)
GFR, Estimated: >60 mL/min (ref >60)
Glucose, Bld: 129 mg/dL — ABNORMAL HIGH (ref 70–99)
Potassium: 4.2 mmol/L (ref 3.5–5.1)
Sodium: 139 mmol/L (ref 135–145)

## 2021-11-30 MED ORDER — HYDROCODONE-ACETAMINOPHEN 5-325 MG PO TABS: 1.0000 | ORAL_TABLET | Freq: Four times a day (QID) | ORAL | 0 refills | Status: DC | PRN

## 2021-11-30 MED ORDER — ONDANSETRON HCL 4 MG PO TABS: 4.0000 mg | ORAL_TABLET | Freq: Four times a day (QID) | ORAL | 0 refills | Status: DC | PRN

## 2021-11-30 MED ORDER — TRAMADOL HCL 50 MG PO TABS: 50.0000 mg | ORAL_TABLET | Freq: Three times a day (TID) | ORAL | 0 refills | Status: AC | PRN

## 2021-11-30 MED ORDER — APIXABAN 2.5 MG PO TABS: 2.5000 mg | ORAL_TABLET | Freq: Two times a day (BID) | ORAL | 0 refills | Status: DC

## 2021-11-30 NOTE — Discharge Summary (Signed)
Physician Discharge Summary  Patient ID: Janet Galvan MRN: [id number] DOB/AGE: [birth year] 75 y.o.  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  Discharge Diagnoses: Patient Active Problem List   Diagnosis Date Noted   Status post total knee replacement using cement, right 11/29/2021   Acute pyelonephritis 11/06/2020   E coli bacteremia 11/06/2020   GERD (gastroesophageal reflux disease)    Hyperlipidemia    Hypertension    Hypothyroidism    Depression    CKD (chronic kidney disease), stage IIIa    Hydronephrosis of right kidney     Past Medical History:  Diagnosis Date   Allergic rhinitis    Chronic kidney disease    Depression    GERD (gastroesophageal reflux disease)    History of colon polyps    History of hiatal hernia    Hyperlipidemia    Hypertension    Hypothyroidism    Lymphedema of both lower extremities    Obesity    Osteoarthritis    Venous stasis      Transfusion: None.   Consultants (if any):   Discharged Condition: Improved  Hospital Course: Janet Galvan is an 75 y.o. female who was admitted 11/29/2021 with a diagnosis of advanced degenerative joint disease and went to the operating room on 11/29/2021 and underwent the above named procedures.    Surgeries: Procedure(s): TOTAL KNEE ARTHROPLASTY on 11/29/2021 Patient tolerated the surgery well. Taken to PACU where she was stabilized and then transferred to the orthopedic floor.  Started on Eliquis 2.5mg  every 12 hrs. Foot pumps applied bilaterally at 80 mm. Heels elevated on bed with rolled towels. No evidence of DVT. Negative Homan. Physical therapy started on day #1 for gait training and transfer. OT started day #1 for ADL and assisted devices.  Patient's IV and foley were removed on POD1.  Implants: Right TKA using a Signature-guided  all-cemented [employer] Vanguard system with a 65 mm PCR femur, a 71 mm tibial tray with a 16 mm anterior stabilized E-poly insert, and a 31 x 6.2 mm all-poly 3-pegged domed patella.  She was given perioperative antibiotics:  Anti-infectives (From admission, onward)    Start     Dose/Rate Route Frequency Ordered Stop   11/29/21 1400  ceFAZolin (ANCEF) IVPB 3g/100 mL premix        3 g 200 mL/hr over 30 Minutes Intravenous Every 6 hours 11/29/21 1222 11/30/21 0218   11/29/21 0727  ceFAZolin (ANCEF) 1-4 GM/50ML-% IVPB       Note to Pharmacy: Register, Clydie Braun A: cabinet override      11/29/21 0727 11/29/21 0807   11/29/21 0630  ceFAZolin (ANCEF) IVPB 2g/100 mL premix        2 g 200 mL/hr over 30 Minutes Intravenous On call to O.R. 11/29/21 7829 11/29/21 0805   11/29/21 0624  ceFAZolin (ANCEF) 2-4 GM/100ML-% IVPB       Note to Pharmacy: Register, Clydie Braun A: cabinet override      11/29/21 0624 11/29/21 0807     .  She was given Foot pumps, early ambulation, and Eliquis for DVT prophylaxis.  She benefited maximally from the hospital stay and there were no complications.    Recent vital signs:  Vitals:   11/30/21 2325 12/01/21 0333  BP: (!) 130/55 (!) 112/48  Pulse: 83 81  Resp: 15 16  Temp: 98.2 F (36.8 C) 98.5 F (36.9 C)  SpO2: 98% 97%    Recent laboratory studies:  Lab Results  Component Value Date   HGB 10.4 (L) 12/01/2021   HGB 11.7 (L) 11/30/2021   HGB 13.0 11/20/2021   Lab Results  Component Value Date   WBC 9.0 12/01/2021   PLT 177 12/01/2021   Lab Results  Component Value Date   INR 1.0 11/06/2020   Lab Results  Component Value Date   NA 137 12/01/2021   K 4.3 12/01/2021   CL 108 12/01/2021   CO2 23 12/01/2021   BUN 17 12/01/2021   CREATININE 0.89 12/01/2021   GLUCOSE 110 (H) 12/01/2021    Discharge Medications:   Allergies as of 12/01/2021       Reactions   Latex Rash, Other (See Comments)   Compression stockings cause blisters.  Bandaids cause red  skin   Morphine Nausea Only   Other reaction(s): Vomiting   Ciprofloxacin Rash, Other (See Comments)   Blisters   Oxycodone Nausea Only   Ketoprofen Nausea Only   Morphine And Related Other (See Comments)   "crazy" - altered mental status   Amoxicillin Rash   Amoxicillin-pot Clavulanate Nausea Only, Rash   Bactrim [sulfamethoxazole-trimethoprim] Rash   Shrimp [shellfish Allergy] Rash        Medication List     STOP taking these medications    aspirin EC 81 MG tablet       TAKE these medications    acetaminophen 650 MG CR tablet Commonly known as: TYLENOL Take 1,300 mg by mouth every 8 (eight) hours as needed for pain.   apixaban 2.5 MG Tabs tablet Commonly known as: ELIQUIS Take 1 tablet (2.5 mg total) by mouth 2 (two) times daily.   Biotin 5000 MCG Tabs Take 5,000 mcg by mouth daily.   CRANBERRY-VITAMIN C PO Take 1 tablet by mouth in the morning and at bedtime.   diclofenac sodium 1 % Gel Commonly known as: VOLTAREN Apply 2 g topically 2 (two) times daily as needed (pain).   DULoxetine 30 MG capsule Commonly known as: CYMBALTA Take 30 mg by mouth daily.   estradiol 1 MG tablet Commonly known as: ESTRACE Take 1 mg by mouth daily.   gabapentin 100 MG capsule Commonly known as: NEURONTIN Take 300 mg by mouth at bedtime.   HYDROcodone-acetaminophen 5-325 MG tablet Commonly known as: NORCO/VICODIN Take 1-2 tablets by mouth every 6 (six) hours as needed for severe pain.   levothyroxine 100 MCG tablet Commonly known as: SYNTHROID Take 100 mcg by mouth daily.   ondansetron 4 MG tablet Commonly known as: ZOFRAN Take 1 tablet (4 mg total) by mouth every 6 (six) hours as needed for nausea.   pantoprazole 40 MG tablet Commonly known as: PROTONIX Take 40 mg by mouth at bedtime.   rosuvastatin 20 MG tablet Commonly known as: CRESTOR Take 20 mg by mouth daily.   traMADol 50 MG tablet Commonly known as: ULTRAM Take 1 tablet (50 mg total) by mouth  every 8 (eight) hours as needed for moderate pain.   TURMERIC CURCUMIN PO Take 1 capsule by mouth daily.               Durable Medical Equipment  (From admission, onward)           Start     Ordered   11/29/21 1223  DME Bedside commode  Once       Question:  Patient needs a bedside commode to treat with the following condition  Answer:  Status post total knee replacement using cement, right   11/29/21 1222   11/29/21 1223  DME 3 n 1  Once        11/29/21 1222   11/29/21 1223  DME Walker rolling  Once       Question Answer Comment  Walker: With 5 Inch Wheels   Patient needs a walker to treat with the following condition Status post total knee replacement using cement, right      11/29/21 1222            Diagnostic Studies: DG Knee Right Port  Result Date: 11/29/2021 CLINICAL DATA:  Postop right knee EXAM: PORTABLE RIGHT KNEE - 2 VIEW COMPARISON:  None. FINDINGS: Interval postsurgical changes from right total knee arthroplasty. Arthroplasty components appear in their expected alignment. No periprosthetic fracture is identified. Expected postoperative changes within the overlying soft tissues. IMPRESSION: Expected postsurgical findings of right knee arthroplasty. Electronically Signed   By: Allegra Lai M.D.   On: 11/29/2021 11:41    Disposition: Discharge disposition: 01-Home or Self Care     Plan for discharge home today pending progress with physical therapy.     Follow-up Information     Anson [place], PA-C Follow up in 14 day(s).   Specialty: Physician Assistant Why: Mindi Slicker information: [address] [place] [address] [phone]                Signed: Lenard Forth, Ceili Boshers PA-C 12/01/2021, 8:08 AM

## 2021-11-30 NOTE — Progress Notes (Signed)
Subjective: 1 Day Post-Op Procedure(s) (LRB): TOTAL KNEE ARTHROPLASTY (Right) Patient reports pain as mild.   Patient is well, and has had no acute complaints or problems Plan is to go Home after hospital stay. Negative for chest pain and shortness of breath Fever: no Gastrointestinal:Positive for nausea this morning. Patient is passing gas without pain this AM.  Objective: Vital signs in last 24 hours: Temp:  [96.8 F (36 C)-97.9 F (36.6 C)] 97.9 F (36.6 C) (12/30 0353) Pulse Rate:  [59-84] 60 (12/30 0353) Resp:  [4-24] 16 (12/30 0353) BP: (104-135)/(46-75) 135/60 (12/30 0353) SpO2:  [98 %-100 %] 99 % (12/30 0353)  Intake/Output from previous day:  Intake/Output Summary (Last 24 hours) at 11/30/2021 0742 Last data filed at 11/30/2021 4492 Gross per 24 hour  Intake 2138.64 ml  Output 1100 ml  Net 1038.64 ml    Intake/Output this shift: No intake/output data recorded.  Labs: No results for input(s): HGB in the last 72 hours. No results for input(s): WBC, RBC, HCT, PLT in the last 72 hours. No results for input(s): NA, K, CL, CO2, BUN, CREATININE, GLUCOSE, CALCIUM in the last 72 hours. No results for input(s): LABPT, INR in the last 72 hours.  EXAM General - Patient is Alert, Appropriate, and Oriented Extremity - ABD soft Neurovascular intact Dorsiflexion/Plantar flexion intact No cellulitis present Compartment soft Dressing/Incision - clean, dry, no drainage, Provena intact to the right leg this AM without any drainage. Motor Function - intact, moving foot and toes well on exam.  Abdomen soft with normal bowel sounds.  Past Medical History:  Diagnosis Date   Allergic rhinitis    Chronic kidney disease    Depression    GERD (gastroesophageal reflux disease)    History of colon polyps    History of hiatal hernia    Hyperlipidemia    Hypertension    Hypothyroidism    Lymphedema of both lower extremities    Obesity    Osteoarthritis    Venous stasis      Assessment/Plan: 1 Day Post-Op Procedure(s) (LRB): TOTAL KNEE ARTHROPLASTY (Right) Principal Problem:   Status post total knee replacement using cement, right  Estimated body mass index is 45.24 kg/m as calculated from the following:   Height as of this encounter: 4\' 11"  (1.499 m).   Weight as of this encounter: 101.6 kg. Advance diet Up with therapy D/C IV fluids when tolerating PO intake.  Vitals reviewed this AM.  Labs have not returned yet this morning. Patient is passing gas without pain this morning. Up with therapy today, patient has HHPT scheduled for tomorrow. Plan for possible d/c home today pending therapy.  DVT Prophylaxis - Foot Pumps and Eliquis Weight-Bearing as tolerated to right leg  J. , PA-C [facility] Orthopaedic Surgery 11/30/2021, 7:42 AM

## 2021-11-30 NOTE — Progress Notes (Signed)
Physical Therapy Treatment Patient Details Name: Janet Galvan MRN: [id number] DOB: [birth year] Today's Date: 11/30/2021   History of Present Illness Patient admitted for R TKA. PMH includes L TKA, back surgery, obesity, HTN, depression, GERD, Hypothyroidism    PT Comments    Pt seen for PT evaluation with pt endorsing significant soreness in R knee but is premedicated. Pt is able to ambulate short distance in room but is overall limited by pain despite being premedicated. Pt is eager to d/c home but would benefit from another session & is agreeable. PT to f/u after lunch.    Recommendations for follow up therapy are one component of a multi-disciplinary discharge planning process, led by the attending physician.  Recommendations may be updated based on patient status, additional functional criteria and insurance authorization.  Follow Up Recommendations  Home health PT     Assistance Recommended at Discharge Intermittent Supervision/Assistance  Equipment Recommendations  BSC/3in1;Other (comment) (bariatric youth RW)    Recommendations for Other Services       Precautions / Restrictions Precautions Precautions: Fall Restrictions Weight Bearing Restrictions: Yes RLE Weight Bearing: Weight bearing as tolerated     Mobility  Bed Mobility Overal bed mobility: Needs Assistance Bed Mobility: Sit to Supine       Sit to supine: Mod assist (cuing for turning to lie supine, assistance to elevate BLE onto bed)        Transfers Overall transfer level: Needs assistance Equipment used: Rolling walker (2 wheels) Transfers: Sit to/from Stand Sit to Stand: Min guard                Ambulation/Gait Ambulation/Gait assistance: Min Emergency planning/management officer (Feet): 22 Feet Assistive device: Rolling walker (2 wheels) Gait Pattern/deviations: Decreased step length - right;Decreased step length - left;Decreased stride length;Decreased dorsiflexion - right;Decreased  dorsiflexion - left;Decreased weight shift to right Gait velocity: decreased     General Gait Details: cuing to ambulate within base of RW when turning   Stairs             Wheelchair Mobility    Modified Rankin (Stroke Patients Only)       Balance Overall balance assessment: Needs assistance Sitting-balance support: Feet supported Sitting balance-Leahy Scale: Good     Standing balance support: Bilateral upper extremity supported;During functional activity;Reliant on assistive device for balance Standing balance-Leahy Scale: Fair                              Cognition Arousal/Alertness: Awake/alert Behavior During Therapy: WFL for tasks assessed/performed Overall Cognitive Status: Within Functional Limits for tasks assessed                                          Exercises Total Joint Exercises Long Arc Quad: AROM;Strengthening;Right;10 reps;Seated Knee Flexion: AAROM;Right;5 reps Goniometric ROM: 5 degrees from full extension - 70 degrees flexion    General Comments        Pertinent Vitals/Pain Pain Assessment: Faces Pain Score: 8  Pain Location: R knee Pain Descriptors / Indicators: Sore Pain Intervention(s): Monitored during session;Premedicated before session;Limited activity within patient's tolerance    Home Living                          Prior Function  PT Goals (current goals can now be found in the care plan section) Acute Rehab PT Goals Patient Stated Goal: to return home, decrease pain PT Goal Formulation: With patient Time For Goal Achievement: 12/13/21 Potential to Achieve Goals: Good Progress towards PT goals: Progressing toward goals    Frequency    BID      PT Plan Current plan remains appropriate    Co-evaluation              AM-PAC PT "6 Clicks" Mobility   Outcome Measure  Help needed turning from your back to your side while in a flat bed without using  bedrails?: A Little Help needed moving from lying on your back to sitting on the side of a flat bed without using bedrails?: A Little Help needed moving to and from a bed to a chair (including a wheelchair)?: A Little Help needed standing up from a chair using your arms (e.g., wheelchair or bedside chair)?: A Little Help needed to walk in hospital room?: A Little Help needed climbing 3-5 steps with a railing? : A Lot 6 Click Score: 17    End of Session Equipment Utilized During Treatment: Gait belt Activity Tolerance: Patient limited by pain Patient left: in bed;with call bell/phone within reach;with bed alarm set;with SCD's reapplied (wound vac intact) Nurse Communication: Mobility status PT Visit Diagnosis: Unsteadiness on feet (R26.81);Muscle weakness (generalized) (M62.81);Difficulty in walking, not elsewhere classified (R26.2);Pain;Other abnormalities of gait and mobility (R26.89) Pain - Right/Left: Right Pain - part of body: Knee     Time: 5400-8676 PT Time Calculation (min) (ACUTE ONLY): 24 min  Charges:  $Therapeutic Activity: 23-37 mins                     Aleda Grana, PT, DPT 11/30/21, 12:04 PM    Sandi Mariscal 11/30/2021, 12:02 PM

## 2021-11-30 NOTE — Discharge Instructions (Signed)
Diet: As you were doing prior to hospitalization   Shower:  May shower but keep the wounds dry, use an occlusive plastic wrap, NO SOAKING IN TUB.  If the bandage gets wet, change with a clean dry gauze.  Dressing:  Keep the prevena intact, HHPT will change this dressing for you.  If it begins to beep or loses suction can change to a standard dressing.  Activity:  Increase activity slowly as tolerated, but follow the weight bearing instructions below.  No lifting or driving for 6 weeks.  Weight Bearing:   Weight bearing as tolerated to right lower extremity  To prevent constipation: you may use a stool softener such as -  Colace (over the counter) 100 mg by mouth twice a day  Drink plenty of fluids (prune juice may be helpful) and high fiber foods Miralax (over the counter) for constipation as needed.    Itching:  If you experience itching with your medications, try taking only a single pain pill, or even half a pain pill at a time.  You may take up to 10 pain pills per day, and you can also use benadryl over the counter for itching or also to help with sleep.   Precautions:  If you experience chest pain or shortness of breath - call 911 immediately for transfer to the hospital emergency department!!  If you develop a fever greater that 101 F, purulent drainage from wound, increased redness or drainage from wound, or calf pain-Call [facility]                                              Follow- Up Appointment:  Please call for an appointment to be seen in 2 weeks at [facility]

## 2021-11-30 NOTE — Progress Notes (Signed)
Physical Therapy Treatment Patient Details Name: Janet Galvan MRN: [id number] DOB: 04/23/1946 Today's Date: 11/30/2021   History of Present Illness Patient admitted for R TKA. PMH includes L TKA, back surgery, obesity, HTN, depression, GERD, Hypothyroidism    PT Comments    Pt seen for PT tx with pt agreeable but reporting ongoing significant pain in R knee despite being premedicated. Pt demonstrates improving ability to complete bed mobility as she only required CGA<>supervision but does rely on hospital bed features. PT provides ongoing cuing for safe hand placement during sit>stand transfers & pt requires CGA<>supervision to ambulate household distances during session. Pt continues to be limited by nausea & pain & team made aware. Will continue to follow pt acutely to progress mobility as able.    Recommendations for follow up therapy are one component of a multi-disciplinary discharge planning process, led by the attending physician.  Recommendations may be updated based on patient status, additional functional criteria and insurance authorization.  Follow Up Recommendations  Home health PT     Assistance Recommended at Discharge Intermittent Supervision/Assistance  Equipment Recommendations  BSC/3in1;Other (comment) (bariatric youth RW)    Recommendations for Other Services       Precautions / Restrictions Precautions Precautions: Fall Restrictions Weight Bearing Restrictions: Yes RLE Weight Bearing: Weight bearing as tolerated     Mobility  Bed Mobility Overal bed mobility: Needs Assistance Bed Mobility: Sit to Supine;Supine to Sit     Supine to sit: Supervision;HOB elevated (use of bed rails) Sit to supine: Min guard;HOB elevated (use of bed rails; much improvement with elevating BLE onto bed without much physical assistance)        Transfers Overall transfer level: Needs assistance Equipment used: Rolling walker (2 wheels) Transfers: Sit to/from  Stand Sit to Stand: Supervision           General transfer comment: cuing to push up from stable surface vs BUE on RW    Ambulation/Gait Ambulation/Gait assistance: Min guard;Supervision Gait Distance (Feet): 55 Feet Assistive device: Rolling walker (2 wheels) Gait Pattern/deviations: Decreased step length - right;Decreased step length - left;Decreased stride length;Decreased dorsiflexion - right;Decreased dorsiflexion - left;Decreased weight shift to right Gait velocity: decreased     General Gait Details: cuing to ambulate within base of RW when turning   Stairs             Wheelchair Mobility    Modified Rankin (Stroke Patients Only)       Balance Overall balance assessment: Needs assistance Sitting-balance support: Feet supported Sitting balance-Leahy Scale: Good     Standing balance support: Bilateral upper extremity supported;During functional activity;Reliant on assistive device for balance Standing balance-Leahy Scale: Fair                              Cognition Arousal/Alertness: Awake/alert Behavior During Therapy: WFL for tasks assessed/performed Overall Cognitive Status: Within Functional Limits for tasks assessed                                 General Comments: Pleasant, eager to do well in PT        Exercises Total Joint Exercises Long Arc Quad: AROM;Strengthening;Right;10 reps;Seated Knee Flexion: AAROM;Right;5 reps Goniometric ROM: 5 degrees from full extension - 70 degrees flexion    General Comments General comments (skin integrity, edema, etc.): c/o nausea & SOB & general "not feeling well" after  gait, SpO2 95% on room air, HR 88 bpm      Pertinent Vitals/Pain Pain Assessment: 0-10 Pain Score: 9  Pain Location: R knee Pain Descriptors / Indicators: Sore;Discomfort Pain Intervention(s): Monitored during session;Premedicated before session;Limited activity within patient's tolerance    Home Living                           Prior Function            PT Goals (current goals can now be found in the care plan section) Acute Rehab PT Goals Patient Stated Goal: to return home, decrease pain PT Goal Formulation: With patient Time For Goal Achievement: 12/13/21 Potential to Achieve Goals: Good Progress towards PT goals: Progressing toward goals    Frequency    BID      PT Plan Current plan remains appropriate    Co-evaluation              AM-PAC PT "6 Clicks" Mobility   Outcome Measure  Help needed turning from your back to your side while in a flat bed without using bedrails?: A Little Help needed moving from lying on your back to sitting on the side of a flat bed without using bedrails?: A Little Help needed moving to and from a bed to a chair (including a wheelchair)?: A Little Help needed standing up from a chair using your arms (e.g., wheelchair or bedside chair)?: A Little Help needed to walk in hospital room?: A Little Help needed climbing 3-5 steps with a railing? : A Lot 6 Click Score: 17    End of Session Equipment Utilized During Treatment: Gait belt Activity Tolerance: Patient limited by pain Patient left: in bed;with call bell/phone within reach;with bed alarm set;with SCD's reapplied (wound vac & polar care donned) Nurse Communication:  (notified MD & nurse of slower progress during session & c/o nausea & pain) PT Visit Diagnosis: Unsteadiness on feet (R26.81);Muscle weakness (generalized) (M62.81);Difficulty in walking, not elsewhere classified (R26.2);Pain;Other abnormalities of gait and mobility (R26.89) Pain - Right/Left: Right Pain - part of body: Knee     Time: 1326-1340 PT Time Calculation (min) (ACUTE ONLY): 14 min  Charges:  $Therapeutic Activity: 8-22 mins                     Aleda Grana, PT, DPT 11/30/21, 1:51 PM    Sandi Mariscal 11/30/2021, 1:50 PM

## 2021-12-01 DIAGNOSIS — M1711 Unilateral primary osteoarthritis, right knee: Secondary | ICD-10-CM | POA: Diagnosis not present

## 2021-12-01 LAB — BASIC METABOLIC PANEL
Anion gap: 6 (ref 5–15)
BUN: 17 mg/dL (ref 8–23)
CO2: 23 mmol/L (ref 22–32)
Calcium: 8.3 mg/dL — ABNORMAL LOW (ref 8.9–10.3)
Chloride: 108 mmol/L (ref 98–111)
Creatinine, Ser: 0.89 mg/dL (ref 0.44–1.00)
GFR, Estimated: >60 mL/min (ref >60)
Glucose, Bld: 110 mg/dL — ABNORMAL HIGH (ref 70–99)
Potassium: 4.3 mmol/L (ref 3.5–5.1)
Sodium: 137 mmol/L (ref 135–145)

## 2021-12-01 LAB — CBC
HCT: 32.1 % — ABNORMAL LOW (ref 36.0–46.0)
Hemoglobin: 10.4 g/dL — ABNORMAL LOW (ref 12.0–15.0)
MCH: 29.1 pg (ref 26.0–34.0)
MCHC: 32.4 g/dL (ref 30.0–36.0)
MCV: 89.9 fL (ref 80.0–100.0)
Platelets: 177 10*3/uL (ref 150–400)
RBC: 3.57 MIL/uL — ABNORMAL LOW (ref 3.87–5.11)
RDW: 12.7 % (ref 11.5–15.5)
WBC: 9 10*3/uL (ref 4.0–10.5)
nRBC: 0 % (ref 0.0–0.2)

## 2021-12-01 NOTE — Progress Notes (Signed)
Discharge instructions gone over with patient and patient verbally expressed understanding. IV was taken out. All belongings were sent with the patient. Patient wheeled to medical mall by staff.

## 2021-12-01 NOTE — Progress Notes (Signed)
Physical Therapy Treatment Patient Details Name: Janet Galvan MRN: [id number] DOB: 08-25-1946 Today's Date: 12/01/2021   History of Present Illness Patient admitted for R TKA. PMH includes L TKA, back surgery, obesity, HTN, depression, GERD, Hypothyroidism    PT Comments    Pt seen for PT tx with pt agreeable. Pt is able to complete sit>stand from recliner & ambulate slightly longer distances in hallway (today compared to yesterday) with RW & supervision. Pt endorses ongoing significant pain in R knee but reports improvement in nausea with mobility. Will continue to follow pt acutely to progress mobility as able. Did discuss use of chair once inside the house for rest breaks as needed as pt still only ambulating household distances.     Recommendations for follow up therapy are one component of a multi-disciplinary discharge planning process, led by the attending physician.  Recommendations may be updated based on patient status, additional functional criteria and insurance authorization.  Follow Up Recommendations  Home health PT     Assistance Recommended at Discharge Intermittent Supervision/Assistance  Equipment Recommendations  BSC/3in1;Other (comment) (bariatric youth RW)    Recommendations for Other Services       Precautions / Restrictions Precautions Precautions: Fall Restrictions Weight Bearing Restrictions: Yes RLE Weight Bearing: Weight bearing as tolerated     Mobility  Bed Mobility               General bed mobility comments: not observed, pt received & left sitting in recliner    Transfers Overall transfer level: Needs assistance Equipment used: Rolling walker (2 wheels) Transfers: Sit to/from Stand Sit to Stand: Supervision                Ambulation/Gait Ambulation/Gait assistance: Supervision Gait Distance (Feet): 65 Feet Assistive device: Rolling walker (2 wheels) Gait Pattern/deviations: Decreased step length - right;Decreased  step length - left;Decreased stride length;Decreased dorsiflexion - right;Decreased dorsiflexion - left;Decreased weight shift to right Gait velocity: decreased     General Gait Details: Slightly more weight shift to R LE during stance phase today compared to yesterday.   Stairs             Wheelchair Mobility    Modified Rankin (Stroke Patients Only)       Balance Overall balance assessment: Needs assistance Sitting-balance support: Feet supported Sitting balance-Leahy Scale: Good     Standing balance support: Bilateral upper extremity supported;During functional activity;Reliant on assistive device for balance Standing balance-Leahy Scale: Fair                              Cognition Arousal/Alertness: Awake/alert Behavior During Therapy: WFL for tasks assessed/performed;Flat affect Overall Cognitive Status: Within Functional Limits for tasks assessed                                 General Comments: Pleasant, wishes to do well in PT        Exercises Total Joint Exercises Long Arc Quad: AROM;Strengthening;Right;10 reps;Seated    General Comments        Pertinent Vitals/Pain Pain Assessment: 0-10 Pain Score: 9  Pain Location: R knee Pain Descriptors / Indicators: Sore;Discomfort Pain Intervention(s): Limited activity within patient's tolerance;Monitored during session;Premedicated before session    Home Living  Prior Function            PT Goals (current goals can now be found in the care plan section) Acute Rehab PT Goals Patient Stated Goal: to return home, decrease pain PT Goal Formulation: With patient Time For Goal Achievement: 12/13/21 Potential to Achieve Goals: Good Progress towards PT goals: Progressing toward goals    Frequency    BID      PT Plan Current plan remains appropriate    Co-evaluation              AM-PAC PT "6 Clicks" Mobility   Outcome  Measure  Help needed turning from your back to your side while in a flat bed without using bedrails?: None Help needed moving from lying on your back to sitting on the side of a flat bed without using bedrails?: A Little Help needed moving to and from a bed to a chair (including a wheelchair)?: A Little Help needed standing up from a chair using your arms (e.g., wheelchair or bedside chair)?: A Little Help needed to walk in hospital room?: A Little Help needed climbing 3-5 steps with a railing? : A Lot 6 Click Score: 18    End of Session Equipment Utilized During Treatment: Gait belt Activity Tolerance: Patient tolerated treatment well;Patient limited by pain Patient left: with chair alarm set;in chair;with call bell/phone within reach;with SCD's reapplied (wound vac & polar care donned) Nurse Communication: Mobility status PT Visit Diagnosis: Unsteadiness on feet (R26.81);Muscle weakness (generalized) (M62.81);Difficulty in walking, not elsewhere classified (R26.2);Pain;Other abnormalities of gait and mobility (R26.89) Pain - Right/Left: Right Pain - part of body: Knee     Time: 3817-7116 PT Time Calculation (min) (ACUTE ONLY): 16 min  Charges:  $Therapeutic Activity: 8-22 mins                     Aleda Grana, PT, DPT 12/01/21, 9:00 AM    Sandi Mariscal 12/01/2021, 8:59 AM

## 2021-12-01 NOTE — TOC Transition Note (Signed)
Transition of Care [facility]) - CM/SW Discharge Note   Patient Details  Name: Janet Galvan MRN: [id number] Date of Birth: 02/07/1946  Transition of Care [facility] CM/SW Contact:  Liliana Cline, LCSW Phone Number: 12/01/2021, 9:43 AM   Clinical Narrative:    Patient has orders to DC home today.  CSW notified [place] with [facility].   Final next level of care: Home w Home Health Services Barriers to Discharge: Barriers Resolved   Patient Goals and CMS Choice        Discharge Placement                       Discharge Plan and Services   Discharge Planning Services: CM Consult            DME Arranged: 3-N-1 DME Agency: [facility] Date DME Agency Contacted: 11/29/21 Time DME Agency Contacted: 1327 Representative spoke with at DME Agency: [facility] HH Arranged: PT HH Agency: [facility] Date [facility] Agency Contacted: 12/01/21 Time HH Agency Contacted: 1328 Representative spoke with at [facility] Agency: [place]  Social Determinants of Health (SDOH) Interventions     Readmission Risk Interventions No flowsheet data found.

## 2021-12-01 NOTE — Progress Notes (Signed)
Subjective: 2 Days Post-Op Procedure(s) (LRB): TOTAL KNEE ARTHROPLASTY (Right) Patient reports pain as moderate.   Patient is well, and has had no acute complaints or problems Plan is to go Home after hospital stay. Negative for chest pain and shortness of breath Fever: no Gastrointestinal:Positive for nausea this morning. Patient is passing gas without pain this AM.  Objective: Vital signs in last 24 hours: Temp:  [97.2 F (36.2 C)-98.5 F (36.9 C)] 98.5 F (36.9 C) (12/31 0333) Pulse Rate:  [62-83] 81 (12/31 0333) Resp:  [15-17] 16 (12/31 0333) BP: (101-130)/(48-57) 112/48 (12/31 0333) SpO2:  [97 %-100 %] 97 % (12/31 0333)  Intake/Output from previous day:  Intake/Output Summary (Last 24 hours) at 12/01/2021 0806 Last data filed at 11/30/2021 2218 Gross per 24 hour  Intake 360 ml  Output 175 ml  Net 185 ml    Intake/Output this shift: No intake/output data recorded.  Labs: Recent Labs    11/30/21 0713 12/01/21 0542  HGB 11.7* 10.4*   Recent Labs    11/30/21 0713 12/01/21 0542  WBC 7.8 9.0  RBC 3.92 3.57*  HCT 35.8* 32.1*  PLT 219 177   Recent Labs    11/30/21 0713 12/01/21 0542  NA 139 137  K 4.2 4.3  CL 108 108  CO2 23 23  BUN 16 17  CREATININE 0.83 0.89  GLUCOSE 129* 110*  CALCIUM 8.0* 8.3*   No results for input(s): LABPT, INR in the last 72 hours.  EXAM General - Patient is Alert, Appropriate, and Oriented Extremity - ABD soft Neurovascular intact Dorsiflexion/Plantar flexion intact No cellulitis present Compartment soft Dressing/Incision - clean, dry, no drainage, Provena intact to the right leg this AM without any drainage.  The Ace wrap was removed. Motor Function - intact, moving foot and toes well on exam.  The patient ambulated 55 feet with physical therapy. Abdomen soft with normal bowel sounds.  Past Medical History:  Diagnosis Date   Allergic rhinitis    Chronic kidney disease    Depression    GERD (gastroesophageal  reflux disease)    History of colon polyps    History of hiatal hernia    Hyperlipidemia    Hypertension    Hypothyroidism    Lymphedema of both lower extremities    Obesity    Osteoarthritis    Venous stasis     Assessment/Plan: 2 Days Post-Op Procedure(s) (LRB): TOTAL KNEE ARTHROPLASTY (Right) Principal Problem:   Status post total knee replacement using cement, right  Estimated body mass index is 45.24 kg/m as calculated from the following:   Height as of this encounter: 4\' 11"  (1.499 m).   Weight as of this encounter: 101.6 kg. Advance diet Up with therapy D/C IV fluids when tolerating PO intake.  Vitals reviewed this AM.  Hemoglobin 10.4. Patient is passing gas without pain this morning. Up with therapy today, patient has HHPT scheduled for tomorrow. Plan for d/c home today pending therapy.  DVT Prophylaxis - Foot Pumps and Eliquis Weight-Bearing as tolerated to right leg  PA-C [facility] Orthopa[facility] Surgery 12/01/2021, 8:06 AM

## 2022-02-22 ENCOUNTER — Other Ambulatory Visit: Payer: Self-pay | Admitting: Internal Medicine

## 2022-02-22 DIAGNOSIS — Z1231 Encounter for screening mammogram for malignant neoplasm of breast: Secondary | ICD-10-CM

## 2022-04-04 ENCOUNTER — Ambulatory Visit
Admission: RE | Admit: 2022-04-04 | Discharge: 2022-04-04 | Disposition: A | Discharge status: Home / Self Care | Payer: Medicare Other | Source: Ambulatory Visit | Attending: Internal Medicine | Admitting: Internal Medicine

## 2022-04-04 DIAGNOSIS — Z1231 Encounter for screening mammogram for malignant neoplasm of breast: Secondary | ICD-10-CM | POA: Diagnosis present

## 2022-04-30 ENCOUNTER — Encounter (INDEPENDENT_AMBULATORY_CARE_PROVIDER_SITE_OTHER): Payer: Self-pay | Admitting: Vascular Surgery

## 2022-05-28 ENCOUNTER — Ambulatory Visit (INDEPENDENT_AMBULATORY_CARE_PROVIDER_SITE_OTHER): Payer: Medicare Other | Admitting: Vascular Surgery

## 2022-05-28 ENCOUNTER — Encounter (INDEPENDENT_AMBULATORY_CARE_PROVIDER_SITE_OTHER): Payer: Self-pay | Admitting: Vascular Surgery

## 2022-05-28 VITALS — BP 157/75 | HR 78 | Resp 16 | Wt 228.6 lb

## 2022-05-28 DIAGNOSIS — I10 Essential (primary) hypertension: Secondary | ICD-10-CM

## 2022-05-28 DIAGNOSIS — M159 Polyosteoarthritis, unspecified: Secondary | ICD-10-CM | POA: Insufficient documentation

## 2022-05-28 DIAGNOSIS — N1831 Chronic kidney disease, stage 3a: Secondary | ICD-10-CM

## 2022-05-28 DIAGNOSIS — M158 Other polyosteoarthritis: Secondary | ICD-10-CM | POA: Diagnosis not present

## 2022-05-28 DIAGNOSIS — I890 Lymphedema, not elsewhere classified: Secondary | ICD-10-CM

## 2022-05-28 DIAGNOSIS — E785 Hyperlipidemia, unspecified: Secondary | ICD-10-CM | POA: Diagnosis not present

## 2022-05-28 DIAGNOSIS — J309 Allergic rhinitis, unspecified: Secondary | ICD-10-CM | POA: Insufficient documentation

## 2022-05-28 DIAGNOSIS — I878 Other specified disorders of veins: Secondary | ICD-10-CM | POA: Insufficient documentation

## 2022-05-28 NOTE — Progress Notes (Signed)
Patient ID: Janet Galvan, female   DOB: 1946-11-24, 76 y.o.   MRN: [id number]  Chief Complaint  Patient presents with   New Patient (Initial Visit)    Ref [facility] consult for lymphedema    HPI Janet Galvan is a 76 y.o. female.  I am asked to see the patient by Dr. Judithann Sheen for evaluation of leg swelling and lymphedema.  I saw her close to a decade ago for leg swelling and lymphedema issues.  She ultimately saw [place] as well as the lymphedema clinic and began getting manual massage and wraps.  After this get the swelling under good control, she transition to compression socks and these did well for several years.  Recently, over the past 6 months, leg swelling has recurred.  This largely became a major issue after her right knee replacement almost 6 months ago.  She could not do her wraps or wear her stockings as normal as her surgical incision was present in her swelling was too prominent to get her stockings on over the past couple of months.  The right leg is more severely affected of the 2 legs.  No open wounds or infection but it has become heavy and swollen and uncomfortable.  The left leg is swelling some as well.  No fevers or chills.     Past Medical History:  Diagnosis Date   Allergic rhinitis    Chronic kidney disease    Depression    GERD (gastroesophageal reflux disease)    History of colon polyps    History of hiatal hernia    Hyperlipidemia    Hypertension    Hypothyroidism    Lymphedema of both lower extremities    Obesity    Osteoarthritis    Venous stasis     Past Surgical History:  Procedure Laterality Date   ABDOMINAL HYSTERECTOMY     ANKLE ARTHROSCOPY     BACK SURGERY     CARPAL TUNNEL RELEASE Right    carpal tunnell release     done endoscopic   CATARACT EXTRACTION W/PHACO Right 09/28/2018   Procedure: CATARACT EXTRACTION PHACO AND INTRAOCULAR LENS PLACEMENT (IOC)  RIGHT;  Surgeon: Nevada Crane, MD;  Location: [facility];  Service:  Ophthalmology;  Laterality: Right;   CATARACT EXTRACTION W/PHACO Left 10/19/2018   Procedure: CATARACT EXTRACTION PHACO AND INTRAOCULAR LENS PLACEMENT (IOC) LEFT;  Surgeon: Nevada Crane, MD;  Location: [facility];  Service: Ophthalmology;  Laterality: Left;   CHOLECYSTECTOMY     COLONOSCOPY WITH PROPOFOL N/A 06/02/2018   Procedure: COLONOSCOPY WITH PROPOFOL;  Surgeon: Toledo, Boykin Nearing, MD;  Location: [facility];  Service: Gastroenterology;  Laterality: N/A;   ESOPHAGOGASTRODUODENOSCOPY N/A 06/20/2021   Procedure: ESOPHAGOGASTRODUODENOSCOPY (EGD);  Surgeon: Toledo, Boykin Nearing, MD;  Location: [facility];  Service: Gastroenterology;  Laterality: N/A;   ESOPHAGOGASTRODUODENOSCOPY (EGD) WITH PROPOFOL N/A 06/02/2018   Procedure: ESOPHAGOGASTRODUODENOSCOPY (EGD) WITH PROPOFOL;  Surgeon: Toledo, Boykin Nearing, MD;  Location: [facility];  Service: Gastroenterology;  Laterality: N/A;   EYE SURGERY Bilateral 2016   Cataracts   JOINT REPLACEMENT     TONSILLECTOMY     TOTAL KNEE ARTHROPLASTY Right 11/29/2021   Procedure: TOTAL KNEE ARTHROPLASTY;  Surgeon: Christena Flake, MD;  Location: [facility];  Service: Orthopedics;  Laterality: Right;   TOTAL SHOULDER REPLACEMENT Left    TUBAL LIGATION       Family History  Problem Relation Age of Onset   Breast cancer Maternal Aunt 48  No  bleeding or clotting disorders No aneurysms   Social History   Tobacco Use   Smoking status: Never   Smokeless tobacco: Never  Vaping Use   Vaping Use: Never used  Substance Use Topics   Alcohol use: Not Currently   Drug use: Never     Allergies  Allergen Reactions   Latex Rash and Other (See Comments)    Compression stockings cause blisters.  Bandaids cause red skin    Morphine Nausea Only    Other reaction(s): Vomiting   Ciprofloxacin Rash and Other (See Comments)    Blisters    Oxycodone Nausea Only   Ketoprofen Nausea Only   Morphine And Related Other (See Comments)    "crazy"  - altered mental status   Amoxicillin Rash   Amoxicillin-Pot Clavulanate Nausea Only and Rash   Bactrim [Sulfamethoxazole-Trimethoprim] Rash   Shrimp [Shellfish Allergy] Rash    Current Outpatient Medications  Medication Sig Dispense Refill   acetaminophen (TYLENOL) 650 MG CR tablet Take 1,300 mg by mouth every 8 (eight) hours as needed for pain.     Biotin 5000 MCG TABS Take 5,000 mcg by mouth daily.     CRANBERRY-VITAMIN C PO Take 1 tablet by mouth in the morning and at bedtime.     diclofenac sodium (VOLTAREN) 1 % GEL Apply 2 g topically 2 (two) times daily as needed (pain).     DULoxetine (CYMBALTA) 30 MG capsule Take 30 mg by mouth daily.     estradiol (ESTRACE) 1 MG tablet Take 1 mg by mouth daily.     gabapentin (NEURONTIN) 100 MG capsule Take 300 mg by mouth at bedtime.     levothyroxine (SYNTHROID) 100 MCG tablet Take 100 mcg by mouth daily.     pantoprazole (PROTONIX) 40 MG tablet Take 40 mg by mouth at bedtime.     rosuvastatin (CRESTOR) 20 MG tablet Take 20 mg by mouth daily.     traMADol (ULTRAM) 50 MG tablet Take 1 tablet (50 mg total) by mouth every 8 (eight) hours as needed for moderate pain. 30 tablet 0   TURMERIC CURCUMIN PO Take 1 capsule by mouth daily.     apixaban (ELIQUIS) 2.5 MG TABS tablet Take 1 tablet (2.5 mg total) by mouth 2 (two) times daily. 30 tablet 0   HYDROcodone-acetaminophen (NORCO/VICODIN) 5-325 MG tablet Take 1-2 tablets by mouth every 6 (six) hours as needed for severe pain. 60 tablet 0   ondansetron (ZOFRAN) 4 MG tablet Take 1 tablet (4 mg total) by mouth every 6 (six) hours as needed for nausea. 30 tablet 0   No current facility-administered medications for this visit.      REVIEW OF SYSTEMS (Negative unless checked)  Constitutional: [] Weight loss  [] Fever  [] Chills Cardiac: [] Chest pain   [] Chest pressure   [] Palpitations   [] Shortness of breath when laying flat   [] Shortness of breath at rest   [] Shortness of breath with  exertion. Vascular:  [] Pain in legs with walking   [] Pain in legs at rest   [] Pain in legs when laying flat   [] Claudication   [] Pain in feet when walking  [] Pain in feet at rest  [] Pain in feet when laying flat   [] History of DVT   [] Phlebitis   [x] Swelling in legs   [] Varicose veins   [] Non-healing ulcers Pulmonary:   [] Uses home oxygen   [] Productive cough   [] Hemoptysis   [] Wheeze  [] COPD   [] Asthma Neurologic:  [] Dizziness  [] Blackouts   [] Seizures   []   History of stroke   [] History of TIA  [] Aphasia   [] Temporary blindness   [] Dysphagia   [] Weakness or numbness in arms   [] Weakness or numbness in legs Musculoskeletal:  [x] Arthritis   [] Joint swelling   [] Joint pain   [] Low back pain Hematologic:  [] Easy bruising  [] Easy bleeding   [] Hypercoagulable state   [] Anemic  [] Hepatitis Gastrointestinal:  [] Blood in stool   [] Vomiting blood  [] Gastroesophageal reflux/heartburn   [] Abdominal pain Genitourinary:  [] Chronic kidney disease   [] Difficult urination  [] Frequent urination  [] Burning with urination   [] Hematuria Skin:  [] Rashes   [] Ulcers   [] Wounds Psychological:  [] History of anxiety   []  History of major depression.    Physical Exam BP (!) 157/75 (BP Location: Right Arm)   Pulse 78   Resp 16   Wt 228 lb 9.6 oz (103.7 kg)   LMP  (LMP Unknown)   BMI 46.17 kg/m  Gen:  WD/WN, NAD Head: [place]/AT, No temporalis wasting. Ear/Nose/Throat: Hearing grossly intact, nares w/o erythema or drainage, oropharynx w/o Erythema/Exudate Eyes: Conjunctiva clear, sclera non-icteric  Neck: trachea midline.  No JVD.  Pulmonary:  Good air movement, respirations not labored, no use of accessory muscles  Cardiac: RRR, no JVD Vascular:  Vessel Right Left  Radial Palpable Palpable                                   Gastrointestinal:. No masses, surgical incisions, or scars. Musculoskeletal: M/S 5/5 throughout.  Extremities without ischemic changes.  No deformity or atrophy.  2-3+ right lower extremity  edema, 1-2+ left lower extremity edema. Neurologic: Sensation grossly intact in extremities.  Symmetrical.  Speech is fluent. Motor exam as listed above. Psychiatric: Judgment intact, Mood & affect appropriate for pt's clinical situation. Dermatologic: No rashes or ulcers noted.  No cellulitis or open wounds.    Radiology No results found.  Labs No results found for this or any previous visit (from the past 2160 hour(s)).  Assessment/Plan:  Lymphedema of both lower extremities The patient has prominent lymphedema of the lower extremities most notable on the right side.  This is gotten significantly exacerbated after knee replacement which is not entirely surprising given the normal healing and difficulties in a patient with known lymphedema.  Fortunately at this point, her knee replacement is healed well and we can start getting her back into compression wraps and she can be more active and elevate her legs more.  We placed her in a 3 layer Unna boot today and this will be changed weekly over the next few weeks to see if we get the swelling down to a point where we can get her in regular compression socks.  We can also repeat a venous duplex, but I think this is less likely a problem as she has known lymphedema prior to the situation that was well controlled.  For this reason, a referral back to the lymphedema clinic is likely a prudent and helpful thing as well.  We will try to get this arranged in the near future as she had great success with them before.  We will see her back in a few weeks.  Hyperlipidemia lipid control important in reducing the progression of atherosclerotic disease. Continue statin therapy   CKD (chronic kidney disease), stage IIIa Can certainly worsen LE edema.  Osteoarthritis of multiple joints Recent right knee replacement has exacerbated her swelling but this is now healed  and she has finished her rehabilitation.  Hypertension blood pressure control important  in reducing the progression of atherosclerotic disease. On appropriate oral medications.      Festus Barren 05/29/2022, 5:21 PM   This note was created with Dragon medical transcription system.  Any errors from dictation are unintentional.

## 2022-05-29 NOTE — Assessment & Plan Note (Signed)
The patient has prominent lymphedema of the lower extremities most notable on the right side.  This is gotten significantly exacerbated after knee replacement which is not entirely surprising given the normal healing and difficulties in a patient with known lymphedema.  Fortunately at this point, her knee replacement is healed well and we can start getting her back into compression wraps and she can be more active and elevate her legs more.  We placed her in a 3 layer Unna boot today and this will be changed weekly over the next few weeks to see if we get the swelling down to a point where we can get her in regular compression socks.  We can also repeat a venous duplex, but I think this is less likely a problem as she has known lymphedema prior to the situation that was well controlled.  For this reason, a referral back to the lymphedema clinic is likely a prudent and helpful thing as well.  We will try to get this arranged in the near future as she had great success with them before.  We will see her back in a few weeks.

## 2022-05-29 NOTE — Assessment & Plan Note (Signed)
lipid control important in reducing the progression of atherosclerotic disease. Continue statin therapy  

## 2022-05-29 NOTE — Assessment & Plan Note (Signed)
blood pressure control important in reducing the progression of atherosclerotic disease. On appropriate oral medications.  

## 2022-05-29 NOTE — Assessment & Plan Note (Signed)
Recent right knee replacement has exacerbated her swelling but this is now healed and she has finished her rehabilitation.

## 2022-05-29 NOTE — Assessment & Plan Note (Signed)
Can certainly worsen LE edema.

## 2022-06-06 ENCOUNTER — Ambulatory Visit (INDEPENDENT_AMBULATORY_CARE_PROVIDER_SITE_OTHER): Payer: Medicare Other | Admitting: Nurse Practitioner

## 2022-06-06 ENCOUNTER — Encounter (INDEPENDENT_AMBULATORY_CARE_PROVIDER_SITE_OTHER): Payer: Self-pay

## 2022-06-06 VITALS — BP 139/69 | HR 98 | Resp 18 | Ht 60.0 in | Wt 229.0 lb

## 2022-06-06 DIAGNOSIS — I890 Lymphedema, not elsewhere classified: Secondary | ICD-10-CM | POA: Diagnosis not present

## 2022-06-08 ENCOUNTER — Encounter (INDEPENDENT_AMBULATORY_CARE_PROVIDER_SITE_OTHER): Payer: Self-pay | Admitting: Nurse Practitioner

## 2022-06-13 ENCOUNTER — Ambulatory Visit (INDEPENDENT_AMBULATORY_CARE_PROVIDER_SITE_OTHER): Payer: Medicare Other | Admitting: Nurse Practitioner

## 2022-06-13 ENCOUNTER — Encounter (INDEPENDENT_AMBULATORY_CARE_PROVIDER_SITE_OTHER): Payer: Self-pay

## 2022-06-13 VITALS — BP 155/68 | HR 78 | Resp 16 | Wt 227.0 lb

## 2022-06-13 DIAGNOSIS — I890 Lymphedema, not elsewhere classified: Secondary | ICD-10-CM | POA: Diagnosis not present

## 2022-06-13 NOTE — Progress Notes (Signed)
History of Present Illness  There is no documented history at this time  Assessments & Plan   There are no diagnoses linked to this encounter.    Additional instructions  Subjective:  Patient presents with venous ulcer of the Bilateral lower extremity.    Procedure:  3 layer unna wrap was placed Bilateral lower extremity.   Plan:   Follow up in one week.  

## 2022-06-17 ENCOUNTER — Encounter (INDEPENDENT_AMBULATORY_CARE_PROVIDER_SITE_OTHER): Payer: Self-pay | Admitting: Nurse Practitioner

## 2022-06-20 ENCOUNTER — Ambulatory Visit (INDEPENDENT_AMBULATORY_CARE_PROVIDER_SITE_OTHER): Payer: Medicare Other | Admitting: Nurse Practitioner

## 2022-06-20 ENCOUNTER — Encounter (INDEPENDENT_AMBULATORY_CARE_PROVIDER_SITE_OTHER): Payer: Self-pay

## 2022-06-20 VITALS — BP 156/82 | HR 70 | Resp 17

## 2022-06-20 DIAGNOSIS — I890 Lymphedema, not elsewhere classified: Secondary | ICD-10-CM

## 2022-06-20 NOTE — Progress Notes (Signed)
History of Present Illness  There is no documented history at this time  Assessments & Plan   There are no diagnoses linked to this encounter.    Additional instructions  Subjective:  Patient presents with venous ulcer of the Bilateral lower extremity.    Procedure:  3 layer unna wrap was placed Bilateral lower extremity.   Plan:   Follow up in one week.  

## 2022-06-25 ENCOUNTER — Ambulatory Visit (INDEPENDENT_AMBULATORY_CARE_PROVIDER_SITE_OTHER): Payer: Medicare Other | Admitting: Vascular Surgery

## 2022-06-27 ENCOUNTER — Encounter (INDEPENDENT_AMBULATORY_CARE_PROVIDER_SITE_OTHER): Payer: Self-pay | Admitting: Nurse Practitioner

## 2022-06-27 ENCOUNTER — Ambulatory Visit (INDEPENDENT_AMBULATORY_CARE_PROVIDER_SITE_OTHER): Payer: Medicare Other | Admitting: Nurse Practitioner

## 2022-06-27 VITALS — BP 164/73 | HR 78 | Resp 16 | Wt 226.2 lb

## 2022-06-27 DIAGNOSIS — E785 Hyperlipidemia, unspecified: Secondary | ICD-10-CM | POA: Diagnosis not present

## 2022-06-27 DIAGNOSIS — I890 Lymphedema, not elsewhere classified: Secondary | ICD-10-CM | POA: Diagnosis not present

## 2022-06-27 DIAGNOSIS — I10 Essential (primary) hypertension: Secondary | ICD-10-CM | POA: Diagnosis not present

## 2022-07-07 ENCOUNTER — Encounter (INDEPENDENT_AMBULATORY_CARE_PROVIDER_SITE_OTHER): Payer: Self-pay | Admitting: Nurse Practitioner

## 2022-07-15 ENCOUNTER — Encounter (INDEPENDENT_AMBULATORY_CARE_PROVIDER_SITE_OTHER): Payer: Self-pay | Admitting: Nurse Practitioner

## 2022-07-15 NOTE — Progress Notes (Signed)
Subjective:    Patient ID: Janet Galvan, female    DOB: 10/25/1946, 76 y.o.   MRN: [id number] Chief Complaint  Patient presents with   Follow-up    Unna boot follow up    Janet Galvan is a 76 y.o. female.  She returns today for evaluation of her lower extremity leg swelling and lymphedema.  She had previously seen is overtaking to the wound and she had been having good control of her lower extremity edema.  She did very well with lymphedema.  About 6 months ago she began to have worsening swelling following the knee replacement.  She was unable to use her normal compression garments.  She has been in [employer] and has been very helpful for her in gaining control of her lower extremity edema.  She denies any further open wounds or ulcerations.     Review of Systems  Cardiovascular:  Positive for leg swelling.  All other systems reviewed and are negative.      Objective:   Physical Exam Vitals reviewed.  HENT:     Head: Normocephalic.  Cardiovascular:     Rate and Rhythm: Normal rate.     Pulses: Normal pulses.  Pulmonary:     Effort: Pulmonary effort is normal.  Musculoskeletal:     Right lower leg: Edema present.     Left lower leg: Edema present.  Skin:    General: Skin is warm and dry.  Neurological:     Mental Status: She is alert and oriented to person, place, and time.  Psychiatric:        Mood and Affect: Mood normal.        Behavior: Behavior normal.        Thought Content: Thought content normal.        Judgment: Judgment normal.     BP (!) 164/73 (BP Location: Right Arm)   Pulse 78   Resp 16   Wt 226 lb 3.2 oz (102.6 kg)   LMP  (LMP Unknown)   BMI 44.18 kg/m   Past Medical History:  Diagnosis Date   Allergic rhinitis    Chronic kidney disease    Depression    GERD (gastroesophageal reflux disease)    History of colon polyps    History of hiatal hernia    Hyperlipidemia    Hypertension    Hypothyroidism    Lymphedema of both lower  extremities    Obesity    Osteoarthritis    Venous stasis     Social History   Socioeconomic History   Marital status: Divorced    Spouse name: Not on file   Number of children: Not on file   Years of education: Not on file   Highest education level: Not on file  Occupational History   Not on file  Tobacco Use   Smoking status: Never   Smokeless tobacco: Never  Vaping Use   Vaping Use: Never used  Substance and Sexual Activity   Alcohol use: Not Currently   Drug use: Never   Sexual activity: Not on file  Other Topics Concern   Not on file  Social History Narrative   Not on file   Social Determinants of Health   Financial Resource Strain: Not on file  Food Insecurity: Not on file  Transportation Needs: Not on file  Physical Activity: Not on file  Stress: Not on file  Social Connections: Not on file  Intimate Partner Violence: Not on file  Past Surgical History:  Procedure Laterality Date   ABDOMINAL HYSTERECTOMY     ANKLE ARTHROSCOPY     BACK SURGERY     CARPAL TUNNEL RELEASE Right    carpal tunnell release     done endoscopic   CATARACT EXTRACTION W/PHACO Right 09/28/2018   Procedure: CATARACT EXTRACTION PHACO AND INTRAOCULAR LENS PLACEMENT (IOC)  RIGHT;  Surgeon: Nevada Crane, MD;  Location: [facility];  Service: Ophthalmology;  Laterality: Right;   CATARACT EXTRACTION W/PHACO Left 10/19/2018   Procedure: CATARACT EXTRACTION PHACO AND INTRAOCULAR LENS PLACEMENT (IOC) LEFT;  Surgeon: Nevada Crane, MD;  Location: [facility];  Service: Ophthalmology;  Laterality: Left;   CHOLECYSTECTOMY     COLONOSCOPY WITH PROPOFOL N/A 06/02/2018   Procedure: COLONOSCOPY WITH PROPOFOL;  Surgeon: Toledo, Boykin Nearing, MD;  Location: [facility];  Service: Gastroenterology;  Laterality: N/A;   ESOPHAGOGASTRODUODENOSCOPY N/A 06/20/2021   Procedure: ESOPHAGOGASTRODUODENOSCOPY (EGD);  Surgeon: Toledo, Boykin Nearing, MD;  Location: [facility];   Service: Gastroenterology;  Laterality: N/A;   ESOPHAGOGASTRODUODENOSCOPY (EGD) WITH PROPOFOL N/A 06/02/2018   Procedure: ESOPHAGOGASTRODUODENOSCOPY (EGD) WITH PROPOFOL;  Surgeon: Toledo, Boykin Nearing, MD;  Location: [facility];  Service: Gastroenterology;  Laterality: N/A;   EYE SURGERY Bilateral 2016   Cataracts   JOINT REPLACEMENT     TONSILLECTOMY     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  Bandaids cause red skin    Morphine Nausea Only    Other reaction(s): Vomiting   Ciprofloxacin Rash and Other (See Comments)    Blisters    Oxycodone Nausea Only   Ketoprofen Nausea Only   Morphine And Related Other (See Comments)    "crazy" - altered mental status   Amoxicillin Rash   Amoxicillin-Pot Clavulanate Nausea Only and Rash   Bactrim [Sulfamethoxazole-Trimethoprim] Rash   Shrimp [Shellfish Allergy] Rash       Latest Ref Rng & Units 12/01/2021    5:42 AM 11/30/2021    7:13 AM 11/20/2021    9:17 AM  CBC  WBC 4.0 - 10.5 K/uL 9.0  7.8  4.9   Hemoglobin 12.0 - 15.0 g/dL 10.9  32.3  55.7   Hematocrit 36.0 - 46.0 % 32.1  35.8  38.4   Platelets 150 - 400 K/uL 177  219  203       CMP     Component Value Date/Time   NA 137 12/01/2021 0542   K 4.3 12/01/2021 0542   CL 108 12/01/2021 0542   CO2 23 12/01/2021 0542   GLUCOSE 110 (H) 12/01/2021 0542   BUN 17 12/01/2021 0542   CREATININE 0.89 12/01/2021 0542   CALCIUM 8.3 (L) 12/01/2021 0542   PROT 7.3 11/20/2021 0917   ALBUMIN 4.1 11/20/2021 0917   AST 18 11/20/2021 0917   ALT 10 11/20/2021 0917   ALKPHOS 72 11/20/2021 0917   BILITOT 0.8  11/20/2021 0917   GFRNONAA >60 12/01/2021 0542     No results found.     Assessment & Plan:   1. Lymphedema of both lower extremities Recommend:  I have  had a long discussion with the patient regarding swelling and why it  causes symptoms.  Patient will begin wearing graduated compression on a daily basis a prescription was given. The patient will  wear the stockings first thing in the morning and removing them in the evening. The patient is instructed specifically not to sleep in the stockings.   In addition, behavioral modification will be initiated.  This will include frequent elevation, use of over the counter pain medications and exercise such as walking.  Consideration for a lymph pump will also be made based upon the effectiveness of conservative therapy.  This would help to improve the edema control and prevent sequela such as ulcers and infections   Patient should undergo duplex ultrasound of the venous system to ensure that DVT or reflux is not present.  The patient will follow-up with me after the ultrasound.    2. Hyperlipidemia, unspecified hyperlipidemia type Continue statin as ordered and reviewed, no changes at this time   3. Primary hypertension Continue antihypertensive medications as already ordered, these medications have been reviewed and there are no changes at this time.    Current Outpatient Medications on File Prior to Visit  Medication Sig Dispense Refill   acetaminophen (TYLENOL) 650 MG CR tablet Take 1,300 mg by mouth every 8 (eight) hours as needed for pain.     Biotin 5000 MCG TABS Take 5,000 mcg by mouth daily.     CRANBERRY-VITAMIN C PO Take 1 tablet by mouth in the morning and at bedtime.     diclofenac sodium (VOLTAREN) 1 % GEL Apply 2 g topically 2 (two) times daily as needed (pain).     DULoxetine (CYMBALTA) 30 MG capsule Take 30 mg by mouth daily.     estradiol (ESTRACE) 1 MG tablet Take 1 mg by mouth daily.     gabapentin (NEURONTIN) 100 MG  capsule Take 300 mg by mouth at bedtime.     levothyroxine (SYNTHROID) 100 MCG tablet Take 100 mcg by mouth daily.     pantoprazole (PROTONIX) 40 MG tablet Take 40 mg by mouth at bedtime.     rosuvastatin (CRESTOR) 20 MG tablet Take 20 mg by mouth daily.     traMADol (ULTRAM) 50 MG tablet Take 1 tablet (50 mg total) by mouth every 8 (eight) hours as needed for moderate pain. 30 tablet 0   TURMERIC CURCUMIN PO Take 1 capsule by mouth daily.     No current facility-administered medications on file prior to visit.    There are no Patient Instructions on file for this visit. No follow-ups on file.   Georgiana Spinner, NP

## 2022-08-26 NOTE — Progress Notes (Unsigned)
History of Present Illness  There is no documented history at this time  Assessments & Plan   1. Lymphedema of both lower extremities ***     Additional instructions  Subjective:  Patient presents with venous ulcer of the Bilateral lower extremity.    Procedure:  3 layer unna wrap was placed Bilateral lower extremity.   Plan:   Follow up in one week.

## 2022-09-27 ENCOUNTER — Encounter (INDEPENDENT_AMBULATORY_CARE_PROVIDER_SITE_OTHER): Payer: Self-pay | Admitting: Nurse Practitioner

## 2022-09-27 ENCOUNTER — Ambulatory Visit (INDEPENDENT_AMBULATORY_CARE_PROVIDER_SITE_OTHER): Payer: Medicare Other | Admitting: Nurse Practitioner

## 2022-09-27 VITALS — BP 152/78 | HR 72 | Resp 16 | Wt 237.2 lb

## 2022-09-27 DIAGNOSIS — N1831 Chronic kidney disease, stage 3a: Secondary | ICD-10-CM | POA: Diagnosis not present

## 2022-09-27 DIAGNOSIS — I10 Essential (primary) hypertension: Secondary | ICD-10-CM

## 2022-09-27 DIAGNOSIS — I890 Lymphedema, not elsewhere classified: Secondary | ICD-10-CM

## 2022-09-30 ENCOUNTER — Encounter (INDEPENDENT_AMBULATORY_CARE_PROVIDER_SITE_OTHER): Payer: Self-pay

## 2022-10-06 ENCOUNTER — Encounter (INDEPENDENT_AMBULATORY_CARE_PROVIDER_SITE_OTHER): Payer: Self-pay | Admitting: Nurse Practitioner

## 2022-10-06 NOTE — Progress Notes (Signed)
Subjective:    Patient ID: Janet Galvan, female    DOB: June 19, 1946, 76 y.o.   MRN: [id number] Chief Complaint  Patient presents with   Follow-up    3 Month follow up     Janet Galvan is a 76 y.o. female.  She returns today for evaluation of her lower extremity leg swelling and lymphedema.  She had previously seen is overtaking to the wound and she had been having good control of her lower extremity edema.  She did very well with lymphedema.  About 6 months ago she began to have worsening swelling following the knee replacement.  She was unable to use her normal compression garments.  She was previously in [employer] and this helped with control.  However for the last 3 months she has been maintained with medical grade compression she has been doing well.  Her swelling is having very good control today.    Review of Systems  Cardiovascular:  Positive for leg swelling.  All other systems reviewed and are negative.      Objective:   Physical Exam Vitals reviewed.  HENT:     Head: Normocephalic.  Cardiovascular:     Rate and Rhythm: Normal rate and regular rhythm.  Pulmonary:     Effort: Pulmonary effort is normal.  Skin:    General: Skin is warm and dry.  Neurological:     Mental Status: She is alert and oriented to person, place, and time.  Psychiatric:        Mood and Affect: Mood normal.        Behavior: Behavior normal.        Thought Content: Thought content normal.        Judgment: Judgment normal.     BP (!) 152/78 (BP Location: Left Arm)   Pulse 72   Resp 16   Wt 237 lb 3.2 oz (107.6 kg)   LMP  (LMP Unknown)   BMI 46.32 kg/m   Past Medical History:  Diagnosis Date   Allergic rhinitis    Chronic kidney disease    Depression    GERD (gastroesophageal reflux disease)    History of colon polyps    History of hiatal hernia    Hyperlipidemia    Hypertension    Hypothyroidism    Lymphedema of both lower extremities    Obesity    Osteoarthritis     Venous stasis     Social History   Socioeconomic History   Marital status: Divorced    Spouse name: Not on file   Number of children: Not on file   Years of education: Not on file   Highest education level: Not on file  Occupational History   Not on file  Tobacco Use   Smoking status: Never   Smokeless tobacco: Never  Vaping Use   Vaping Use: Never used  Substance and Sexual Activity   Alcohol use: Not Currently   Drug use: Never   Sexual activity: Not on file  Other Topics Concern   Not on file  Social History Narrative   Not on file   Social Determinants of Health   Financial Resource Strain: Not on file  Food Insecurity: Not on file  Transportation Needs: Not on file  Physical Activity: Not on file  Stress: Not on file  Social Connections: Not on file  Intimate Partner Violence: Not on file    Past Surgical History:  Procedure Laterality Date   ABDOMINAL HYSTERECTOMY  ANKLE ARTHROSCOPY     BACK SURGERY     CARPAL TUNNEL RELEASE Right    carpal tunnell release     done endoscopic   CATARACT EXTRACTION W/PHACO Right 09/28/2018   Procedure: CATARACT EXTRACTION PHACO AND INTRAOCULAR LENS PLACEMENT (IOC)  RIGHT;  Surgeon: Nevada Crane, MD;  Location: [facility];  Service: Ophthalmology;  Laterality: Right;   CATARACT EXTRACTION W/PHACO Left 10/19/2018   Procedure: CATARACT EXTRACTION PHACO AND INTRAOCULAR LENS PLACEMENT (IOC) LEFT;  Surgeon: Nevada Crane, MD;  Location: [facility];  Service: Ophthalmology;  Laterality: Left;   CHOLECYSTECTOMY     COLONOSCOPY WITH PROPOFOL N/A 06/02/2018   Procedure: COLONOSCOPY WITH PROPOFOL;  Surgeon: Toledo, Boykin Nearing, MD;  Location: [facility];  Service: Gastroenterology;  Laterality: N/A;   ESOPHAGOGASTRODUODENOSCOPY N/A 06/20/2021   Procedure: ESOPHAGOGASTRODUODENOSCOPY (EGD);  Surgeon: Toledo, Boykin Nearing, MD;  Location: [facility];  Service: Gastroenterology;  Laterality: N/A;    ESOPHAGOGASTRODUODENOSCOPY (EGD) WITH PROPOFOL N/A 06/02/2018   Procedure: ESOPHAGOGASTRODUODENOSCOPY (EGD) WITH PROPOFOL;  Surgeon: Toledo, Boykin Nearing, MD;  Location: [facility];  Service: Gastroenterology;  Laterality: N/A;   EYE SURGERY Bilateral 2016   Cataracts   JOINT REPLACEMENT     TONSILLECTOMY     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  Bandaids cause red skin    Morphine Nausea Only    Other reaction(s): Vomiting   Ciprofloxacin Rash and Other (See Comments)    Blisters    Oxycodone Nausea Only   Ketoprofen Nausea Only   Morphine And Related Other (See Comments)    "crazy" - altered mental status   Amoxicillin Rash   Amoxicillin-Pot Clavulanate Nausea Only and Rash   Bactrim [Sulfamethoxazole-Trimethoprim] Rash   Shrimp [Shellfish Allergy] Rash       Latest Ref Rng & Units 12/01/2021    5:42 AM 11/30/2021    7:13 AM 11/20/2021    9:17 AM  CBC  WBC 4.0 - 10.5 K/uL 9.0  7.8  4.9   Hemoglobin 12.0 - 15.0 g/dL 50.9  32.6  71.2   Hematocrit 36.0 - 46.0 % 32.1  35.8  38.4   Platelets 150 - 400 K/uL 177  219  203       CMP     Component Value Date/Time   NA 137 12/01/2021 0542   K 4.3 12/01/2021 0542   CL 108 12/01/2021 0542   CO2 23 12/01/2021 0542   GLUCOSE 110 (H) 12/01/2021 0542   BUN 17 12/01/2021 0542   CREATININE 0.89 12/01/2021 0542   CALCIUM 8.3 (L) 12/01/2021 0542   PROT 7.3 11/20/2021 0917   ALBUMIN 4.1 11/20/2021 0917   AST 18 11/20/2021 0917   ALT 10 11/20/2021 0917   ALKPHOS 72 11/20/2021 0917   BILITOT 0.8 11/20/2021 0917   GFRNONAA >60 12/01/2021 0542      No results found.     Assessment & Plan:   1. Lymphedema of both lower extremities Recommend:  No surgery or intervention at this point in time.    I have reviewed my discussion  with the patient regarding lymphedema and why it  causes symptoms.  Patient will continue wearing graduated compression on a daily basis. The patient should put the compression on first thing in the morning and removing them in the evening. The patient should not sleep in the compression.   In addition, behavioral modification throughout the day will be continued.  This will include frequent elevation (such as in a recliner), use of over the counter pain medications as needed and exercise such as walking.  The systemic causes for chronic edema such as liver, kidney and cardiac etiologies does not appear to have significant changed over the past year.     The patient will follow-up with me on an annual basis.   2. Primary hypertension Continue antihypertensive medications as already ordered, these medications have been reviewed and there are no changes at this time.  3. Stage 3a chronic kidney disease (HCC) This may play a component of the patient's edema.   Current Outpatient Medications on File Prior to Visit  Medication Sig Dispense Refill   acetaminophen (TYLENOL) 650 MG CR tablet Take 1,300 mg by mouth every 8 (eight) hours as needed for pain.     Biotin 5000 MCG TABS Take 5,000 mcg by mouth daily.     CRANBERRY-VITAMIN C PO Take 1 tablet by mouth in the morning and at bedtime.     diclofenac sodium (VOLTAREN) 1 % GEL Apply 2 g topically 2 (two) times daily as needed (pain).     DULoxetine (CYMBALTA) 30 MG capsule Take 30 mg by mouth daily.     estradiol (ESTRACE) 1 MG tablet Take 1 mg by mouth daily.     gabapentin (NEURONTIN) 100 MG capsule Take 300 mg by mouth at bedtime.     levothyroxine (SYNTHROID) 100 MCG tablet Take 100 mcg by mouth daily.     pantoprazole (PROTONIX) 40 MG tablet Take  40 mg by mouth at bedtime.     rosuvastatin (CRESTOR) 20 MG tablet Take 20 mg by mouth daily.     traMADol (ULTRAM) 50 MG tablet Take 1 tablet (50 mg total) by mouth every 8 (eight) hours as needed for moderate pain. 30 tablet 0   TURMERIC CURCUMIN PO Take 1 capsule by mouth daily.     No current facility-administered medications on file prior to visit.    There are no Patient Instructions on file for this visit. No follow-ups on file.   Georgiana Spinner, NP

## 2023-01-31 ENCOUNTER — Other Ambulatory Visit: Payer: Self-pay | Admitting: Internal Medicine

## 2023-01-31 DIAGNOSIS — Z1231 Encounter for screening mammogram for malignant neoplasm of breast: Secondary | ICD-10-CM

## 2023-02-12 ENCOUNTER — Other Ambulatory Visit: Payer: Self-pay | Admitting: Physical Medicine & Rehabilitation

## 2023-02-12 DIAGNOSIS — M5416 Radiculopathy, lumbar region: Secondary | ICD-10-CM

## 2023-02-22 ENCOUNTER — Ambulatory Visit
Admission: RE | Admit: 2023-02-22 | Discharge: 2023-02-22 | Disposition: A | Discharge status: Home / Self Care | Payer: Medicare Other | Source: Ambulatory Visit | Attending: Physical Medicine & Rehabilitation | Admitting: Physical Medicine & Rehabilitation

## 2023-02-22 DIAGNOSIS — M5416 Radiculopathy, lumbar region: Secondary | ICD-10-CM

## 2023-04-07 ENCOUNTER — Ambulatory Visit
Admission: RE | Admit: 2023-04-07 | Discharge: 2023-04-07 | Disposition: A | Discharge status: Home / Self Care | Payer: Medicare Other | Source: Ambulatory Visit | Attending: Internal Medicine | Admitting: Internal Medicine

## 2023-04-07 DIAGNOSIS — Z1231 Encounter for screening mammogram for malignant neoplasm of breast: Secondary | ICD-10-CM | POA: Diagnosis not present

## 2023-09-25 ENCOUNTER — Encounter (INDEPENDENT_AMBULATORY_CARE_PROVIDER_SITE_OTHER): Payer: Self-pay | Admitting: Nurse Practitioner

## 2023-09-25 ENCOUNTER — Ambulatory Visit (INDEPENDENT_AMBULATORY_CARE_PROVIDER_SITE_OTHER): Payer: Medicare Other | Admitting: Nurse Practitioner

## 2023-09-25 VITALS — BP 135/86 | HR 90 | Resp 16 | Wt 208.0 lb

## 2023-09-25 DIAGNOSIS — I890 Lymphedema, not elsewhere classified: Secondary | ICD-10-CM

## 2023-09-25 DIAGNOSIS — I10 Essential (primary) hypertension: Secondary | ICD-10-CM | POA: Diagnosis not present

## 2023-09-25 DIAGNOSIS — N1831 Chronic kidney disease, stage 3a: Secondary | ICD-10-CM | POA: Diagnosis not present

## 2023-10-06 ENCOUNTER — Encounter (INDEPENDENT_AMBULATORY_CARE_PROVIDER_SITE_OTHER): Payer: Self-pay | Admitting: Nurse Practitioner

## 2023-10-06 NOTE — Progress Notes (Signed)
Subjective:    Patient ID: Janet Galvan, female    DOB: 1946/04/04, 77 y.o.   MRN: [id number] Chief Complaint  Patient presents with   Follow-up    75yr follow up    Janet Galvan is a 77 y.o. female.  She returns today for evaluation of her lower extremity leg swelling and lymphedema.  Currently she has been doing very well with her lower extremity edema.  There has been no episodes of cellulitis.  There is currently no open wounds or ulcerations or weeping of the lower extremities.  She is doing very well with elevation and activity    Review of Systems  Cardiovascular:  Positive for leg swelling.  All other systems reviewed and are negative.      Objective:   Physical Exam Vitals reviewed.  HENT:     Head: Normocephalic.  Cardiovascular:     Rate and Rhythm: Normal rate and regular rhythm.  Pulmonary:     Effort: Pulmonary effort is normal.  Skin:    General: Skin is warm and dry.  Neurological:     Mental Status: She is alert and oriented to person, place, and time.  Psychiatric:        Mood and Affect: Mood normal.        Behavior: Behavior normal.        Thought Content: Thought content normal.        Judgment: Judgment normal.     BP 135/86 (BP Location: Left Arm)   Pulse 90   Resp 16   Wt 208 lb (94.3 kg)   LMP  (LMP Unknown)   BMI 40.62 kg/m   Past Medical History:  Diagnosis Date   Allergic rhinitis    Chronic kidney disease    Depression    GERD (gastroesophageal reflux disease)    History of colon polyps    History of hiatal hernia    Hyperlipidemia    Hypertension    Hypothyroidism    Lymphedema of both lower extremities    Obesity    Osteoarthritis    Venous stasis     Social History   Socioeconomic History   Marital status: Divorced    Spouse name: Not on file   Number of children: Not on file   Years of education: Not on file   Highest education level: Not on file  Occupational History   Not on file  Tobacco Use    Smoking status: Never   Smokeless tobacco: Never  Vaping Use   Vaping status: Never Used  Substance and Sexual Activity   Alcohol use: Not Currently   Drug use: Never   Sexual activity: Not on file  Other Topics Concern   Not on file  Social History Narrative   Not on file   Social Determinants of Health   Financial Resource Strain: Low Risk  (06/16/2023)   Received from [facility]   Overall Financial Resource Strain (CARDIA)    Difficulty of Paying Living Expenses: Not very hard  Food Insecurity: No Food Insecurity (06/16/2023)   Received from [facility]   Hunger Vital Sign    Worried About Running Out of Food in the Last Year: Never true    Ran Out of Food in the Last Year: Never true  Transportation Needs: No Transportation Needs (06/16/2023)   Received from [facility] - [facility]    In the past 12 months, has lack of  transportation kept you from medical appointments or from getting medications?: No    Lack of Transportation (Non-Medical): No  Physical Activity: Not on file  Stress: Not on file  Social Connections: Not on file  Intimate Partner Violence: Not on file    Past Surgical History:  Procedure Laterality Date   ABDOMINAL HYSTERECTOMY     ANKLE ARTHROSCOPY     BACK SURGERY     CARPAL TUNNEL RELEASE Right    carpal tunnell release     done endoscopic   CATARACT EXTRACTION W/PHACO Right 09/28/2018   Procedure: CATARACT EXTRACTION PHACO AND INTRAOCULAR LENS PLACEMENT (IOC)  RIGHT;  Surgeon: Nevada Crane, MD;  Location: [facility];  Service: Ophthalmology;  Laterality: Right;   CATARACT EXTRACTION W/PHACO Left 10/19/2018   Procedure: CATARACT EXTRACTION PHACO AND INTRAOCULAR LENS PLACEMENT (IOC) LEFT;  Surgeon: Nevada Crane, MD;  Location: [facility];  Service: Ophthalmology;  Laterality: Left;   CHOLECYSTECTOMY     COLONOSCOPY WITH PROPOFOL N/A 06/02/2018    Procedure: COLONOSCOPY WITH PROPOFOL;  Surgeon: Toledo, Boykin Nearing, MD;  Location: [facility];  Service: Gastroenterology;  Laterality: N/A;   ESOPHAGOGASTRODUODENOSCOPY N/A 06/20/2021   Procedure: ESOPHAGOGASTRODUODENOSCOPY (EGD);  Surgeon: Toledo, Boykin Nearing, MD;  Location: [facility];  Service: Gastroenterology;  Laterality: N/A;   ESOPHAGOGASTRODUODENOSCOPY (EGD) WITH PROPOFOL N/A 06/02/2018   Procedure: ESOPHAGOGASTRODUODENOSCOPY (EGD) WITH PROPOFOL;  Surgeon: Toledo, Boykin Nearing, MD;  Location: [facility];  Service: Gastroenterology;  Laterality: N/A;   EYE SURGERY Bilateral 2016   Cataracts   JOINT REPLACEMENT     TONSILLECTOMY     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  Bandaids cause red skin    Morphine Nausea Only    Other reaction(s): Vomiting   Ciprofloxacin Rash and Other (See Comments)    Blisters    Oxycodone Nausea Only   Ketoprofen Nausea Only   Morphine And Codeine Other (See Comments)    "crazy" - altered mental status   Amoxicillin Rash   Amoxicillin-Pot Clavulanate Nausea Only and Rash   Bactrim [Sulfamethoxazole-Trimethoprim] Rash   Shrimp [Shellfish Allergy] Rash       Latest Ref Rng & Units 12/01/2021    5:42 AM 11/30/2021    7:13 AM 11/20/2021    9:17 AM  CBC  WBC 4.0 - 10.5 K/uL 9.0  7.8  4.9   Hemoglobin 12.0 - 15.0 g/dL 16.1  09.6  04.5   Hematocrit 36.0 - 46.0 % 32.1  35.8  38.4   Platelets 150 - 400 K/uL 177  219  203       CMP     Component Value Date/Time   NA 137 12/01/2021 0542   K 4.3 12/01/2021 0542   CL 108 12/01/2021 0542   CO2 23  12/01/2021 0542   GLUCOSE 110 (H) 12/01/2021 0542   BUN 17 12/01/2021 0542   CREATININE 0.89 12/01/2021 0542   CALCIUM 8.3 (L) 12/01/2021 0542   PROT 7.3 11/20/2021 0917   ALBUMIN 4.1 11/20/2021 0917   AST 18 11/20/2021 0917   ALT 10  11/20/2021 0917   ALKPHOS 72 11/20/2021 0917   BILITOT 0.8 11/20/2021 0917   GFRNONAA >60 12/01/2021 0542     No results found.     Assessment & Plan:   1. Lymphedema of both lower extremities Recommend:  No surgery or intervention at this point in time.    I have reviewed my discussion with the patient regarding lymphedema and why it  causes symptoms.  Patient will continue wearing graduated compression on a daily basis. The patient should put the compression on first thing in the morning and removing them in the evening. The patient should not sleep in the compression.   In addition, behavioral modification throughout the day will be continued.  This will include frequent elevation (such as in a recliner), use of over the counter pain medications as needed and exercise such as walking.  The systemic causes for chronic edema such as liver, kidney and cardiac etiologies does not appear to have significant changed over the past year.     The patient will follow-up with me on an annual basis.   2. Primary hypertension Continue antihypertensive medications as already ordered, these medications have been reviewed and there are no changes at this time.  3. Stage 3a chronic kidney disease (HCC) This may play a component of the patient's edema.   Current Outpatient Medications on File Prior to Visit  Medication Sig Dispense Refill   acetaminophen (TYLENOL) 650 MG CR tablet Take 1,300 mg by mouth every 8 (eight) hours as needed for pain.     Biotin 5000 MCG TABS Take 5,000 mcg by mouth daily.     CRANBERRY-VITAMIN C PO Take 1 tablet by mouth in the morning and at bedtime.     DULoxetine (CYMBALTA) 30 MG capsule Take 30 mg by mouth daily.      estradiol (ESTRACE) 1 MG tablet Take 1 mg by mouth daily.     gabapentin (NEURONTIN) 100 MG capsule Take 300 mg by mouth at bedtime.     levothyroxine (SYNTHROID) 100 MCG tablet Take 100 mcg by mouth daily.     pantoprazole (PROTONIX) 40 MG tablet Take 40 mg by mouth at bedtime.     rosuvastatin (CRESTOR) 20 MG tablet Take 20 mg by mouth daily.     TURMERIC CURCUMIN PO Take 1 capsule by mouth daily.     diclofenac sodium (VOLTAREN) 1 % GEL Apply 2 g topically 2 (two) times daily as needed (pain).     traMADol (ULTRAM) 50 MG tablet Take 1 tablet (50 mg total) by mouth every 8 (eight) hours as needed for moderate pain. 30 tablet 0   No current facility-administered medications on file prior to visit.    There are no Patient Instructions on file for this visit. No follow-ups on file.   Georgiana Spinner, NP

## 2023-11-18 ENCOUNTER — Observation Stay
Admission: EM | Admit: 2023-11-18 | Discharge: 2023-11-20 | Disposition: A | Discharge status: Home Health | Payer: Medicare Other | Attending: Internal Medicine | Admitting: Internal Medicine

## 2023-11-18 ENCOUNTER — Other Ambulatory Visit: Payer: Self-pay

## 2023-11-18 DIAGNOSIS — Z91040 Latex allergy status: Secondary | ICD-10-CM | POA: Diagnosis not present

## 2023-11-18 DIAGNOSIS — Z96651 Presence of right artificial knee joint: Secondary | ICD-10-CM | POA: Insufficient documentation

## 2023-11-18 DIAGNOSIS — M151 Heberden's nodes (with arthropathy): Secondary | ICD-10-CM | POA: Insufficient documentation

## 2023-11-18 DIAGNOSIS — N189 Chronic kidney disease, unspecified: Secondary | ICD-10-CM | POA: Diagnosis not present

## 2023-11-18 DIAGNOSIS — I129 Hypertensive chronic kidney disease with stage 1 through stage 4 chronic kidney disease, or unspecified chronic kidney disease: Secondary | ICD-10-CM | POA: Diagnosis not present

## 2023-11-18 DIAGNOSIS — Z96612 Presence of left artificial shoulder joint: Secondary | ICD-10-CM | POA: Insufficient documentation

## 2023-11-18 DIAGNOSIS — K219 Gastro-esophageal reflux disease without esophagitis: Secondary | ICD-10-CM | POA: Insufficient documentation

## 2023-11-18 DIAGNOSIS — E785 Hyperlipidemia, unspecified: Secondary | ICD-10-CM | POA: Insufficient documentation

## 2023-11-18 DIAGNOSIS — G629 Polyneuropathy, unspecified: Secondary | ICD-10-CM | POA: Diagnosis not present

## 2023-11-18 DIAGNOSIS — R55 Syncope and collapse: Principal | ICD-10-CM | POA: Insufficient documentation

## 2023-11-18 DIAGNOSIS — E039 Hypothyroidism, unspecified: Secondary | ICD-10-CM | POA: Diagnosis not present

## 2023-11-18 DIAGNOSIS — R110 Nausea: Secondary | ICD-10-CM | POA: Insufficient documentation

## 2023-11-18 DIAGNOSIS — Z79899 Other long term (current) drug therapy: Secondary | ICD-10-CM | POA: Diagnosis not present

## 2023-11-18 LAB — COMPREHENSIVE METABOLIC PANEL
ALT: 12 U/L (ref 0–44)
AST: 17 U/L (ref 15–41)
Albumin: 3.9 g/dL (ref 3.5–5.0)
Alkaline Phosphatase: 75 U/L (ref 38–126)
Anion gap: 12 (ref 5–15)
BUN: 17 mg/dL (ref 8–23)
CO2: 22 mmol/L (ref 22–32)
Calcium: 9.2 mg/dL (ref 8.9–10.3)
Chloride: 103 mmol/L (ref 98–111)
Creatinine, Ser: 1.12 mg/dL — ABNORMAL HIGH (ref 0.44–1.00)
GFR, Estimated: 51 mL/min — ABNORMAL LOW (ref >60)
Glucose, Bld: 119 mg/dL — ABNORMAL HIGH (ref 70–99)
Potassium: 3.8 mmol/L (ref 3.5–5.1)
Sodium: 137 mmol/L (ref 135–145)
Total Bilirubin: 0.8 mg/dL (ref <1.2)
Total Protein: 6.9 g/dL (ref 6.5–8.1)

## 2023-11-18 LAB — CBC WITH DIFFERENTIAL/PLATELET
Abs Immature Granulocytes: 0.06 10*3/uL (ref 0.00–0.07)
Basophils Absolute: 0.1 10*3/uL (ref 0.0–0.1)
Basophils Relative: 1 %
Eosinophils Absolute: 0.2 10*3/uL (ref 0.0–0.5)
Eosinophils Relative: 1 %
HCT: 42.3 % (ref 36.0–46.0)
Hemoglobin: 14.1 g/dL (ref 12.0–15.0)
Immature Granulocytes: 1 %
Lymphocytes Relative: 11 %
Lymphs Abs: 1.1 10*3/uL (ref 0.7–4.0)
MCH: 30.1 pg (ref 26.0–34.0)
MCHC: 33.3 g/dL (ref 30.0–36.0)
MCV: 90.2 fL (ref 80.0–100.0)
Monocytes Absolute: 0.6 10*3/uL (ref 0.1–1.0)
Monocytes Relative: 6 %
Neutro Abs: 8.5 10*3/uL — ABNORMAL HIGH (ref 1.7–7.7)
Neutrophils Relative %: 80 %
Platelets: 243 10*3/uL (ref 150–400)
RBC: 4.69 MIL/uL (ref 3.87–5.11)
RDW: 12.8 % (ref 11.5–15.5)
WBC: 10.4 10*3/uL (ref 4.0–10.5)
nRBC: 0 % (ref 0.0–0.2)

## 2023-11-18 LAB — TROPONIN I (HIGH SENSITIVITY): Troponin I (High Sensitivity): 7 ng/L (ref <18)

## 2023-11-18 MED ORDER — DULOXETINE HCL 30 MG PO CPEP
30.0000 mg | ORAL_CAPSULE | Freq: Every day | ORAL | Status: DC
  Administered 2023-11-19 – 2023-11-20 (×2): 30 mg via ORAL
  Filled 2023-11-18 (×2): qty 1

## 2023-11-18 MED ORDER — TRAMADOL HCL 50 MG PO TABS: 50.0000 mg | ORAL_TABLET | Freq: Three times a day (TID) | ORAL | Status: DC | PRN

## 2023-11-18 MED ORDER — ROSUVASTATIN CALCIUM 10 MG PO TABS
20.0000 mg | ORAL_TABLET | Freq: Every day | ORAL | Status: DC
  Administered 2023-11-19 – 2023-11-20 (×2): 20 mg via ORAL
  Filled 2023-11-18: qty 1
  Filled 2023-11-18: qty 2

## 2023-11-18 MED ORDER — ENOXAPARIN SODIUM 60 MG/0.6ML IJ SOSY
0.5000 mg/kg | PREFILLED_SYRINGE | INTRAMUSCULAR | Status: DC
  Administered 2023-11-19 (×2): 42.5 mg via SUBCUTANEOUS
  Filled 2023-11-18 (×2): qty 0.6

## 2023-11-18 MED ORDER — GABAPENTIN 300 MG PO CAPS
300.0000 mg | ORAL_CAPSULE | Freq: Every day | ORAL | Status: DC
Start: 2023-11-18 — End: 2023-11-20
  Administered 2023-11-19 (×2): 300 mg via ORAL
  Filled 2023-11-18 (×2): qty 1

## 2023-11-18 MED ORDER — PANTOPRAZOLE SODIUM 40 MG PO TBEC
40.0000 mg | DELAYED_RELEASE_TABLET | Freq: Every day | ORAL | Status: DC
Start: 2023-11-18 — End: 2023-11-20
  Administered 2023-11-19 (×2): 40 mg via ORAL
  Filled 2023-11-18 (×2): qty 1

## 2023-11-18 MED ORDER — SODIUM CHLORIDE 0.9% FLUSH
3.0000 mL | Freq: Two times a day (BID) | INTRAVENOUS | Status: DC
  Administered 2023-11-19 – 2023-11-20 (×3): 3 mL via INTRAVENOUS

## 2023-11-18 MED ORDER — LEVOTHYROXINE SODIUM 100 MCG PO TABS
100.0000 ug | ORAL_TABLET | Freq: Every day | ORAL | Status: DC
  Administered 2023-11-19 – 2023-11-20 (×2): 100 ug via ORAL
  Filled 2023-11-18: qty 2
  Filled 2023-11-18: qty 1

## 2023-11-18 MED ORDER — ESTRADIOL 0.5 MG PO TABS
1.0000 mg | ORAL_TABLET | Freq: Every day | ORAL | Status: DC
  Administered 2023-11-19: 1 mg via ORAL
  Filled 2023-11-18 (×2): qty 2

## 2023-11-18 MED ORDER — ONDANSETRON HCL 4 MG/2ML IJ SOLN
4.0000 mg | Freq: Once | INTRAMUSCULAR | Status: AC
Start: 2023-11-18 — End: 2023-11-18
  Administered 2023-11-18: 4 mg via INTRAVENOUS
  Filled 2023-11-18: qty 2

## 2023-11-18 MED ORDER — BIOTIN 5000 MCG PO TABS: 5000.0000 ug | ORAL_TABLET | Freq: Every day | ORAL | Status: DC

## 2023-11-18 MED ORDER — LACTATED RINGERS IV BOLUS
1000.0000 mL | Freq: Once | INTRAVENOUS | Status: AC
  Administered 2023-11-18: 1000 mL via INTRAVENOUS

## 2023-11-18 NOTE — ED Triage Notes (Signed)
Pt from home via EMS d/t nausea and diarrhea for the past few weeks.

## 2023-11-18 NOTE — ED Triage Notes (Signed)
Pt comes via EMS from home with increased weakness , N/V for 2 weeks.   VSS  Pt did have some diarrhea earlier. Pt did ambulate with some assistance with EMS.

## 2023-11-18 NOTE — ED Provider Notes (Signed)
[facility] Provider Note    Event Date/Time   First MD Initiated Contact with Patient 11/18/23 2027     (approximate)   History   Chief Complaint Nausea and Diarrhea   HPI  Janet Galvan is a 77 y.o. female with past medical history of hypertension, hyperlipidemia, and CKD who presents to the ED complaining of syncope.  Patient reports that she has been feeling nausea, dizziness, and lightheadedness for about the past week.  She initially had some cough and congestion with the symptoms, was seen by her PCP at that time and prescribed azithromycin.  Congestion and cough have improved, but she continues to feel dizzy and lightheaded.  Symptoms worsened today and she had an episode where she passed out after feeling lightheaded.  She denies any chest pain or shortness of breath, but continues to feel lightheaded here in the ED.  She denies any history of syncopal episodes in the past, denies significant cardiac history.      Physical Exam   Triage Vital Signs: ED Triage Vitals [11/18/23 1428]  Encounter Vitals Group     BP 131/60     Systolic BP Percentile      Diastolic BP Percentile      Pulse Rate 79     Resp 16     Temp 97.6 F (36.4 C)     Temp Source Oral     SpO2 100 %     Weight 191 lb (86.6 kg)     Height 4\' 10"  (1.473 m)     Head Circumference      Peak Flow      Pain Score 0     Pain Loc      Pain Education      Exclude from Growth Chart     Most recent vital signs: Vitals:   11/18/23 2200 11/18/23 2209  BP: (!) 151/50   Pulse: 75   Resp: 18   Temp:  98 F (36.7 C)  SpO2: 99%     Constitutional: Alert and oriented. Eyes: Conjunctivae are normal. Head: Atraumatic. Nose: No congestion/rhinnorhea. Mouth/Throat: Mucous membranes are moist.  Cardiovascular: Normal rate, regular rhythm. Grossly normal heart sounds.  2+ radial pulses bilaterally. Respiratory: Normal respiratory effort.  No retractions. Lungs  CTAB. Gastrointestinal: Soft and nontender. No distention. Musculoskeletal: No lower extremity tenderness nor edema.  Neurologic:  Normal speech and language. No gross focal neurologic deficits are appreciated.    ED Results / Procedures / Treatments   Labs (all labs ordered are listed, but only abnormal results are displayed) Labs Reviewed  COMPREHENSIVE METABOLIC PANEL - Abnormal; Notable for the following components:      Result Value   Glucose, Bld 119 (*)    Creatinine, Ser 1.12 (*)    GFR, Estimated 51 (*)    All other components within normal limits  CBC WITH DIFFERENTIAL/PLATELET - Abnormal; Notable for the following components:   Neutro Abs 8.5 (*)    All other components within normal limits  URINALYSIS, ROUTINE W REFLEX MICROSCOPIC  TROPONIN I (HIGH SENSITIVITY)     EKG  ED ECG REPORT I, Chesley Noon, the attending physician, personally viewed and interpreted this ECG.   Date: 11/18/2023  EKG Time: 14:36  Rate: 58  Rhythm: sinus bradycardia  Axis: Normal  Intervals:none  ST&T Change: None  PROCEDURES:  Critical Care performed: No  Procedures   MEDICATIONS ORDERED IN ED: Medications  lactated ringers bolus 1,000 mL (1,000 mLs Intravenous  Bolus from Bag 11/18/23 2132)  ondansetron [facility]) injection 4 mg (4 mg Intravenous Given 11/18/23 2132)     IMPRESSION / MDM / ASSESSMENT AND PLAN / ED COURSE  I reviewed the triage vital signs and the nursing notes.                              77 y.o. female with past medical history of hypertension, hyperlipidemia, and CKD who presents to the ED complaining of 1 week of nausea with lightheadedness followed by a syncopal episode earlier today.  Patient's presentation is most consistent with acute presentation with potential threat to life or bodily function.  Differential diagnosis includes, but is not limited to, arrhythmia, orthostatic hypotension, vasovagal episode, anemia, electrolyte abnormality, AKI,  UTI.  Patient nontoxic-appearing and in no acute distress, vital signs are unremarkable.  EKG shows no evidence of arrhythmia or ischemia and troponin within normal limits, overall low suspicion for cardiac etiology.  Remainder of labs are reassuring with no significant anemia, leukocytosis, or electrolyte abnormality.  She does have a mild AKI, LFTs are unremarkable.  Orthostatic vital signs are unremarkable, but patient does have significant dizziness upon attempting to stand.  Minimal improvement noted following IV Zofran and fluid bolus, case discussed with hospitalist for admission for further syncope workup.      FINAL CLINICAL IMPRESSION(S) / ED DIAGNOSES   Final diagnoses:  Syncope, unspecified syncope type  Nausea     Rx / DC Orders   ED Discharge Orders     None        Note:  This document was prepared using Dragon voice recognition software and may include unintentional dictation errors.   Chesley Noon, MD 11/18/23 2257

## 2023-11-18 NOTE — ED Notes (Signed)
Orthostatic vitals   Lying 145/51 64, 100%  Sitting 154/55, 62, 100%  Standing 141/74, 97, 100% Dizziness

## 2023-11-18 NOTE — H&P (Incomplete)
[place]   PATIENT NAME: Janet Galvan    MR#:  [id number]  DATE OF BIRTH:  1946-11-18  DATE OF ADMISSION:  11/18/2023  PRIMARY CARE PHYSICIAN: Marguarite Arbour, MD   Patient is coming from: Home  REQUESTING/REFERRING PHYSICIAN: Chesley Noon, MD  CHIEF COMPLAINT:   Chief Complaint  Patient presents with   Nausea   Diarrhea    HISTORY OF PRESENT ILLNESS:  Janet Galvan is a 77 y.o. female with medical history significant for GERD, depression, hypertension, dyslipidemia and hypothyroidism, who presented to the emergency room with acute onset of syncope.  The patient experienced dizziness and lightheadedness and collapsed in her daughter's arms.  No falls or injuries.  No chest pain or palpitations prior to or after her syncope.  She denied any paresthesias or focal muscle weakness.  No fever or chills.  She has been having cough for a week and was given a Z-Pak which helped with her symptoms.  No dysuria, oliguria or hematuria or flank pain.  No bleeding diathesis.  ED Course: When she came to the ER, vital signs were within normal and later BP was 132/56.  Labs revealed normal CMP and CBC.  High-sensitivity troponin I was 7. EKG as reviewed by me : EKG showed sinus tachycardia with rate of 58 with nonspecific ST abnormalities. Imaging: None.  The patient was given 1 L bolus of IV lactated Ringer and 4 mg of IV Zofran.  She will be admitted to an observation medical telemetry bed for further evaluation and management. PAST MEDICAL HISTORY:   Past Medical History:  Diagnosis Date   Allergic rhinitis    Chronic kidney disease    Depression    GERD (gastroesophageal reflux disease)    History of colon polyps    History of hiatal hernia    Hyperlipidemia    Hypertension    Hypothyroidism    Lymphedema of both lower extremities    Obesity    Osteoarthritis    Venous stasis     PAST SURGICAL HISTORY:   Past Surgical History:  Procedure Laterality  Date   ABDOMINAL HYSTERECTOMY     ANKLE ARTHROSCOPY     BACK SURGERY     CARPAL TUNNEL RELEASE Right    carpal tunnell release     done endoscopic   CATARACT EXTRACTION W/PHACO Right 09/28/2018   Procedure: CATARACT EXTRACTION PHACO AND INTRAOCULAR LENS PLACEMENT (IOC)  RIGHT;  Surgeon: Nevada Crane, MD;  Location: [facility];  Service: Ophthalmology;  Laterality: Right;   CATARACT EXTRACTION W/PHACO Left 10/19/2018   Procedure: CATARACT EXTRACTION PHACO AND INTRAOCULAR LENS PLACEMENT (IOC) LEFT;  Surgeon: Nevada Crane, MD;  Location: [facility];  Service: Ophthalmology;  Laterality: Left;   CHOLECYSTECTOMY     COLONOSCOPY WITH PROPOFOL N/A 06/02/2018   Procedure: COLONOSCOPY WITH PROPOFOL;  Surgeon: Toledo, Boykin Nearing, MD;  Location: [facility];  Service: Gastroenterology;  Laterality: N/A;   ESOPHAGOGASTRODUODENOSCOPY N/A 06/20/2021   Procedure: ESOPHAGOGASTRODUODENOSCOPY (EGD);  Surgeon: Toledo, Boykin Nearing, MD;  Location: [facility];  Service: Gastroenterology;  Laterality: N/A;   ESOPHAGOGASTRODUODENOSCOPY (EGD) WITH PROPOFOL N/A 06/02/2018   Procedure: ESOPHAGOGASTRODUODENOSCOPY (EGD) WITH PROPOFOL;  Surgeon: Toledo, Boykin Nearing, MD;  Location: [facility];  Service: Gastroenterology;  Laterality: N/A;   EYE SURGERY Bilateral 2016   Cataracts   JOINT REPLACEMENT     TONSILLECTOMY     TOTAL KNEE ARTHROPLASTY Right 11/29/2021   Procedure: TOTAL KNEE ARTHROPLASTY;  Surgeon: Joice Lofts,  Excell Seltzer, MD;  Location: [facility];  Service: Orthopedics;  Laterality: Right;   TOTAL SHOULDER REPLACEMENT Left    TUBAL LIGATION      SOCIAL HISTORY:   Social History   Tobacco Use   Smoking status: Never   Smokeless tobacco: Never  Substance Use Topics   Alcohol use: Not Currently    FAMILY HISTORY:   Family History  Problem Relation Age of Onset   Breast cancer Maternal Aunt 48    DRUG ALLERGIES:   Allergies  Allergen Reactions   Latex Rash and Other  (See Comments)    Compression stockings cause blisters.  Bandaids cause red skin    Morphine Nausea Only    Other reaction(s): Vomiting   Ciprofloxacin Rash and Other (See Comments)    Blisters    Oxycodone Nausea Only   Ketoprofen Nausea Only   Morphine And Codeine Other (See Comments)    "crazy" - altered mental status   Amoxicillin Rash   Amoxicillin-Pot Clavulanate Nausea Only and Rash   Bactrim [Sulfamethoxazole-Trimethoprim] Rash   Shrimp [Shellfish Allergy] Rash    REVIEW OF SYSTEMS:   ROS As per history of present illness. All pertinent systems were reviewed above. Constitutional, HEENT, cardiovascular, respiratory, GI, GU, musculoskeletal, neuro, psychiatric, endocrine, integumentary and hematologic systems were reviewed and are otherwise negative/unremarkable except for positive findings mentioned above in the HPI.   MEDICATIONS AT HOME:   Prior to Admission medications   Medication Sig Start Date End Date Taking? Authorizing Provider  acetaminophen (TYLENOL) 650 MG CR tablet Take 1,300 mg by mouth every 8 (eight) hours as needed for pain.    [provider]  Biotin 5000 MCG TABS Take 5,000 mcg by mouth daily.    [provider]  CRANBERRY-VITAMIN C PO Take 1 tablet by mouth in the morning and at bedtime.    [provider]  diclofenac sodium (VOLTAREN) 1 % GEL Apply 2 g topically 2 (two) times daily as needed (pain).    [provider]  DULoxetine (CYMBALTA) 30 MG capsule Take 30 mg by mouth daily.    [provider]  estradiol (ESTRACE) 1 MG tablet Take 1 mg by mouth daily.    [provider]  gabapentin (NEURONTIN) 100 MG capsule Take 300 mg by mouth at bedtime.    [provider]  levothyroxine (SYNTHROID) 100 MCG tablet Take 100 mcg by mouth daily. 03/15/21   [provider]  pantoprazole (PROTONIX) 40 MG tablet Take 40 mg by mouth at bedtime.    [provider]  rosuvastatin (CRESTOR)  20 MG tablet Take 20 mg by mouth daily.    [provider]  traMADol (ULTRAM) 50 MG tablet Take 1 tablet (50 mg total) by mouth every 8 (eight) hours as needed for moderate pain. 11/30/21   Anson Oregon, PA-C  TURMERIC CURCUMIN PO Take 1 capsule by mouth daily.    [provider]      VITAL SIGNS:  Blood pressure (!) 105/45, pulse 65, temperature 98.1 F (36.7 C), temperature source Oral, resp. rate 18, height 4\' 10"  (1.473 m), weight 86.6 kg, SpO2 98%.  PHYSICAL EXAMINATION:  Physical Exam  GENERAL:  77 y.o.-year-old Caucasian female patient lying in the bed with no acute distress.  EYES: Pupils equal, round, reactive to light and accommodation. No scleral icterus. Extraocular muscles intact.  HEENT: Head atraumatic, normocephalic. Oropharynx and nasopharynx clear.  NECK:  Supple, no jugular venous distention. No thyroid enlargement, no tenderness.  LUNGS: Normal breath sounds bilaterally, no wheezing, rales,rhonchi or crepitation. No use of accessory muscles of respiration.  CARDIOVASCULAR: Regular rate and rhythm, S1, S2 normal. No murmurs, rubs, or gallops.  ABDOMEN: Soft, nondistended, nontender. Bowel sounds present. No organomegaly or mass.  EXTREMITIES: No pedal edema, cyanosis, or clubbing.  NEUROLOGIC: Cranial nerves II through XII are intact. Muscle strength 5/5 in all extremities. Sensation intact. Gait not checked.  PSYCHIATRIC: The patient is alert and oriented x 3.  Normal affect and good eye contact. SKIN: No obvious rash, lesion, or ulcer.   LABORATORY PANEL:   CBC Recent Labs  Lab 11/18/23 1433  WBC 10.4  HGB 14.1  HCT 42.3  PLT 243   ------------------------------------------------------------------------------------------------------------------  Chemistries  Recent Labs  Lab 11/18/23 1433  NA 137  K 3.8  CL 103  CO2 22  GLUCOSE 119*  BUN 17  CREATININE 1.12*  CALCIUM 9.2  AST 17  ALT 12  ALKPHOS 75  BILITOT 0.8    ------------------------------------------------------------------------------------------------------------------  Cardiac Enzymes No results for input(s): "TROPONINI" in the last 168 hours. ------------------------------------------------------------------------------------------------------------------  RADIOLOGY:  No results found.    IMPRESSION AND PLAN:  Assessment and Plan: * Syncope - The patient will be admitted to an observation medically monitored bed. - Will check orthostatics q 12 hours. - Will obtain a bilateral carotid Doppler and 2D echo. - The patient will be gently hydrated with IV normal saline and monitored for arrhythmias. -Differential diagnoses would include neurally mediated syncope, cardiogenic, arrhythmias related,  orthostatic hypotension and less likely hypoglycemia.    Hypothyroidism - We will continue Synthroid.  Dyslipidemia - We will continue statin therapy.  GERD without esophagitis - We will continue PPI therapy.  Peripheral neuropathy - We will continue Neurontin.       DVT prophylaxis: Lovenox.  Advanced Care Planning:  Code Status: This was discussed with the patient.  She is DNR and DNI. Family Communication:  The plan of care was discussed in details with the patient (and family). I answered all questions. The patient agreed to proceed with the above mentioned plan. Further management will depend upon hospital course. Disposition Plan: Back to previous home environment Consults called: none.  All the records are reviewed and case discussed with ED provider.  Status is: Observation  I certify that at the time of admission, it is my clinical judgment that the patient will require hospital care extending less than 2 midnights.                            Dispo: The patient is from: Home              Anticipated d/c is to: Home              Patient currently is not medically stable to d/c.              Difficult to place  patient: No  Hannah Beat M.D on 11/19/2023 at 2:31 AM  [facility]   From 7 PM-7 AM, contact night-coverage [email or web address]  CC: Primary care physician; Marguarite Arbour, MD

## 2023-11-18 NOTE — ED Provider Triage Note (Signed)
Emergency Medicine Provider Triage Evaluation Note  Janet Galvan , a 77 y.o. female  was evaluated in triage.  Pt complains of continued weakness , n/v/and diarrhea x 2 weeks.  Weakness is worse especially when standing up. Was seen at [facility] last week and finished a zpack yesterday.  Not any better.    Review of Systems  Positive: Sweaty, clammy, weak,  n/v/d Negative: No CP  Physical Exam  BP 131/60 (BP Location: Left Arm)   Pulse 79   Temp 97.6 F (36.4 C) (Oral)   Resp 16   Ht 4\' 10"  (1.473 m)   Wt 86.6 kg   LMP  (LMP Unknown)   SpO2 100%   BMI 39.92 kg/m  Gen:   Awake, no distress, alert, talkative and answer questions without difficulty.  Resp:  Normal effort   Lungs clear bilat. MSK:   Moves extremities without difficulty.   Other:    Medical Decision Making  Medically screening exam initiated at 2:33 PM.  Appropriate orders placed.  Gracy Racer was informed that the remainder of the evaluation will be completed by another provider, this initial triage assessment does not replace that evaluation, and the importance of remaining in the ED until their evaluation is complete.     Tommi Rumps, PA-C 11/18/23 1438

## 2023-11-19 ENCOUNTER — Observation Stay: Payer: Medicare Other

## 2023-11-19 ENCOUNTER — Observation Stay
Admit: 2023-11-19 | Discharge: 2023-11-19 | Disposition: A | Discharge status: Home / Self Care | Payer: Medicare Other | Attending: Family Medicine | Admitting: Family Medicine

## 2023-11-19 DIAGNOSIS — E785 Hyperlipidemia, unspecified: Secondary | ICD-10-CM

## 2023-11-19 DIAGNOSIS — K219 Gastro-esophageal reflux disease without esophagitis: Secondary | ICD-10-CM | POA: Insufficient documentation

## 2023-11-19 DIAGNOSIS — R55 Syncope and collapse: Secondary | ICD-10-CM | POA: Diagnosis not present

## 2023-11-19 DIAGNOSIS — G629 Polyneuropathy, unspecified: Secondary | ICD-10-CM

## 2023-11-19 LAB — ECHOCARDIOGRAM COMPLETE
AR max vel: 2.16 cm2
AV Area VTI: 2.23 cm2
AV Area mean vel: 2.04 cm2
AV Mean grad: 3 mm[Hg]
AV Peak grad: 6.4 mm[Hg]
Ao pk vel: 1.26 m/s
Area-P 1/2: 3.34 cm2
Calc EF: 53.7 %
Height: 58 in
MV VTI: 2.24 cm2
S' Lateral: 3.3 cm
Single Plane A2C EF: 55.5 %
Single Plane A4C EF: 50.9 %
Weight: 3056 [oz_av]

## 2023-11-19 LAB — URINALYSIS, ROUTINE W REFLEX MICROSCOPIC
Bilirubin Urine: NEGATIVE
Glucose, UA: NEGATIVE mg/dL
Hgb urine dipstick: NEGATIVE
Ketones, ur: NEGATIVE mg/dL
Nitrite: NEGATIVE
Protein, ur: NEGATIVE mg/dL
Specific Gravity, Urine: >1.046 — ABNORMAL HIGH (ref 1.005–1.030)
pH: 5 (ref 5.0–8.0)

## 2023-11-19 LAB — BASIC METABOLIC PANEL
Anion gap: 8 (ref 5–15)
BUN: 16 mg/dL (ref 8–23)
CO2: 23 mmol/L (ref 22–32)
Calcium: 8.6 mg/dL — ABNORMAL LOW (ref 8.9–10.3)
Chloride: 107 mmol/L (ref 98–111)
Creatinine, Ser: 1 mg/dL (ref 0.44–1.00)
GFR, Estimated: 58 mL/min — ABNORMAL LOW (ref >60)
Glucose, Bld: 93 mg/dL (ref 70–99)
Potassium: 3.6 mmol/L (ref 3.5–5.1)
Sodium: 138 mmol/L (ref 135–145)

## 2023-11-19 LAB — CBC
HCT: 38.2 % (ref 36.0–46.0)
Hemoglobin: 12.8 g/dL (ref 12.0–15.0)
MCH: 30.8 pg (ref 26.0–34.0)
MCHC: 33.5 g/dL (ref 30.0–36.0)
MCV: 91.8 fL (ref 80.0–100.0)
Platelets: 213 10*3/uL (ref 150–400)
RBC: 4.16 MIL/uL (ref 3.87–5.11)
RDW: 12.7 % (ref 11.5–15.5)
WBC: 7 10*3/uL (ref 4.0–10.5)
nRBC: 0 % (ref 0.0–0.2)

## 2023-11-19 MED ORDER — RALOXIFENE HCL 60 MG PO TABS
60.0000 mg | ORAL_TABLET | Freq: Every day | ORAL | Status: DC
  Administered 2023-11-19 – 2023-11-20 (×2): 60 mg via ORAL
  Filled 2023-11-19 (×2): qty 1

## 2023-11-19 MED ORDER — MIDODRINE HCL 5 MG PO TABS
5.0000 mg | ORAL_TABLET | Freq: Two times a day (BID) | ORAL | Status: DC
  Administered 2023-11-19 – 2023-11-20 (×2): 5 mg via ORAL
  Filled 2023-11-19 (×2): qty 1

## 2023-11-19 MED ORDER — ASPIRIN 81 MG PO TBEC
81.0000 mg | DELAYED_RELEASE_TABLET | Freq: Every day | ORAL | Status: DC
  Administered 2023-11-19 – 2023-11-20 (×2): 81 mg via ORAL
  Filled 2023-11-19 (×2): qty 1

## 2023-11-19 MED ORDER — DICLOFENAC SODIUM 1 % EX GEL
4.0000 g | Freq: Four times a day (QID) | CUTANEOUS | Status: DC
  Administered 2023-11-19 – 2023-11-20 (×2): 4 g via TOPICAL
  Filled 2023-11-19 (×2): qty 100

## 2023-11-19 MED ORDER — IOHEXOL 350 MG/ML SOLN
75.0000 mL | Freq: Once | INTRAVENOUS | Status: AC | PRN
  Administered 2023-11-19: 75 mL via INTRAVENOUS

## 2023-11-19 NOTE — ED Notes (Signed)
Pt denies any needs.

## 2023-11-19 NOTE — Progress Notes (Signed)
Anticoagulation monitoring(Lovenox):  77 yo female ordered Lovenox 40 mg Q24h    Filed Weights   11/18/23 1428  Weight: 86.6 kg (191 lb)   BMI 39.9    Lab Results  Component Value Date   CREATININE 1.12 (H) 11/18/2023   CREATININE 0.89 12/01/2021   CREATININE 0.83 11/30/2021   Estimated Creatinine Clearance: 39.9 mL/min (A) (by C-G formula based on SCr of 1.12 mg/dL (H)). Hemoglobin & Hematocrit     Component Value Date/Time   HGB 14.1 11/18/2023 1433   HCT 42.3 11/18/2023 1433     Per Protocol for Patient with estCrcl > 30 ml/min and BMI > 30, will transition to Lovenox 42.5 mg Q24h.

## 2023-11-19 NOTE — Evaluation (Signed)
Physical Therapy Evaluation Patient Details Name: Janet Galvan MRN: [id number] DOB: 1946/08/10 Today's Date: 11/19/2023  History of Present Illness  Pt is a 77 year old female presenting to the ED with acute onset of syncope    PMH significant for GERD, depression, hypertension, dyslipidemia and hypothyroidism,  Clinical Impression  Pt very pleasant and willing to work with PT, ultimately she moved well and was able to ambulate >100 ft with safe and community appropriate gait.  She does not typically need/use an AD (occasional SPC) but did endorse feeling much safer with it today.  Pt's BP remained soft but stable with orthostatic testing and she did not have any adverse symptoms during prolonged bout of ambulation.  Pt is moving well but is not at her baseline, will benefit from continued PT to address functional limitations.        If plan is discharge home, recommend the following: Assistance with cooking/housework;Assist for transportation;A little help with bathing/dressing/bathroom   Can travel by private vehicle        Equipment Recommendations None recommended by PT  Recommendations for Other Services       Functional Status Assessment Patient has had a recent decline in their functional status and demonstrates the ability to make significant improvements in function in a reasonable and predictable amount of time.     Precautions / Restrictions Precautions Precautions: Fall Precaution Comments: watch bp Restrictions Weight Bearing Restrictions Per Provider Order: No      Mobility  Bed Mobility Overal bed mobility: Modified Independent, Needs Assistance Bed Mobility: Supine to Sit, Sit to Supine     Supine to sit: Supervision Sit to supine: Min assist (high ED stretcher, assist with LEs)        Transfers Overall transfer level: Needs assistance Equipment used: None Transfers: Sit to/from Stand Sit to Stand: Min assist           General transfer  comment: Pt was able to rise from (elevated) surface w/o hesitation, good confidence and appropriate use of UEs/AD    Ambulation/Gait Ambulation/Gait assistance: Supervision Gait Distance (Feet): 150 Feet Assistive device: Rolling walker (2 wheels)         General Gait Details: Pt with slow but consistent and safe cadence.  Not overly reliant on the walker but does endorse feeling much safer and more confident with it.  Stairs            Wheelchair Mobility     Tilt Bed    Modified Rankin (Stroke Patients Only)       Balance Overall balance assessment: Needs assistance Sitting-balance support: Feet supported Sitting balance-Leahy Scale: Good     Standing balance support: Bilateral upper extremity supported Standing balance-Leahy Scale: Good                               Pertinent Vitals/Pain Pain Assessment Pain Assessment: Faces Faces Pain Scale: Hurts a little bit Pain Location: dorsal surface of R hand    Home Living Family/patient expects to be discharged to:: Private residence Living Arrangements: Alone Available Help at Discharge: Family;Available PRN/intermittently;Available 24 hours/day Type of Home: House Home Access: Level entry;Other (comment) (small threshold)       Home Layout: One level Home Equipment: Grab bars - toilet;Grab bars - tub/shower;BSC/3in1;Shower seat;Rollator (4 wheels);Rolling Walker (2 wheels)      Prior Function Prior Level of Function : Independent/Modified Independent  Mobility Comments: amb with cane or no AD, reports driving and out of the home nearly QD ADLs Comments: MOD I in ADL/IADL, family assists prn, cooks/cleans/drives has someone clean her house 1x per month     Extremity/Trunk Assessment   Upper Extremity Assessment Upper Extremity Assessment: Generalized weakness (mild R sided weakness, pain hesitant)    Lower Extremity Assessment Lower Extremity Assessment: Generalized  weakness;Overall [facility] for tasks assessed    Cervical / Trunk Assessment Cervical / Trunk Assessment: Normal  Communication   Communication Communication: No apparent difficulties Cueing Techniques: Verbal cues  Cognition Arousal: Alert Behavior During Therapy: WFL for tasks assessed/performed Overall Cognitive Status: Within Functional Limits for tasks assessed                                          General Comments General comments (skin integrity, edema, etc.): BP supine: 97/56, sitting: 92/76, standing: 98/81 - no syncopal issues t/o ambulation    Exercises     Assessment/Plan    PT Assessment Patient needs continued PT services  PT Problem List Decreased strength;Decreased activity tolerance;Decreased balance;Decreased cognition;Decreased knowledge of use of DME;Decreased safety awareness;Cardiopulmonary status limiting activity       PT Treatment Interventions DME instruction;Gait training;Functional mobility training;Therapeutic activities;Therapeutic exercise;Balance training;Cognitive remediation;Patient/family education    PT Goals (Current goals can be found in the Care Plan section)  Acute Rehab PT Goals Patient Stated Goal: make sure I'm safe to go home PT Goal Formulation: With patient Time For Goal Achievement: 12/02/23 Potential to Achieve Goals: Good    Frequency Min 1X/week     Co-evaluation               AM-PAC PT "6 Clicks" Mobility  Outcome Measure Help needed turning from your back to your side while in a flat bed without using bedrails?: None Help needed moving from lying on your back to sitting on the side of a flat bed without using bedrails?: None Help needed moving to and from a bed to a chair (including a wheelchair)?: A Little Help needed standing up from a chair using your arms (e.g., wheelchair or bedside chair)?: A Little Help needed to walk in hospital room?: A Little Help needed climbing 3-5 steps with a  railing? : A Little 6 Click Score: 20    End of Session Equipment Utilized During Treatment: Gait belt Activity Tolerance: Patient tolerated treatment well Patient left: in bed (imaging in room) Nurse Communication: Mobility status PT Visit Diagnosis: Muscle weakness (generalized) (M62.81);Unsteadiness on feet (R26.81)    Time: 1610-9604 PT Time Calculation (min) (ACUTE ONLY): 23 min   Charges:   PT Evaluation $PT Eval Low Complexity: 1 Low PT Treatments $Gait Training: 8-22 mins PT General Charges $$ ACUTE PT VISIT: 1 Visit         Malachi Pro, DPT 11/19/2023, 4:49 PM

## 2023-11-19 NOTE — Progress Notes (Signed)
PROGRESS NOTE    Janet Galvan  KGM:[id number] DOB: 11-05-1946 DOA: 11/18/2023 PCP: Marguarite Arbour, MD    Brief Narrative:   77 y.o. female with medical history significant for GERD, depression, hypertension, dyslipidemia and hypothyroidism, who presented to the emergency room with acute onset of syncope.  The patient experienced dizziness and lightheadedness and collapsed in her daughter's arms.  No falls or injuries.  No chest pain or palpitations prior to or after her syncope.  She denied any paresthesias or focal muscle weakness.  No fever or chills.  She has been having cough for a week and was given a Z-Pak which helped with her symptoms.  No dysuria, oliguria or hematuria or flank pain.  No bleeding diathesis.    Assessment & Plan:   Principal Problem:   Syncope Active Problems:   Hypothyroidism   Dyslipidemia   Peripheral neuropathy   GERD without esophagitis  Syncope Orthostatic hypotension Differential remains somewhat broad however orthostatic hypotension appears to be primary driver.  CT angio reviewed.  Negative for pulmonary embolism.  Carotid Doppler reassuring.  2D echocardiogram completed.  Read pending. Plan: Telemetry monitoring Twice daily orthostatics Follow-up 2D echocardiogram Start midodrine 5 mg twice daily Repeat therapy evaluations in a.m.  Hypothyroidism PTA Synthroid  Hyperlipidemia PTA statin  GERD PPI  Peripheral neuropathy PTA gabapentin  Obesity BMI 39.9.  Complicating factor in overall care and prognosis    DVT prophylaxis: Lovenox Code Status: DNR Family Communication: Daughter Marchelle Folks [phone] on 12/18 Disposition Plan: Status is: Observation The patient will require care spanning > 2 midnights and should be moved to inpatient because: Symptomatic orthostatic hypotension.  This precludes a safe discharge.   Level of care: Telemetry Medical  Consultants:  None  Procedures:   None  Antimicrobials: None   Subjective: Seen and examined.  Resting comfortably in bed.  Patient does endorse fatigue.  Objective: Vitals:   11/19/23 0227 11/19/23 0300 11/19/23 0500 11/19/23 0800  BP:  (!) 105/40 (!) 114/53 (!) 92/33  Pulse:  63 70 61  Resp:  18    Temp: 98.1 F (36.7 C) 98 F (36.7 C)    TempSrc: Oral Oral    SpO2:  96% 98% 96%  Weight:      Height:        Intake/Output Summary (Last 24 hours) at 11/19/2023 1548 Last data filed at 11/19/2023 0138 Gross per 24 hour  Intake 1000 ml  Output --  Net 1000 ml   Filed Weights   11/18/23 1428  Weight: 86.6 kg    Examination:  General exam: NAD.  Appears fatigued Respiratory system: Clear to auscultation. Respiratory effort normal. Cardiovascular system: S1-S2, RRR, no murmurs, no pedal edema Gastrointestinal system: Obese, soft, NT/ND, normal bowel sounds Central nervous system: Alert and oriented. No focal neurological deficits. Extremities: Decreased grip strength right hand Skin: No rashes, lesions or ulcers Psychiatry: Judgement and insight appear normal. Mood & affect appropriate.     Data Reviewed: I have personally reviewed following labs and imaging studies  CBC: Recent Labs  Lab 11/18/23 1433 11/19/23 0602  WBC 10.4 7.0  NEUTROABS 8.5*  --   HGB 14.1 12.8  HCT 42.3 38.2  MCV 90.2 91.8  PLT 243 213   Basic Metabolic Panel: Recent Labs  Lab 11/18/23 1433 11/19/23 0602  NA 137 138  K 3.8 3.6  CL 103 107  CO2 22 23  GLUCOSE 119* 93  BUN 17 16  CREATININE 1.12* 1.00  CALCIUM 9.2 8.6*  GFR: Estimated Creatinine Clearance: 44.7 mL/min (by C-G formula based on SCr of 1 mg/dL). Liver Function Tests: Recent Labs  Lab 11/18/23 1433  AST 17  ALT 12  ALKPHOS 75  BILITOT 0.8  PROT 6.9  ALBUMIN 3.9   No results for input(s): "LIPASE", "AMYLASE" in the last 168 hours. No results for input(s): "AMMONIA" in the last 168 hours. Coagulation Profile: No results for  input(s): "INR", "PROTIME" in the last 168 hours. Cardiac Enzymes: No results for input(s): "CKTOTAL", "CKMB", "CKMBINDEX", "TROPONINI" in the last 168 hours. BNP (last 3 results) No results for input(s): "PROBNP" in the last 8760 hours. HbA1C: No results for input(s): "HGBA1C" in the last 72 hours. CBG: No results for input(s): "GLUCAP" in the last 168 hours. Lipid Profile: No results for input(s): "CHOL", "HDL", "LDLCALC", "TRIG", "CHOLHDL", "LDLDIRECT" in the last 72 hours. Thyroid Function Tests: No results for input(s): "TSH", "T4TOTAL", "FREET4", "T3FREE", "THYROIDAB" in the last 72 hours. Anemia Panel: No results for input(s): "VITAMINB12", "FOLATE", "FERRITIN", "TIBC", "IRON", "RETICCTPCT" in the last 72 hours. Sepsis Labs: No results for input(s): "PROCALCITON", "LATICACIDVEN" in the last 168 hours.  No results found for this or any previous visit (from the past 240 hours).       Radiology Studies: CT Angio Chest Pulmonary Embolism (PE) W or WO Contrast Result Date: 11/19/2023 CLINICAL DATA:  Weakness, nausea and vomiting, diarrhea for 2 weeks EXAM: CT ANGIOGRAPHY CHEST WITH CONTRAST TECHNIQUE: Multidetector CT imaging of the chest was performed using the standard protocol during bolus administration of intravenous contrast. Multiplanar CT image reconstructions and MIPs were obtained to evaluate the vascular anatomy. RADIATION DOSE REDUCTION: This exam was performed according to the departmental dose-optimization program which includes automated exposure control, adjustment of the mA and/or kV according to patient size and/or use of iterative reconstruction technique. CONTRAST:  75mL OMNIPAQUE IOHEXOL 350 MG/ML SOLN COMPARISON:  None Available. FINDINGS: Cardiovascular: This is a technically adequate evaluation of the pulmonary vasculature. No filling defects or pulmonary emboli. Mild left atrial dilatation. Otherwise the heart is unremarkable without pericardial effusion. No  evidence of thoracic aortic aneurysm or dissection. Atherosclerosis of the aorta and coronary vasculature. Mediastinum/Nodes: No enlarged mediastinal, hilar, or axillary lymph nodes. Thyroid gland, trachea, and esophagus demonstrate no significant findings. Small hiatal hernia. Lungs/Pleura: No acute airspace disease, effusion, or pneumothorax. Central airways are patent. Upper Abdomen: No acute abnormality. 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 No acute or destructive bony abnormalities. Reconstructed images demonstrate no additional findings. Review of the MIP images confirms the above findings. IMPRESSION: 1. No evidence of pulmonary embolus. 2. No acute intrathoracic process. 3. Small hiatal hernia. 4. Aortic Atherosclerosis (ICD10-I70.0). Coronary artery atherosclerosis. Electronically Signed   By: Sharlet Salina M.D.   On: 11/19/2023 11:15   US Carotid Bilateral Result Date: 11/19/2023 CLINICAL DATA:  Syncope Hypertension Hyperlipidemia EXAM: BILATERAL CAROTID DUPLEX ULTRASOUND TECHNIQUE: Wallace Cullens scale imaging, color Doppler and duplex ultrasound were performed of bilateral carotid and vertebral arteries in the neck. COMPARISON:  None available FINDINGS: Criteria: Quantification of carotid stenosis is based on velocity parameters that correlate the residual internal carotid diameter with NASCET-based stenosis levels, using the diameter of the distal internal carotid lumen as the denominator for stenosis measurement. The following velocity measurements were obtained: RIGHT ICA: 81/15 cm/sec CCA: 100/21 cm/sec SYSTOLIC ICA/CCA RATIO:  0.8 ECA: 84 cm/sec LEFT ICA: 61/12 cm/sec CCA: 91/14 cm/sec SYSTOLIC ICA/CCA RATIO:  0.7 ECA: 77 cm/sec RIGHT CAROTID ARTERY: No significant atheromatous plaque. RIGHT VERTEBRAL  ARTERY:  Antegrade flow. LEFT CAROTID ARTERY:  No significant atheromatous plaque. LEFT VERTEBRAL ARTERY:  Antegrade flow. IMPRESSION: No significant stenosis of internal  carotid arteries. Electronically Signed   By: Acquanetta Belling M.D.   On: 11/19/2023 08:20        Scheduled Meds:  diclofenac Sodium  4 g Topical QID   DULoxetine  30 mg Oral Daily   enoxaparin (LOVENOX) injection  0.5 mg/kg Subcutaneous Q24H   estradiol  1 mg Oral Daily   gabapentin  300 mg Oral QHS   levothyroxine  100 mcg Oral Q0600   midodrine  5 mg Oral BID WC   pantoprazole  40 mg Oral QHS   rosuvastatin  20 mg Oral Daily   sodium chloride flush  3 mL Intravenous Q12H   Continuous Infusions:   LOS: 0 days       Tresa Moore, MD [facility]   If 7PM-7AM, please contact night-coverage  11/19/2023, 3:48 PM

## 2023-11-19 NOTE — Assessment & Plan Note (Signed)
-   We will continue statin therapy. 

## 2023-11-19 NOTE — ED Notes (Signed)
Pt eating lunch, denies any needs at this time.

## 2023-11-19 NOTE — Assessment & Plan Note (Signed)
-  The patient will be admitted to an observation medically monitored bed. - Will check orthostatics q 12 hours. - Will obtain a bilateral carotid Doppler and 2D echo. - The patient will be gently hydrated with IV normal saline and monitored for arrhythmias. -Differential diagnoses would include neurally mediated syncope, cardiogenic, arrhythmias related,  orthostatic hypotension and less likely hypoglycemia.

## 2023-11-19 NOTE — Assessment & Plan Note (Signed)
-   We will continue Synthroid. 

## 2023-11-19 NOTE — Evaluation (Signed)
Occupational Therapy Evaluation Patient Details Name: Janet Galvan MRN: [id number] DOB: Oct 08, 1946 Today's Date: 11/19/2023   History of Present Illness Pt is a 77 year old female presenting to the ED with acute onset of syncope    PMH significant for GERD, depression, hypertension, dyslipidemia and hypothyroidism,   Clinical Impression   Chart reviewed, pt greeted in ED stretcher, agreeable to OT evaluation. Pt is alert and oriented x4. PTA pt is MOD I-I in ADL/IADL, amb with cane home/community distances. Pt has Prn assist from daughter as needed. Pt presents with deficits in activity tolerance, balance, affecting safe and optimal ADL completion. Bed mobility completed with MIN A due to ED stretcher, STS with MIN A, amb via HHA approx 75' with close chair follow. Grooming tasks completed with SET UP. Vitals monitored, please refer to flowsheet.  Pt will benefit from acute OT to address functional deficits and to facilitate optimal ADL performance.       If plan is discharge home, recommend the following: A little help with walking and/or transfers;A little help with bathing/dressing/bathroom;Help with stairs or ramp for entrance;Assistance with cooking/housework    Functional Status Assessment  Patient has had a recent decline in their functional status and demonstrates the ability to make significant improvements in function in a reasonable and predictable amount of time.  Equipment Recommendations  2WW- pt says she may be able to get from a friend/family member    Recommendations for Other Services       Precautions / Restrictions Precautions Precautions: Fall Precaution Comments: watch bp      Mobility Bed Mobility Overal bed mobility: Needs Assistance Bed Mobility: Supine to Sit, Sit to Supine     Supine to sit: Supervision Sit to supine: Min assist (due to ED stretcher)        Transfers Overall transfer level: Needs assistance Equipment used: None Transfers:  Sit to/from Stand Sit to Stand: Min assist                  Balance Overall balance assessment: Needs assistance Sitting-balance support: Feet supported Sitting balance-Leahy Scale: Good     Standing balance support: Single extremity supported Standing balance-Leahy Scale: Good                             ADL either performed or assessed with clinical judgement   ADL Overall ADL's : Needs assistance/impaired Eating/Feeding: Set up;Sitting   Grooming: Set up;Sitting           Upper Body Dressing : Set up;Sitting Upper Body Dressing Details (indicate cue type and reason): anticipate Lower Body Dressing: Minimal assistance Lower Body Dressing Details (indicate cue type and reason): socks Toilet Transfer: Minimal assistance;Ambulation Toilet Transfer Details (indicate cue type and reason): simulated, with HHA and chair follow         Functional mobility during ADLs: Minimal assistance (approx 39ft with HHA, chair follow)       Vision Patient Visual Report: No change from baseline       Perception         Praxis         Pertinent Vitals/Pain Pain Assessment Pain Assessment: 0-10 Pain Score: 8  Pain Location: dorsal surface of R hand Pain Descriptors / Indicators: Sore Pain Intervention(s): Limited activity within patient's tolerance, Monitored during session     Extremity/Trunk Assessment Upper Extremity Assessment Upper Extremity Assessment: Overall WFL for tasks assessed   Lower Extremity Assessment  Lower Extremity Assessment: Overall WFL for tasks assessed   Cervical / Trunk Assessment Cervical / Trunk Assessment: Normal   Communication Communication Communication: No apparent difficulties Cueing Techniques: Verbal cues   Cognition Arousal: Alert Behavior During Therapy: WFL for tasks assessed/performed Overall Cognitive Status: Within Functional Limits for tasks assessed                                        General Comments  pt does not report dizziness/"hot flash" while amb, does report weakness after standing for approx 3 minutes    Exercises Other Exercises Other Exercises: edu re: role of OT, role of rehab, discharge recommendations, energy conservation techniques   Shoulder Instructions      Home Living Family/patient expects to be discharged to:: Private residence Living Arrangements: Alone Available Help at Discharge: Family;Available PRN/intermittently;Available 24 hours/day (daughter could stay 24/7 if needed per pt report) Type of Home: House (one level townhouse) Home Access: Level entry;Other (comment) (1 threshold step once you get into the house)     Home Layout: One level     Bathroom Shower/Tub: Tub/shower unit;Walk-in shower   Bathroom Toilet: Handicapped height Bathroom Accessibility: Yes How Accessible: Accessible via walker Home Equipment: Grab bars - toilet;Grab bars - tub/shower;BSC/3in1;Shower seat (hurrycane)          Prior Functioning/Environment Prior Level of Function : Independent/Modified Independent             Mobility Comments: amb with cane ADLs Comments: MOD I in ADL/IADL, family assists prn, cooks/cleans/drives has someone clean her house 1x per month        OT Problem List: Decreased activity tolerance;Decreased safety awareness;Impaired balance (sitting and/or standing);Decreased knowledge of use of DME or AE      OT Treatment/Interventions: Self-care/ADL training;DME and/or AE instruction;Therapeutic activities;Balance training;Therapeutic exercise;Energy conservation;Patient/family education    OT Goals(Current goals can be found in the care plan section) Acute Rehab OT Goals Patient Stated Goal: figure out what is going on OT Goal Formulation: With patient Time For Goal Achievement: 12/03/23 Potential to Achieve Goals: Good ADL Goals Pt Will Perform Grooming: with modified independence;sitting Pt Will Perform Lower Body  Dressing: with modified independence;sitting/lateral leans;sit to/from stand Pt Will Transfer to Toilet: with modified independence;ambulating Pt Will Perform Toileting - Clothing Manipulation and hygiene: with modified independence;sitting/lateral leans;sit to/from stand  OT Frequency: Min 1X/week    Co-evaluation              AM-PAC OT "6 Clicks" Daily Activity     Outcome Measure Help from another person eating meals?: None Help from another person taking care of personal grooming?: None Help from another person toileting, which includes using toliet, bedpan, or urinal?: None Help from another person bathing (including washing, rinsing, drying)?: A Little Help from another person to put on and taking off regular upper body clothing?: None Help from another person to put on and taking off regular lower body clothing?: A Little 6 Click Score: 22   End of Session Nurse Communication: Mobility status  Activity Tolerance: Patient tolerated treatment well Patient left: Other (comment) (at edge of bed with nursing)  OT Visit Diagnosis: Other abnormalities of gait and mobility (R26.89);Unsteadiness on feet (R26.81)                Time: 4097-3532 OT Time Calculation (min): 38 min Charges:  OT General Charges $OT Visit: 1 Visit OT  Evaluation $OT Eval Moderate Complexity: 1 Mod  Oleta Mouse, OTD OTR/L  11/19/23, 4:16 PM

## 2023-11-19 NOTE — ED Notes (Signed)
Rn informed bed assigned

## 2023-11-19 NOTE — ED Notes (Signed)
Linens and gown changed.

## 2023-11-19 NOTE — Assessment & Plan Note (Signed)
-   We will continue Neurontin. ?

## 2023-11-19 NOTE — Progress Notes (Signed)
*  PRELIMINARY RESULTS* Echocardiogram 2D Echocardiogram has been performed.  Carolyne Fiscal 11/19/2023, 12:52 PM

## 2023-11-19 NOTE — ED Notes (Signed)
 Pt up and walking with PT

## 2023-11-19 NOTE — Assessment & Plan Note (Signed)
-   We will continue PPI therapy 

## 2023-11-19 NOTE — ED Notes (Signed)
Pt ate 50% of her dinner, pt reports that she has lost her taste buds. Pt denied any other needs at this time

## 2023-11-19 NOTE — Progress Notes (Signed)
PHARMACIST - PHYSICIAN ORDER COMMUNICATION  CONCERNING: P&T Medication Policy on Herbal Medications  DESCRIPTION:  This patient's order for:  Biotin 5000 units  has been noted.  This product(s) is classified as an "herbal" or natural product. Due to a lack of definitive safety studies or FDA approval, nonstandard manufacturing practices, plus the potential risk of unknown drug-drug interactions while on inpatient medications, the Pharmacy and Therapeutics Committee does not permit the use of "herbal" or natural products of this type within [facility].   ACTION TAKEN: The pharmacy department is unable to verify this order at this time and your patient has been informed of this safety policy. Please reevaluate patient's clinical condition at discharge and address if the herbal or natural product(s) should be resumed at that time.

## 2023-11-20 DIAGNOSIS — R55 Syncope and collapse: Secondary | ICD-10-CM | POA: Diagnosis not present

## 2023-11-20 LAB — GLUCOSE, CAPILLARY: Glucose-Capillary: 86 mg/dL (ref 70–99)

## 2023-11-20 MED ORDER — NYSTATIN 100000 UNIT/GM EX POWD
Freq: Three times a day (TID) | CUTANEOUS | Status: DC
  Filled 2023-11-20: qty 15

## 2023-11-20 MED ORDER — DICLOFENAC SODIUM 1 % EX GEL: 4.0000 g | Freq: Four times a day (QID) | CUTANEOUS | Status: AC

## 2023-11-20 MED ORDER — MIDODRINE HCL 5 MG PO TABS: 5.0000 mg | ORAL_TABLET | Freq: Two times a day (BID) | ORAL | 0 refills | Status: AC

## 2023-11-20 MED ORDER — ORAL CARE MOUTH RINSE: 15.0000 mL | OROMUCOSAL | Status: DC | PRN

## 2023-11-20 NOTE — Plan of Care (Signed)

## 2023-11-20 NOTE — Progress Notes (Signed)
Order received from MD for nystatin powder to apply under the breast for yeast

## 2023-11-20 NOTE — Discharge Instructions (Signed)
[facility]  They will call you to schedule when they will come out to see you for nursing and Physical Therapy Home health care service [address]  [phone] Open ? Closes 5?PM

## 2023-11-20 NOTE — Care Management Obs Status (Signed)
MEDICARE OBSERVATION STATUS NOTIFICATION   Patient Details  Name: Janet Galvan MRN: [id number] Date of Birth: 16-Oct-1946   Medicare Observation Status Notification Given:  Yes    Chapman Fitch, RN 11/20/2023, 2:59 PM

## 2023-11-20 NOTE — Discharge Summary (Signed)
Physician Discharge Summary  Janet Galvan WUJ:[id number] DOB: December 25, 1945 DOA: 11/18/2023  PCP: Marguarite Arbour, MD  Admit date: 11/18/2023 Discharge date: 11/20/2023  Admitted From: Home Disposition:  Home  Recommendations for Outpatient Follow-up:  Follow up with PCP in 1-2 weeks Consider referral to ortho/hand  Home Health: Yes PT OT Equipment/Devices:None   Discharge Condition:Stable  CODE STATUS:DNR  Brief/Interim Summary:   77 y.o. female with medical history significant for GERD, depression, hypertension, dyslipidemia and hypothyroidism, who presented to the emergency room with acute onset of syncope.  The patient experienced dizziness and lightheadedness and collapsed in her daughter's arms.  No falls or injuries.  No chest pain or palpitations prior to or after her syncope.  She denied any paresthesias or focal muscle weakness.  No fever or chills.  She has been having cough for a week and was given a Z-Pak which helped with her symptoms.  No dysuria, oliguria or hematuria or flank pain.  No bleeding diathesis.   Inpatient workup overall reassuring.  Echocardiogram, carotid Doppler, CT angio reassuring.  Strong suspicion for orthostatic hypotension.  Initiated twice daily midodrine 5 mg.  Good improvement in symptoms and mobility at time of discharge.    Discharge Diagnoses:  Principal Problem:   Syncope Active Problems:   Hypothyroidism   Dyslipidemia   Peripheral neuropathy   GERD without esophagitis   Syncope Orthostatic hypotension Differential remains somewhat broad however orthostatic hypotension appears to be primary driver.  CT angio reviewed.  Negative for pulmonary embolism.  Carotid Doppler reassuring.  2D echocardiogram completed.  Reassuring with normal ejection fraction and no WMA.  Strong suspicion for orthostatic hypotension.  5 mg twice daily midodrine initiated with good improvement in symptoms. Plan: Discharge home.  Home health PT and OT  ordered.  Continue midodrine 5 mg twice daily.  Follow-up outpatient PCP 1 to 2 weeks.  Right hand pain Decreased grip strength Osteoarthritis Patient endorsed progressive swelling and pain in the right hand.  X-ray right hand and wrist ordered.  Multiple areas of subluxation within the metacarpal bones with significant osteoarthritis noted.  No joint effusions to suggest an inflammatory process. Plan: Voltaren 4 g 4 times daily Follow-up outpatient PCP Consider referral to orthopedics/sports medicine  Discharge Instructions  Discharge Instructions     Diet - low sodium heart healthy   Complete by: As directed    Increase activity slowly   Complete by: As directed       Allergies as of 11/20/2023       Reactions   Latex Rash, Other (See Comments)   Compression stockings cause blisters.  Bandaids cause red skin   Morphine Nausea Only   Other reaction(s): Vomiting   Ciprofloxacin Rash, Other (See Comments)   Blisters   Oxycodone Nausea Only   Ketoprofen Nausea Only   Morphine And Codeine Other (See Comments)   "crazy" - altered mental status   Amoxicillin Rash   Amoxicillin-pot Clavulanate Nausea Only, Rash   Bactrim [sulfamethoxazole-trimethoprim] Rash   Shrimp [shellfish Allergy] Rash        Medication List     TAKE these medications    acetaminophen 650 MG CR tablet Commonly known as: TYLENOL Take 1,300 mg by mouth every 8 (eight) hours as needed for pain.   aspirin EC 81 MG tablet Take 81 mg by mouth daily. Swallow whole.   Biotin 5000 MCG Tabs Take 5,000 mcg by mouth daily.   CRANBERRY-VITAMIN C PO Take 1 tablet by mouth in the morning  and at bedtime.   diclofenac sodium 1 % Gel Commonly known as: VOLTAREN Apply 2 g topically 2 (two) times daily as needed (pain). What changed: Another medication with the same name was added. Make sure you understand how and when to take each.   diclofenac Sodium 1 % Gel Commonly known as: VOLTAREN Apply 4 g  topically 4 (four) times daily. What changed: You were already taking a medication with the same name, and this prescription was added. Make sure you understand how and when to take each.   DULoxetine 30 MG capsule Commonly known as: CYMBALTA Take 30 mg by mouth daily.   estradiol 1 MG tablet Commonly known as: ESTRACE Take 1 mg by mouth daily.   gabapentin 100 MG capsule Commonly known as: NEURONTIN Take 300 mg by mouth at bedtime.   levothyroxine 100 MCG tablet Commonly known as: SYNTHROID Take 100 mcg by mouth daily.   midodrine 5 MG tablet Commonly known as: PROAMATINE Take 1 tablet (5 mg total) by mouth 2 (two) times daily with a meal.   nystatin cream Commonly known as: MYCOSTATIN Apply 1 Application topically 3 (three) times daily.   pantoprazole 40 MG tablet Commonly known as: PROTONIX Take 40 mg by mouth at bedtime.   phentermine 37.5 MG tablet Commonly known as: ADIPEX-P Take 37.5 mg by mouth daily.   raloxifene 60 MG tablet Commonly known as: EVISTA Take 60 mg by mouth daily.   rosuvastatin 20 MG tablet Commonly known as: CRESTOR Take 20 mg by mouth daily.   traMADol 50 MG tablet Commonly known as: ULTRAM Take 1 tablet (50 mg total) by mouth every 8 (eight) hours as needed for moderate pain.   TURMERIC CURCUMIN PO Take 1 capsule by mouth daily.        Follow-up Information     Marguarite Arbour, MD. Schedule an appointment as soon as possible for a visit in 1 week(s).   Specialty: Internal Medicine Contact information: [address] [facility] [place] [address] [phone]                Allergies  Allergen Reactions   Latex Rash and Other (See Comments)    Compression stockings cause blisters.  Bandaids cause red skin    Morphine Nausea Only    Other reaction(s): Vomiting   Ciprofloxacin Rash and Other (See Comments)    Blisters    Oxycodone Nausea Only   Ketoprofen Nausea Only   Morphine And Codeine  Other (See Comments)    "crazy" - altered mental status   Amoxicillin Rash   Amoxicillin-Pot Clavulanate Nausea Only and Rash   Bactrim [Sulfamethoxazole-Trimethoprim] Rash   Shrimp [Shellfish Allergy] Rash    Consultations: None   Procedures/Studies: DG Wrist Complete Right Result Date: 11/19/2023 CLINICAL DATA:  Right wrist and hand pain. EXAM: RIGHT WRIST - COMPLETE 3+ VIEW; RIGHT HAND - COMPLETE 3+ VIEW COMPARISON:  None Available. FINDINGS: There is no evidence of acute fracture. A small well corticated ossicle adjacent to the ulnar styloid likely relates to prior trauma. There is widening of the scapholunate interval measuring 5-6 mm with proximal migration of the capitate. Radiocarpal joint space narrowing and marginal osteophytosis is noted. There is flattening of the trapezium and trapezoid bones with suspected ulnar subluxation of the base of the second metacarpal relative to the trapezoid. There is severe joint space narrowing, subchondral sclerosis and marginal osteophytosis involving the first CMC and triscaphe articulations. Diffuse interphalangeal joint space narrowing is noted  most pronounced involving the second DIP joint with associated flexion deformity as well as the third DIP joint. IMPRESSION: 1. 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 There are secondary arthritic changes at the radiocarpal articulation. 2. Severe arthritic changes of the first [facility] and triscaphe articulations with morphological flattening of the trapezium and trapezoid bones. There is suspected ulnar subluxation of the base of second metacarpal relative to the trapezoid. 3. 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 Electronically Signed   By: Hart Robinsons M.D.   On: 11/19/2023 17:39   DG Hand Complete Right Result Date: 11/19/2023 CLINICAL DATA:  Right wrist and hand pain. EXAM: RIGHT WRIST - COMPLETE 3+ VIEW; RIGHT HAND - COMPLETE 3+  VIEW COMPARISON:  None Available. FINDINGS: There is no evidence of acute fracture. A small well corticated ossicle adjacent to the ulnar styloid likely relates to prior trauma. There is widening of the scapholunate interval measuring 5-6 mm with proximal migration of the capitate. Radiocarpal joint space narrowing and marginal osteophytosis is noted. There is flattening of the trapezium and trapezoid bones with suspected ulnar subluxation of the base of the second metacarpal relative to the trapezoid. There is severe joint space narrowing, subchondral sclerosis and marginal osteophytosis involving the first CMC and triscaphe articulations. Diffuse interphalangeal joint space narrowing is noted most pronounced involving the second DIP joint with associated flexion deformity as well as the third DIP joint. IMPRESSION: 1. 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 There are secondary arthritic changes at the radiocarpal articulation. 2. Severe arthritic changes of the first [facility] and triscaphe articulations with morphological flattening of the trapezium and trapezoid bones. There is suspected ulnar subluxation of the base of second metacarpal relative to the trapezoid. 3. 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 Electronically Signed   By: Hart Robinsons M.D.   On: 11/19/2023 17:39   ECHOCARDIOGRAM COMPLETE Result Date: 11/19/2023    ECHOCARDIOGRAM REPORT   Patient Name:   Janet Galvan Date of Exam: 11/19/2023 Medical Rec #:  [id number]          Height:       58.0 in Accession #:    [id number]         Weight:       191.0 lb Date of Birth:  Oct 06, 1946          BSA:          1.786 m Patient Age:    77 years           BP:           92/33 mmHg Patient Gender: F                  HR:           71 bpm. Exam Location:  [facility] Procedure: 2D Echo, Cardiac Doppler and Color Doppler Indications:     Syncope  History:         Patient has no prior history of  Echocardiogram examinations.                  Signs/Symptoms:Syncope; Risk Factors:Hypertension and                  Dyslipidemia. CKD.  Sonographer:     Mikki Harbor Referring Phys:  [id number] JAN A MANSY Diagnosing Phys: Mellody Drown Alluri IMPRESSIONS  1. Left ventricular ejection fraction, by estimation, is 55 to 60%. The left ventricle has normal  function. The left ventricle has no regional wall motion abnormalities. Left ventricular diastolic parameters were normal.  2. Right ventricular systolic function is normal. The right ventricular size is normal. There is normal pulmonary artery systolic pressure.  3. Left atrial size was mildly dilated.  4. The mitral valve is normal in structure. Trivial mitral valve regurgitation.  5. Tricuspid valve regurgitation is moderate.  6. The aortic valve is tricuspid. Aortic valve regurgitation is not visualized. FINDINGS  Left Ventricle: Left ventricular ejection fraction, by estimation, is 55 to 60%. The left ventricle has normal function. The left ventricle has no regional wall motion abnormalities. The left ventricular internal cavity size was normal in size. There is  no left ventricular hypertrophy. Left ventricular diastolic parameters were normal. Right Ventricle: The right ventricular size is normal. No increase in right ventricular wall thickness. Right ventricular systolic function is normal. There is normal pulmonary artery systolic pressure. The tricuspid regurgitant velocity is 2.58 m/s, and  with an assumed right atrial pressure of 3 mmHg, the estimated right ventricular systolic pressure is 29.6 mmHg. Left Atrium: Left atrial size was mildly dilated. Right Atrium: Right atrial size was normal in size. Pericardium: There is no evidence of pericardial effusion. Mitral Valve: The mitral valve is normal in structure. Trivial mitral valve regurgitation. MV peak gradient, 2.7 mmHg. The mean mitral valve gradient is 1.0 mmHg. Tricuspid Valve: The tricuspid valve is  normal in structure. Tricuspid valve regurgitation is moderate. Aortic Valve: The aortic valve is tricuspid. Aortic valve regurgitation is not visualized. Aortic valve mean gradient measures 3.0 mmHg. Aortic valve peak gradient measures 6.4 mmHg. Aortic valve area, by VTI measures 2.23 cm. Pulmonic Valve: The pulmonic valve was not well visualized. Pulmonic valve regurgitation is trivial. Aorta: The aortic root is normal in size and structure. IAS/Shunts: The interatrial septum was not assessed.  LEFT VENTRICLE PLAX 2D LVIDd:         4.80 cm     Diastology LVIDs:         3.30 cm     LV e' medial:    9.68 cm/s LV PW:         0.80 cm     LV E/e' medial:  7.4 LV IVS:        0.90 cm     LV e' lateral:   11.10 cm/s LVOT diam:     1.90 cm     LV E/e' lateral: 6.5 LV SV:         67 LV SV Index:   38 LVOT Area:     2.84 cm  LV Volumes (MOD) LV vol d, MOD A2C: 66.0 ml LV vol d, MOD A4C: 56.8 ml LV vol s, MOD A2C: 29.4 ml LV vol s, MOD A4C: 27.9 ml LV SV MOD A2C:     36.6 ml LV SV MOD A4C:     56.8 ml LV SV MOD BP:      33.5 ml RIGHT VENTRICLE RV Basal diam:  3.80 cm RV Mid diam:    2.70 cm RV S prime:     16.20 cm/s TAPSE (M-mode): 2.8 cm LEFT ATRIUM             Index        RIGHT ATRIUM           Index LA diam:        4.20 cm 2.35 cm/m   RA Area:     18.00 cm LA Vol (A2C):  61.6 ml 34.49 ml/m  RA Volume:   48.50 ml  27.16 ml/m LA Vol (A4C):   84.0 ml 47.04 ml/m LA Biplane Vol: 74.0 ml 41.44 ml/m  AORTIC VALVE                    PULMONIC VALVE AV Area (Vmax):    2.16 cm     PV Vmax:       1.33 m/s AV Area (Vmean):   2.04 cm     PV Peak grad:  7.1 mmHg AV Area (VTI):     2.23 cm AV Vmax:           126.00 cm/s AV Vmean:          87.300 cm/s AV VTI:            0.303 m AV Peak Grad:      6.4 mmHg AV Mean Grad:      3.0 mmHg LVOT Vmax:         95.80 cm/s LVOT Vmean:        62.700 cm/s LVOT VTI:          0.238 m LVOT/AV VTI ratio: 0.79  AORTA Ao Root diam: 3.00 cm MITRAL VALVE               TRICUSPID VALVE MV Area  (PHT): 3.34 cm    TR Peak grad:   26.6 mmHg MV Area VTI:   2.24 cm    TR Vmax:        258.00 cm/s MV Peak grad:  2.7 mmHg MV Mean grad:  1.0 mmHg    SHUNTS MV Vmax:       0.82 m/s    Systemic VTI:  0.24 m MV Vmean:      49.6 cm/s   Systemic Diam: 1.90 cm MV Decel Time: 227 msec MV E velocity: 71.70 cm/s MV A velocity: 75.40 cm/s MV E/A ratio:  0.95 Windell Norfolk Electronically signed by Windell Norfolk Signature Date/Time: 11/19/2023/4:27:05 PM    Final    CT Angio Chest Pulmonary Embolism (PE) W or WO Contrast Result Date: 11/19/2023 CLINICAL DATA:  Weakness, nausea and vomiting, diarrhea for 2 weeks EXAM: CT ANGIOGRAPHY CHEST WITH CONTRAST TECHNIQUE: Multidetector CT imaging of the chest was performed using the standard protocol during bolus administration of intravenous contrast. Multiplanar CT image reconstructions and MIPs were obtained to evaluate the vascular anatomy. RADIATION DOSE REDUCTION: This exam was performed according to the departmental dose-optimization program which includes automated exposure control, adjustment of the mA and/or kV according to patient size and/or use of iterative reconstruction technique. CONTRAST:  75mL OMNIPAQUE IOHEXOL 350 MG/ML SOLN COMPARISON:  None Available. FINDINGS: Cardiovascular: This is a technically adequate evaluation of the pulmonary vasculature. No filling defects or pulmonary emboli. Mild left atrial dilatation. Otherwise the heart is unremarkable without pericardial effusion. No evidence of thoracic aortic aneurysm or dissection. Atherosclerosis of the aorta and coronary vasculature. Mediastinum/Nodes: No enlarged mediastinal, hilar, or axillary lymph nodes. Thyroid gland, trachea, and esophagus demonstrate no significant findings. Small hiatal hernia. Lungs/Pleura: No acute airspace disease, effusion, or pneumothorax. Central airways are patent. Upper Abdomen: No acute abnormality. 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 No  acute or destructive bony abnormalities. Reconstructed images demonstrate no additional findings. Review of the MIP images confirms the above findings. IMPRESSION: 1. No evidence of pulmonary embolus. 2. No acute intrathoracic process. 3. Small hiatal hernia. 4. Aortic Atherosclerosis (ICD10-I70.0). Coronary artery atherosclerosis. Electronically Signed  By: Sharlet Salina M.D.   On: 11/19/2023 11:15   US Carotid Bilateral Result Date: 11/19/2023 CLINICAL DATA:  Syncope Hypertension Hyperlipidemia EXAM: BILATERAL CAROTID DUPLEX ULTRASOUND TECHNIQUE: Wallace Cullens scale imaging, color Doppler and duplex ultrasound were performed of bilateral carotid and vertebral arteries in the neck. COMPARISON:  None available FINDINGS: Criteria: Quantification of carotid stenosis is based on velocity parameters that correlate the residual internal carotid diameter with NASCET-based stenosis levels, using the diameter of the distal internal carotid lumen as the denominator for stenosis measurement. The following velocity measurements were obtained: RIGHT ICA: 81/15 cm/sec CCA: 100/21 cm/sec SYSTOLIC ICA/CCA RATIO:  0.8 ECA: 84 cm/sec LEFT ICA: 61/12 cm/sec CCA: 91/14 cm/sec SYSTOLIC ICA/CCA RATIO:  0.7 ECA: 77 cm/sec RIGHT CAROTID ARTERY: No significant atheromatous plaque. RIGHT VERTEBRAL ARTERY:  Antegrade flow. LEFT CAROTID ARTERY:  No significant atheromatous plaque. LEFT VERTEBRAL ARTERY:  Antegrade flow. IMPRESSION: No significant stenosis of internal carotid arteries. Electronically Signed   By: Acquanetta Belling M.D.   On: 11/19/2023 08:20      Subjective: Seen and examined on the day of discharge.  Stable no distress.  Appropriate for discharge home.  Discharge Exam: Vitals:   11/20/23 0858 11/20/23 0900  BP: (!) 78/64 112/74  Pulse: 75 87  Resp:    Temp:  98.2 F (36.8 C)  SpO2:     Vitals:   11/20/23 0735 11/20/23 0854 11/20/23 0858 11/20/23 0900  BP: (!) 127/47 (!) 125/52 (!) 78/64 112/74  Pulse: 61 66 75 87   Resp: 16     Temp: 98.2 F (36.8 C)   98.2 F (36.8 C)  TempSrc: Oral     SpO2: 97%     Weight:      Height:        General: Pt is alert, awake, not in acute distress Cardiovascular: RRR, S1/S2 +, no rubs, no gallops Respiratory: CTA bilaterally, no wheezing, no rhonchi Abdominal: Soft, NT, ND, bowel sounds + Extremities: no edema, no cyanosis    The results of significant diagnostics from this hospitalization (including imaging, microbiology, ancillary and laboratory) are listed below for reference.     Microbiology: No results found for this or any previous visit (from the past 240 hours).   Labs: BNP (last 3 results) No results for input(s): "BNP" in the last 8760 hours. Basic Metabolic Panel: Recent Labs  Lab 11/18/23 1433 11/19/23 0602  NA 137 138  K 3.8 3.6  CL 103 107  CO2 22 23  GLUCOSE 119* 93  BUN 17 16  CREATININE 1.12* 1.00  CALCIUM 9.2 8.6*   Liver Function Tests: Recent Labs  Lab 11/18/23 1433  AST 17  ALT 12  ALKPHOS 75  BILITOT 0.8  PROT 6.9  ALBUMIN 3.9   No results for input(s): "LIPASE", "AMYLASE" in the last 168 hours. No results for input(s): "AMMONIA" in the last 168 hours. CBC: Recent Labs  Lab 11/18/23 1433 11/19/23 0602  WBC 10.4 7.0  NEUTROABS 8.5*  --   HGB 14.1 12.8  HCT 42.3 38.2  MCV 90.2 91.8  PLT 243 213   Cardiac Enzymes: No results for input(s): "CKTOTAL", "CKMB", "CKMBINDEX", "TROPONINI" in the last 168 hours. BNP: Invalid input(s): "POCBNP" CBG: Recent Labs  Lab 11/20/23 0422  GLUCAP 86   D-Dimer No results for input(s): "DDIMER" in the last 72 hours. Hgb A1c No results for input(s): "HGBA1C" in the last 72 hours. Lipid Profile No results for input(s): "CHOL", "HDL", "LDLCALC", "TRIG", "CHOLHDL", "LDLDIRECT" in the  last 72 hours. Thyroid function studies No results for input(s): "TSH", "T4TOTAL", "T3FREE", "THYROIDAB" in the last 72 hours.  Invalid input(s): "FREET3" Anemia work up No results  for input(s): "VITAMINB12", "FOLATE", "FERRITIN", "TIBC", "IRON", "RETICCTPCT" in the last 72 hours. Urinalysis    Component Value Date/Time   COLORURINE YELLOW (A) 11/19/2023 2215   APPEARANCEUR HAZY (A) 11/19/2023 2215   LABSPEC >1.046 (H) 11/19/2023 2215   PHURINE 5.0 11/19/2023 2215   GLUCOSEU NEGATIVE 11/19/2023 2215   HGBUR NEGATIVE 11/19/2023 2215   BILIRUBINUR NEGATIVE 11/19/2023 2215   KETONESUR NEGATIVE 11/19/2023 2215   PROTEINUR NEGATIVE 11/19/2023 2215   NITRITE NEGATIVE 11/19/2023 2215   LEUKOCYTESUR MODERATE (A) 11/19/2023 2215   Sepsis Labs Recent Labs  Lab 11/18/23 1433 11/19/23 0602  WBC 10.4 7.0   Microbiology No results found for this or any previous visit (from the past 240 hours).   Time coordinating discharge: Over 30 minutes  SIGNED:   Tresa Moore, MD  [facility] 11/20/2023, 2:50 PM Pager   If 7PM-7AM, please contact night-coverage

## 2023-11-20 NOTE — Progress Notes (Signed)
Discharge instructions reviewed with the patient.  Patient sent out via wheelchair with belongings 

## 2023-11-20 NOTE — Progress Notes (Signed)
Mobility Specialist - Progress Note     11/20/23 1400  Orthostatic Lying   BP- Lying 126/57  Pulse- Lying 66  Orthostatic Sitting  BP- Sitting 128/59  Pulse- Sitting 78  Orthostatic Standing at 0 minutes  BP- Standing at 0 minutes 101/66  Pulse- Standing at 0 minutes 98  Orthostatic Standing at 3 minutes  BP- Standing at 3 minutes 155/84 (walk around NS)  Pulse- Standing at 3 minutes 80  Mobility  Activity Ambulated with assistance in hallway;Stood at bedside  Level of Assistance Standby assist, set-up cues, supervision of patient - no hands on  Assistive Device Front wheel walker  Distance Ambulated (ft) 90 ft  Range of Motion/Exercises Active  Activity Response Tolerated well  Mobility Referral Yes  Mobility visit 1 Mobility   Pt orthostatic results listed above: Pt resting in bed upon entry on RA. Pt endorses dizziness during standing for 2 minutes (orthostatic vitals) but recovers after sitting for 2 minutes. Pt STS and ambulates to hallway around NS for 90 ft with RW SBA. Pt returned to bed and left with needs in reach. RN notified of results    Janet Galvan Mobility Specialist 11/20/23, 3:21 PM

## 2023-11-20 NOTE — TOC Initial Note (Signed)
Transition of Care [facility]) - Initial/Assessment Note    Patient Details  Name: Janet Galvan MRN: [id number] Date of Birth: 30-Nov-1946  Transition of Care [facility]) CM/SW Contact:    Chapman Fitch, RN Phone Number: 11/20/2023, 3:01 PM  Clinical Narrative:                  Admitted for: orthostatic hypotension Admitted from: home alone PCP: [place]  Current home health/prior home health/DME: [facility], rollator, shower chair, RW     Therapy recommending home health.  Patient states that she prefers [facility], but that if they can't accept she doesn't have a preference.  Per [place] with [facility] they do not have the staffing.  Elnita Maxwell with [facility] is able to accept    Patient Goals and CMS Choice            Expected Discharge Plan and Services         Expected Discharge Date: 11/20/23                                    Prior Living Arrangements/Services                       Activities of Daily Living   ADL Screening (condition at time of admission) Independently performs ADLs?: Yes (appropriate for developmental age) Is the patient deaf or have difficulty hearing?: No Does the patient have difficulty seeing, even when wearing glasses/contacts?: No Does the patient have difficulty concentrating, remembering, or making decisions?: No  Permission Sought/Granted                  Emotional Assessment              Admission diagnosis:  Nausea [R11.0] Syncope [R55] Syncope, unspecified syncope type [R55] Patient Active Problem List   Diagnosis Date Noted   Peripheral neuropathy 11/19/2023   Dyslipidemia 11/19/2023   GERD without esophagitis 11/19/2023   Syncope 11/18/2023   Allergic rhinitis 05/28/2022   Osteoarthritis of multiple joints 05/28/2022   Venous stasis 05/28/2022   Status post total knee replacement using cement, right 11/29/2021   Pharyngoesophageal dysphagia 05/29/2021   Acute pyelonephritis 11/06/2020   E coli  bacteremia 11/06/2020   GERD (gastroesophageal reflux disease)    Hyperlipidemia    Hypertension    Hypothyroidism    Depression    CKD (chronic kidney disease), stage IIIa    Hydronephrosis of right kidney    Primary osteoarthritis of right knee 08/27/2019   Primary osteoarthritis of left shoulder 01/20/2017   Lymphedema of both lower extremities 04/28/2015   PCP:  Marguarite Arbour, MD Pharmacy:   [facility] [phone] [place] D[place], [place] - [address] AT [facility] [address] [place] [address] [place] [address] Phone: [phone] Fax: [phone]  [facility]) - [place], [place] - [address] [facility] [address] [place] [address] [place] [address] Phone: [phone] Fax: [phone]     Social Drivers of Health (SDOH) Social History: SDOH Screenings   Food Insecurity: No Food Insecurity (11/19/2023)  Housing: Low Risk  (11/19/2023)  Transportation Needs: No Transportation Needs (11/19/2023)  Utilities: Not At Risk (11/19/2023)  Financial Resource Strain: Low Risk  (06/16/2023)   Received from [facility]  Tobacco Use: Low Risk  (11/18/2023)   SDOH Interventions:  Readmission Risk Interventions     No data to display

## 2024-03-02 ENCOUNTER — Encounter: Payer: Self-pay | Admitting: Internal Medicine

## 2024-03-03 ENCOUNTER — Ambulatory Visit: Admitting: Anesthesiology

## 2024-03-03 ENCOUNTER — Ambulatory Visit
Admission: RE | Admit: 2024-03-03 | Discharge: 2024-03-03 | Disposition: A | Discharge status: Home / Self Care | Source: Ambulatory Visit | Attending: Internal Medicine | Admitting: Internal Medicine

## 2024-03-03 ENCOUNTER — Encounter
Admission: RE | Disposition: A | Discharge status: Home / Self Care | Payer: Self-pay | Source: Ambulatory Visit | Attending: Internal Medicine

## 2024-03-03 DIAGNOSIS — I129 Hypertensive chronic kidney disease with stage 1 through stage 4 chronic kidney disease, or unspecified chronic kidney disease: Secondary | ICD-10-CM | POA: Diagnosis not present

## 2024-03-03 DIAGNOSIS — Z79890 Hormone replacement therapy (postmenopausal): Secondary | ICD-10-CM | POA: Insufficient documentation

## 2024-03-03 DIAGNOSIS — R1314 Dysphagia, pharyngoesophageal phase: Secondary | ICD-10-CM | POA: Insufficient documentation

## 2024-03-03 DIAGNOSIS — N189 Chronic kidney disease, unspecified: Secondary | ICD-10-CM | POA: Diagnosis not present

## 2024-03-03 DIAGNOSIS — K2100 Gastro-esophageal reflux disease with esophagitis, without bleeding: Secondary | ICD-10-CM | POA: Diagnosis not present

## 2024-03-03 DIAGNOSIS — E039 Hypothyroidism, unspecified: Secondary | ICD-10-CM | POA: Diagnosis not present

## 2024-03-03 DIAGNOSIS — Z79899 Other long term (current) drug therapy: Secondary | ICD-10-CM | POA: Diagnosis not present

## 2024-03-03 HISTORY — PX: ESOPHAGOGASTRODUODENOSCOPY: SHX5428

## 2024-03-03 HISTORY — PX: MALONEY DILATION: SHX5535

## 2024-03-03 SURGERY — EGD (ESOPHAGOGASTRODUODENOSCOPY)
Anesthesia: General

## 2024-03-03 MED ORDER — PROPOFOL 10 MG/ML IV BOLUS
INTRAVENOUS | Status: AC
  Filled 2024-03-03: qty 20

## 2024-03-03 MED ORDER — PROPOFOL 500 MG/50ML IV EMUL
INTRAVENOUS | Status: DC | PRN
  Administered 2024-03-03: 75 ug/kg/min via INTRAVENOUS

## 2024-03-03 MED ORDER — LIDOCAINE HCL (CARDIAC) PF 100 MG/5ML IV SOSY
PREFILLED_SYRINGE | INTRAVENOUS | Status: DC | PRN
  Administered 2024-03-03: 60 mg via INTRAVENOUS

## 2024-03-03 MED ORDER — GLYCOPYRROLATE 0.2 MG/ML IJ SOLN
INTRAMUSCULAR | Status: AC
  Filled 2024-03-03: qty 1

## 2024-03-03 MED ORDER — SODIUM CHLORIDE 0.9 % IV SOLN
INTRAVENOUS | Status: DC
  Administered 2024-03-03: 20 mL/h via INTRAVENOUS

## 2024-03-03 MED ORDER — PROPOFOL 10 MG/ML IV BOLUS
INTRAVENOUS | Status: DC | PRN
  Administered 2024-03-03 (×2): 40 mg via INTRAVENOUS

## 2024-03-03 MED ORDER — GLYCOPYRROLATE 0.2 MG/ML IJ SOLN
INTRAMUSCULAR | Status: DC | PRN
  Administered 2024-03-03: .2 mg via INTRAVENOUS

## 2024-03-03 NOTE — Interval H&P Note (Signed)
 History and Physical Interval Note:  03/03/2024 1:13 PM  Janet Galvan  has presented today for surgery, with the diagnosis of R13.10 (ICD-10-CM) - Dysphagia, unspecified type K21.9 (ICD-10-CM) - Gastroesophageal reflux disease, unspecified whether esophagitis present.  The various methods of treatment have been discussed with the patient and family. After consideration of risks, benefits and other options for treatment, the patient has consented to  Procedure(s): EGD (ESOPHAGOGASTRODUODENOSCOPY) (N/A) as a surgical intervention.  The patient's history has been reviewed, patient examined, no change in status, stable for surgery.  I have reviewed the patient's chart and labs.  Questions were answered to the patient's satisfaction.     [employer] [place]

## 2024-03-03 NOTE — Op Note (Signed)
 [facility] Gastroenterology Patient Name: Janet Galvan Procedure Date: 03/03/2024 1:08 PM MRN: [id number] Account #: [id number] Date of Birth: Oct 12, 1946 Admit Type: Outpatient Age: 78 Room: [facility] ENDO ROOM 2 Gender: Female Note Status: Finalized Instrument Name: Upper Endoscope [id number] Procedure:             Upper GI endoscopy Indications:           Esophageal dysphagia, Gastro-esophageal reflux disease Providers:             Boykin Nearing. Norma Fredrickson MD, MD Referring MD:          Duane Lope. Judithann Sheen, MD (Referring MD) Medicines:             Propofol per Anesthesia Complications:         No immediate complications. Estimated blood loss: None. Procedure:             Pre-Anesthesia Assessment:                        - The risks and benefits of the procedure and the                         sedation options and risks were discussed with the                         patient. All questions were answered and informed                         consent was obtained.                        - Patient identification and proposed procedure were                         verified prior to the procedure by the nurse. The                         procedure was verified in the procedure room.                        - ASA Grade Assessment: III - A patient with severe                         systemic disease.                        - After reviewing the risks and benefits, the patient                         was deemed in satisfactory condition to undergo the                         procedure.                        After obtaining informed consent, the endoscope was                         passed under direct vision. Throughout the procedure,  the patient's blood pressure, pulse, and oxygen                         saturations were monitored continuously. The                         Endosonoscope was introduced through the mouth, and                         advanced to  the third part of duodenum. The upper GI                         endoscopy was accomplished without difficulty. The                         patient tolerated the procedure well. Findings:      No endoscopic abnormality was evident in the esophagus to explain the       patient's complaint of dysphagia. It was decided, however, to proceed       with dilation in the distal esophagus. The scope was withdrawn. Dilation       was performed with a Maloney dilator with no resistance at 50 Fr.      The stomach was normal.      The examined duodenum was normal.      The exam was otherwise without abnormality. Impression:            - No endoscopic esophageal abnormality to explain                         patient's dysphagia. Esophagus dilated. Dilated.                        - Normal stomach.                        - Normal examined duodenum.                        - The examination was otherwise normal.                        - No specimens collected. Recommendation:        - Patient has a contact number available for                         emergencies. The signs and symptoms of potential                         delayed complications were discussed with the patient.                         Return to normal activities tomorrow. Written                         discharge instructions were provided to the patient.                        - Resume previous diet.                        -  Continue present medications.                        - Follow up with Tawni Pummel, PA-C at [facility]. [phone].                        - Telephone GI office to schedule appointment in 3                         months.                        - The findings and recommendations were discussed with                         the patient. Procedure Code(s):     --- Professional ---                        [phone], Esophagogastroduodenoscopy, flexible,                          transoral; diagnostic, including collection of                         specimen(s) by brushing or washing, when performed                         (separate procedure)                        43450, Dilation of esophagus, by unguided sound or                         bougie, single or multiple passes Diagnosis Code(s):     --- Professional ---                        K21.9, Gastro-esophageal reflux disease without                         esophagitis                        R13.14, Dysphagia, pharyngoesophageal phase CPT copyright 2022 American Medical Association. All rights reserved. The codes documented in this report are preliminary and upon coder review may  be revised to meet current compliance requirements. [name] MD, MD 03/03/2024 1:34:00 PM This report has been signed electronically. Number of Addenda: 0 Note Initiated On: 03/03/2024 1:08 PM Estimated Blood Loss:  Estimated blood loss: none.      [facility]

## 2024-03-03 NOTE — Anesthesia Postprocedure Evaluation (Signed)
 Anesthesia Post Note  Patient: Janet Galvan  Procedure(s) Performed: EGD (ESOPHAGOGASTRODUODENOSCOPY)  Patient location during evaluation: Endoscopy Anesthesia Type: General Level of consciousness: awake and alert Pain management: pain level controlled Vital Signs Assessment: post-procedure vital signs reviewed and stable Respiratory status: spontaneous breathing, nonlabored ventilation, respiratory function stable and patient connected to nasal cannula oxygen Cardiovascular status: blood pressure returned to baseline and stable Postop Assessment: no apparent nausea or vomiting Anesthetic complications: no   No notable events documented.   Last Vitals:  Vitals:   03/03/24 1336 03/03/24 1349  BP: (!) 112/56 134/62  Pulse: 88 90  Resp: 17 (!) 21  Temp:    SpO2: 97% 98%    Last Pain:  Vitals:   03/03/24 1349  TempSrc:   PainSc: 0-No pain                 Corinda Gubler

## 2024-03-03 NOTE — Anesthesia Preprocedure Evaluation (Signed)
 Anesthesia Evaluation  Patient identified by MRN, date of birth, ID band Patient awake    Reviewed: Allergy & Precautions, NPO status , Patient's Chart, lab work & pertinent test results  History of Anesthesia Complications Negative for: history of anesthetic complications  Airway Mallampati: II  TM Distance: >3 FB Neck ROM: Full    Dental no notable dental hx. (+) Teeth Intact   Pulmonary neg pulmonary ROS, neg sleep apnea, neg COPD, Patient abstained from smoking.Not current smoker   Pulmonary exam normal breath sounds clear to auscultation       Cardiovascular Exercise Tolerance: Good METShypertension, Pt. on medications (-) CAD and (-) Past MI (-) dysrhythmias  Rhythm:Regular Rate:Normal - Systolic murmurs    Neuro/Psych  PSYCHIATRIC DISORDERS  Depression    negative neurological ROS     GI/Hepatic hiatal hernia,GERD  ,,(+)     (-) substance abuse    Endo/Other  neg diabetesHypothyroidism    Renal/GU CRFRenal disease     Musculoskeletal   Abdominal   Peds  Hematology   Anesthesia Other Findings Past Medical History: No date: Allergic rhinitis No date: Chronic kidney disease No date: Depression No date: GERD (gastroesophageal reflux disease) No date: History of colon polyps No date: History of hiatal hernia No date: Hyperlipidemia No date: Hypertension No date: Hypothyroidism No date: Lymphedema of both lower extremities No date: Obesity No date: Osteoarthritis No date: Venous stasis  Reproductive/Obstetrics                             Anesthesia Physical Anesthesia Plan  ASA: 2  Anesthesia Plan: General   Post-op Pain Management: Minimal or no pain anticipated   Induction: Intravenous  PONV Risk Score and Plan: 3 and Propofol infusion, TIVA and Ondansetron  Airway Management Planned: Nasal Cannula  Additional Equipment: None  Intra-op Plan:   Post-operative  Plan:   Informed Consent: I have reviewed the patients History and Physical, chart, labs and discussed the procedure including the risks, benefits and alternatives for the proposed anesthesia with the patient or authorized representative who has indicated his/her understanding and acceptance.     Dental advisory given  Plan Discussed with: CRNA and Surgeon  Anesthesia Plan Comments: (Discussed risks of anesthesia with patient, including possibility of difficulty with spontaneous ventilation under anesthesia necessitating airway intervention, PONV, and rare risks such as cardiac or respiratory or neurological events, and allergic reactions. Discussed the role of CRNA in patient's perioperative care. Patient understands.)       Anesthesia Quick Evaluation

## 2024-03-03 NOTE — Transfer of Care (Signed)
 Immediate Anesthesia Transfer of Care Note  Patient: Janet Galvan  Procedure(s) Performed: EGD (ESOPHAGOGASTRODUODENOSCOPY)  Patient Location: PACU  Anesthesia Type:General  Level of Consciousness: sedated  Airway & Oxygen Therapy: Patient Spontanous Breathing  Post-op Assessment: Report given to RN and Post -op Vital signs reviewed and stable  Post vital signs: Reviewed and stable  Last Vitals:  Vitals Value Taken Time  BP    Temp    Pulse 88 03/03/24 1336  Resp 17 03/03/24 1336  SpO2 97 % 03/03/24 1336  Vitals shown include unfiled device data.  Last Pain:  Vitals:   03/03/24 1241  TempSrc: Temporal  PainSc: 0-No pain         Complications: No notable events documented.

## 2024-03-03 NOTE — H&P (Signed)
 Outpatient short stay form Pre-procedure 03/03/2024 1:10 PM Janet Galvan K. Norma Fredrickson, M.D.  Primary Physician: Aram Beecham, M.D.  Reason for visit:  pharyngoesophageal dysphagia, GERD.  History of present illness:  Summary of evaluation: -Janet Galvan reports she previously was having episodes with a lot of urgency, abdominal pain and nausea. Also had intermittent diarrhea. Was recommended to start Benefiber and she feels this drastically helped her symptoms. She previously was having symptoms several times a week, more recently has only had 2 episodes in the past 4 months. Very pleased with results of Benefiber. She denies any rectal bleeding or melena. Unsure what she ate before the 2 episodes she had over the past several months but does reports she enjoys [place] and it may have been [place].  She does feel that since her last visit her dysphagia has worsened. She endorses issues with foods such as broccoli and lettuce sticking in her upper esophageal area. She has not noted regurgitate but will drink liquid to get the vegetables to pass. She denies liquid or pill dysphagia. She is not feeling a lot of heartburn symptoms on her daily Protonix. She denies nausea vomiting.  She denies tobacco alcohol or NSAIDs.  -    Current Facility-Administered Medications:    0.9 %  sodium chloride infusion, , Intravenous, Continuous, Karsynn Deweese, Boykin Nearing, MD, Last Rate: 20 mL/hr at 03/03/24 1259, Continued from Pre-op at 03/03/24 1259  Medications Prior to Admission  Medication Sig Dispense Refill Last Dose/Taking   acetaminophen (TYLENOL) 650 MG CR tablet Take 1,300 mg by mouth every 8 (eight) hours as needed for pain.   03/02/2024   aspirin EC 81 MG tablet Take 81 mg by mouth daily. Swallow whole.   Past Week   Biotin 5000 MCG TABS Take 5,000 mcg by mouth daily.   03/02/2024   CRANBERRY-VITAMIN C PO Take 1 tablet by mouth in the morning and at bedtime.   03/02/2024   diclofenac sodium (VOLTAREN) 1 % GEL Apply 2 g  topically 2 (two) times daily as needed (pain).   03/02/2024   diclofenac Sodium (VOLTAREN) 1 % GEL Apply 4 g topically 4 (four) times daily.   03/02/2024   DULoxetine (CYMBALTA) 30 MG capsule Take 30 mg by mouth daily.   03/02/2024   estradiol (ESTRACE) 1 MG tablet Take 1 mg by mouth daily.   03/02/2024   gabapentin (NEURONTIN) 100 MG capsule Take 300 mg by mouth at bedtime.   03/02/2024   levothyroxine (SYNTHROID) 100 MCG tablet Take 100 mcg by mouth daily.   03/02/2024   nystatin cream (MYCOSTATIN) Apply 1 Application topically 3 (three) times daily.   03/02/2024   pantoprazole (PROTONIX) 40 MG tablet Take 40 mg by mouth at bedtime.   03/02/2024   phentermine (ADIPEX-P) 37.5 MG tablet Take 37.5 mg by mouth daily.   03/03/2024 at  8:30 AM   raloxifene (EVISTA) 60 MG tablet Take 60 mg by mouth daily.   03/02/2024   rosuvastatin (CRESTOR) 20 MG tablet Take 20 mg by mouth daily.   03/02/2024   traMADol (ULTRAM) 50 MG tablet Take 1 tablet (50 mg total) by mouth every 8 (eight) hours as needed for moderate pain. 30 tablet 0 03/02/2024   TURMERIC CURCUMIN PO Take 1 capsule by mouth daily.   03/02/2024     Allergies  Allergen Reactions   Latex Rash and Other (See Comments)    Compression stockings cause blisters.  Bandaids cause red skin    Morphine Nausea Only  Other reaction(s): Vomiting   Ciprofloxacin Rash and Other (See Comments)    Blisters    Oxycodone Nausea Only   Ketoprofen Nausea Only   Morphine And Codeine Other (See Comments)    "crazy" - altered mental status   Amoxicillin Rash   Amoxicillin-Pot Clavulanate Nausea Only and Rash   Bactrim [Sulfamethoxazole-Trimethoprim] Rash   Shrimp [Shellfish Allergy] Rash     Past Medical History:  Diagnosis Date   Allergic rhinitis    Chronic kidney disease    Depression    GERD (gastroesophageal reflux disease)    History of colon polyps    History of hiatal hernia    Hyperlipidemia    Hypertension    Hypothyroidism    Lymphedema of both lower  extremities    Obesity    Osteoarthritis    Venous stasis     Review of systems:  Otherwise negative.    Physical Exam  Gen: Alert, oriented. Appears stated age.  HEENT: [place]/AT. PERRLA. Lungs: CTA, no wheezes. CV: RR nl S1, S2. Abd: soft, benign, no masses. BS+ Ext: No edema. Pulses 2+    Planned procedures: Proceed with EGD. The patient understands the nature of the planned procedure, indications, risks, alternatives and potential complications including but not limited to bleeding, infection, perforation, damage to internal organs and possible oversedation/side effects from anesthesia. The patient agrees and gives consent to proceed.  Please refer to procedure notes for findings, recommendations and patient disposition/instructions.     [name] K. Norma Fredrickson, M.D. Gastroenterology 03/03/2024  1:10 PM

## 2024-03-04 ENCOUNTER — Encounter: Payer: Self-pay | Admitting: Internal Medicine

## 2024-04-20 ENCOUNTER — Encounter (INDEPENDENT_AMBULATORY_CARE_PROVIDER_SITE_OTHER): Payer: Self-pay

## 2024-05-26 ENCOUNTER — Other Ambulatory Visit: Payer: Self-pay | Admitting: Internal Medicine

## 2024-05-26 DIAGNOSIS — Z1231 Encounter for screening mammogram for malignant neoplasm of breast: Secondary | ICD-10-CM

## 2024-06-02 ENCOUNTER — Encounter

## 2024-08-25 ENCOUNTER — Ambulatory Visit
Admission: RE | Admit: 2024-08-25 | Discharge: 2024-08-25 | Disposition: A | Discharge status: Home / Self Care | Source: Ambulatory Visit | Attending: Internal Medicine | Admitting: Internal Medicine

## 2024-08-25 DIAGNOSIS — Z1231 Encounter for screening mammogram for malignant neoplasm of breast: Secondary | ICD-10-CM | POA: Insufficient documentation

## 2024-09-24 ENCOUNTER — Ambulatory Visit (INDEPENDENT_AMBULATORY_CARE_PROVIDER_SITE_OTHER): Payer: Medicare Other | Admitting: Nurse Practitioner

## 2024-09-24 ENCOUNTER — Encounter (INDEPENDENT_AMBULATORY_CARE_PROVIDER_SITE_OTHER): Payer: Self-pay | Admitting: Nurse Practitioner

## 2024-09-24 VITALS — BP 156/78 | HR 78 | Resp 16 | Wt 180.0 lb

## 2024-09-24 DIAGNOSIS — I10 Essential (primary) hypertension: Secondary | ICD-10-CM | POA: Diagnosis not present

## 2024-09-24 DIAGNOSIS — I890 Lymphedema, not elsewhere classified: Secondary | ICD-10-CM

## 2024-09-24 DIAGNOSIS — N1831 Chronic kidney disease, stage 3a: Secondary | ICD-10-CM | POA: Diagnosis not present

## 2024-09-26 ENCOUNTER — Encounter (INDEPENDENT_AMBULATORY_CARE_PROVIDER_SITE_OTHER): Payer: Self-pay | Admitting: Nurse Practitioner

## 2024-09-26 NOTE — Progress Notes (Signed)
 Subjective:    Patient ID: Janet Galvan, female    DOB: 1946/03/13, 78 y.o.   MRN: [id number] Chief Complaint  Patient presents with   Follow-up    70yr follow up    Janet Galvan is a 78 y.o. female.  She returns today for evaluation of her lower extremity leg swelling and lymphedema.  Currently she has been doing very well with her lower extremity edema.  There has been no episodes of cellulitis.  There is currently no open wounds or ulcerations or weeping of the lower extremities.  She is doing very well with elevation and activity.  She also utilizes a lymphedema pump as necessary.    Review of Systems  Cardiovascular:  Positive for leg swelling.  All other systems reviewed and are negative.      Objective:   Physical Exam Vitals reviewed.  HENT:     Head: Normocephalic.  Cardiovascular:     Rate and Rhythm: Normal rate and regular rhythm.  Pulmonary:     Effort: Pulmonary effort is normal.  Skin:    General: Skin is warm and dry.  Neurological:     Mental Status: She is alert and oriented to person, place, and time.  Psychiatric:        Mood and Affect: Mood normal.        Behavior: Behavior normal.        Thought Content: Thought content normal.        Judgment: Judgment normal.     BP (!) 156/78   Pulse 78   Resp 16   Wt 180 lb (81.6 kg)   LMP  (LMP Unknown)   BMI 38.95 kg/m   Past Medical History:  Diagnosis Date   Allergic rhinitis    Chronic kidney disease    Depression    GERD (gastroesophageal reflux disease)    History of colon polyps    History of hiatal hernia    Hyperlipidemia    Hypertension    Hypothyroidism    Lymphedema of both lower extremities    Obesity    Osteoarthritis    Venous stasis     Social History   Socioeconomic History   Marital status: Divorced    Spouse name: Not on file   Number of children: Not on file   Years of education: Not on file   Highest education level: Not on file  Occupational History    Not on file  Tobacco Use   Smoking status: Never   Smokeless tobacco: Never  Vaping Use   Vaping status: Never Used  Substance and Sexual Activity   Alcohol use: Not Currently   Drug use: Never   Sexual activity: Not on file  Other Topics Concern   Not on file  Social History Narrative   Not on file   Social Drivers of Health   Financial Resource Strain: Low Risk  (06/06/2024)   Received from [facility]   Overall Financial Resource Strain (CARDIA)    Difficulty of Paying Living Expenses: Not hard at all  Food Insecurity: No Food Insecurity (06/06/2024)   Received from [facility]   Hunger Vital Sign    Within the past 12 months, you worried that your food would run out before you got the money to buy more.: Never true    Within the past 12 months, the food you bought just didn't last and you didn't have money to get more.: Never true  Transportation Needs: No Transportation Needs (06/06/2024)   Received from [facility]    In the past 12 months, has lack of transportation kept you from medical appointments or from getting medications?: No    Lack of Transportation (Non-Medical): No  Physical Activity: Not on file  Stress: Not on file  Social Connections: Not on file  Intimate Partner Violence: Not At Risk (11/19/2023)   Humiliation, Afraid, Rape, and Kick questionnaire    Fear of Current or Ex-Partner: No    Emotionally Abused: No    Physically Abused: No    Sexually Abused: No    Past Surgical History:  Procedure Laterality Date   ABDOMINAL HYSTERECTOMY     ANKLE ARTHROSCOPY     BACK SURGERY     CARPAL TUNNEL RELEASE Right    carpal tunnell release     done endoscopic   CATARACT EXTRACTION W/PHACO Right 09/28/2018   Procedure: CATARACT EXTRACTION PHACO AND INTRAOCULAR LENS PLACEMENT (IOC)  RIGHT;  Surgeon: Myrna Adine Anes, MD;  Location: [facility];  Service: Ophthalmology;   Laterality: Right;   CATARACT EXTRACTION W/PHACO Left 10/19/2018   Procedure: CATARACT EXTRACTION PHACO AND INTRAOCULAR LENS PLACEMENT (IOC) LEFT;  Surgeon: Myrna Adine Anes, MD;  Location: [facility];  Service: Ophthalmology;  Laterality: Left;   CHOLECYSTECTOMY     COLONOSCOPY WITH PROPOFOL  N/A 06/02/2018   Procedure: COLONOSCOPY WITH PROPOFOL ;  Surgeon: Toledo, Ladell POUR, MD;  Location: [facility];  Service: Gastroenterology;  Laterality: N/A;   ESOPHAGOGASTRODUODENOSCOPY N/A 06/20/2021   Procedure: ESOPHAGOGASTRODUODENOSCOPY (EGD);  Surgeon: Toledo, Ladell POUR, MD;  Location: [facility];  Service: Gastroenterology;  Laterality: N/A;   ESOPHAGOGASTRODUODENOSCOPY N/A 03/03/2024   Procedure: EGD (ESOPHAGOGASTRODUODENOSCOPY);  Surgeon: Toledo, Ladell POUR, MD;  Location: [facility];  Service: Gastroenterology;  Laterality: N/A;   ESOPHAGOGASTRODUODENOSCOPY (EGD) WITH PROPOFOL  N/A 06/02/2018   Procedure: ESOPHAGOGASTRODUODENOSCOPY (EGD) WITH PROPOFOL ;  Surgeon: Toledo, Ladell POUR, MD;  Location: [facility];  Service: Gastroenterology;  Laterality: N/A;   EYE SURGERY Bilateral 2016   Cataracts   JOINT REPLACEMENT     MALONEY DILATION  03/03/2024   Procedure: DILATION, ESOPHAGUS, USING MALONEY DILATOR;  Surgeon: Toledo, Ladell POUR, MD;  Location: [facility];  Service: Gastroenterology;;   TONSILLECTOMY     TOTAL KNEE ARTHROPLASTY Right 11/29/2021   Procedure: TOTAL KNEE ARTHROPLASTY;  Surgeon: Edie Norleen PARAS, MD;  Location: [facility];  Service: Orthopedics;  Laterality: Right;   TOTAL SHOULDER REPLACEMENT Left    TUBAL LIGATION      Family History  Problem Relation Age of Onset   Breast cancer Maternal Aunt 48    Allergies  Allergen Reactions   Latex Rash and Other (See Comments)    Compression stockings cause blisters.  Bandaids cause red skin    Morphine Nausea Only    Other reaction(s): Vomiting   Ciprofloxacin Rash and Other (See Comments)    Blisters     Oxycodone Nausea Only   Ketoprofen Nausea Only   Morphine And Codeine Other (See Comments)    crazy - altered mental status   Amoxicillin Rash   Amoxicillin-Pot Clavulanate Nausea Only and Rash   Bactrim  [Sulfamethoxazole -Trimethoprim ] Rash   Shrimp [Shellfish Allergy] Rash       Latest Ref Rng & Units 11/19/2023    6:02 AM 11/18/2023    2:33 PM 12/01/2021    5:42 AM  CBC  WBC 4.0 - 10.5 K/uL 7.0  10.4  9.0   Hemoglobin  12.0 - 15.0 g/dL 87.1  85.8  89.5   Hematocrit 36.0 - 46.0 % 38.2  42.3  32.1   Platelets 150 - 400 K/uL 213  243  177       CMP     Component Value Date/Time   NA 138 11/19/2023 0602   K 3.6 11/19/2023 0602   CL 107 11/19/2023 0602   CO2 23 11/19/2023 0602   GLUCOSE 93 11/19/2023 0602   BUN 16 11/19/2023 0602   CREATININE 1.00 11/19/2023 0602   CALCIUM  8.6 (L) 11/19/2023 0602   PROT 6.9 11/18/2023 1433   ALBUMIN 3.9 11/18/2023 1433   AST 17 11/18/2023 1433   ALT 12 11/18/2023 1433   ALKPHOS 75 11/18/2023 1433   BILITOT 0.8 11/18/2023 1433   GFRNONAA 58 (L) 11/19/2023 0602     No results found.     Assessment & Plan:   1. Lymphedema of both lower extremities Recommend:  No surgery or intervention at this point in time.    I have reviewed my discussion with the patient regarding lymphedema and why it  causes symptoms.  Patient will continue wearing graduated compression on a daily basis. The patient should put the compression on first thing in the morning and removing them in the evening. The patient should not sleep in the compression.   In addition, behavioral modification throughout the day will be continued.  This will include frequent elevation (such as in a recliner), use of over the counter pain medications as needed and exercise such as walking.  The systemic causes for chronic edema such as liver, kidney and cardiac etiologies does not appear to have significant changed over the past year.     The patient will follow-up with  me on an annual basis.   2. Primary hypertension Continue antihypertensive medications as already ordered, these medications have been reviewed and there are no changes at this time.  3. Stage 3a chronic kidney disease (HCC) This may play a component of the patient's edema, but appears to be doing very well.   Current Outpatient Medications on File Prior to Visit  Medication Sig Dispense Refill   acetaminophen  (TYLENOL ) 650 MG CR tablet Take 1,300 mg by mouth every 8 (eight) hours as needed for pain.     aspirin  EC 81 MG tablet Take 81 mg by mouth daily. Swallow whole.     Biotin  5000 MCG TABS Take 5,000 mcg by mouth daily.     CRANBERRY-VITAMIN C  PO Take 1 tablet by mouth in the morning and at bedtime.     diclofenac  sodium (VOLTAREN ) 1 % GEL Apply 2 g topically 2 (two) times daily as needed (pain).     diclofenac  Sodium (VOLTAREN ) 1 % GEL Apply 4 g topically 4 (four) times daily.     DULoxetine  (CYMBALTA ) 30 MG capsule Take 30 mg by mouth daily.     estradiol  (ESTRACE ) 1 MG tablet Take 1 mg by mouth daily.     gabapentin  (NEURONTIN ) 100 MG capsule Take 300 mg by mouth at bedtime.     levothyroxine  (SYNTHROID ) 100 MCG tablet Take 100 mcg by mouth daily.     nystatin  cream (MYCOSTATIN ) Apply 1 Application topically 3 (three) times daily.     pantoprazole  (PROTONIX ) 40 MG tablet Take 40 mg by mouth at bedtime.     phentermine (ADIPEX-P) 37.5 MG tablet Take 37.5 mg by mouth daily.     raloxifene  (EVISTA ) 60 MG tablet Take 60 mg by mouth daily.  rosuvastatin  (CRESTOR ) 20 MG tablet Take 20 mg by mouth daily.     traMADol  (ULTRAM ) 50 MG tablet Take 1 tablet (50 mg total) by mouth every 8 (eight) hours as needed for moderate pain. 30 tablet 0   TURMERIC CURCUMIN PO Take 1 capsule by mouth daily.     No current facility-administered medications on file prior to visit.    There are no Patient Instructions on file for this visit. No follow-ups on file.   Marquasia Schmieder E Viva Gallaher, NP

## 2025-01-06 ENCOUNTER — Other Ambulatory Visit: Payer: Self-pay | Admitting: Internal Medicine

## 2025-01-06 DIAGNOSIS — R1310 Dysphagia, unspecified: Secondary | ICD-10-CM

## 2025-01-06 DIAGNOSIS — I10 Essential (primary) hypertension: Secondary | ICD-10-CM

## 2025-01-11 ENCOUNTER — Ambulatory Visit

## 2025-09-22 ENCOUNTER — Ambulatory Visit (INDEPENDENT_AMBULATORY_CARE_PROVIDER_SITE_OTHER): Admitting: Nurse Practitioner
